# Patient Record
Sex: Male | Born: 1935 | Race: White | Hispanic: No | Marital: Married | Smoking: Never smoker
Health system: Southern US, Community
[De-identification: ages and names within clinical notes are randomized; demographics above are authoritative.]

## PROBLEM LIST (undated history)

## (undated) DIAGNOSIS — R262 Difficulty in walking, not elsewhere classified: Secondary | ICD-10-CM

## (undated) DIAGNOSIS — K5900 Constipation, unspecified: Secondary | ICD-10-CM

## (undated) DIAGNOSIS — F0280 Dementia in other diseases classified elsewhere without behavioral disturbance: Secondary | ICD-10-CM

## (undated) DIAGNOSIS — I639 Cerebral infarction, unspecified: Secondary | ICD-10-CM

## (undated) DIAGNOSIS — R1310 Dysphagia, unspecified: Secondary | ICD-10-CM

## (undated) DIAGNOSIS — I350 Nonrheumatic aortic (valve) stenosis: Secondary | ICD-10-CM

## (undated) DIAGNOSIS — G629 Polyneuropathy, unspecified: Secondary | ICD-10-CM

## (undated) DIAGNOSIS — G309 Alzheimer's disease, unspecified: Secondary | ICD-10-CM

## (undated) DIAGNOSIS — N289 Disorder of kidney and ureter, unspecified: Secondary | ICD-10-CM

## (undated) DIAGNOSIS — K222 Esophageal obstruction: Secondary | ICD-10-CM

## (undated) DIAGNOSIS — F419 Anxiety disorder, unspecified: Secondary | ICD-10-CM

## (undated) DIAGNOSIS — K219 Gastro-esophageal reflux disease without esophagitis: Secondary | ICD-10-CM

## (undated) DIAGNOSIS — K5790 Diverticulosis of intestine, part unspecified, without perforation or abscess without bleeding: Secondary | ICD-10-CM

## (undated) DIAGNOSIS — K4020 Bilateral inguinal hernia, without obstruction or gangrene, not specified as recurrent: Secondary | ICD-10-CM

## (undated) DIAGNOSIS — M109 Gout, unspecified: Secondary | ICD-10-CM

## (undated) DIAGNOSIS — I10 Essential (primary) hypertension: Secondary | ICD-10-CM

## (undated) DIAGNOSIS — I739 Peripheral vascular disease, unspecified: Secondary | ICD-10-CM

## (undated) DIAGNOSIS — E785 Hyperlipidemia, unspecified: Secondary | ICD-10-CM

## (undated) DIAGNOSIS — M1990 Unspecified osteoarthritis, unspecified site: Secondary | ICD-10-CM

## (undated) DIAGNOSIS — N400 Enlarged prostate without lower urinary tract symptoms: Secondary | ICD-10-CM

## (undated) HISTORY — DX: Diverticulosis of intestine, part unspecified, without perforation or abscess without bleeding: K57.90

## (undated) HISTORY — DX: Cerebral infarction, unspecified: I63.9

## (undated) HISTORY — PX: KIDNEY STONE SURGERY: SHX686

## (undated) HISTORY — PX: OTHER SURGICAL HISTORY: SHX169

## (undated) HISTORY — DX: Esophageal obstruction: K22.2

## (undated) HISTORY — DX: Essential (primary) hypertension: I10

## (undated) HISTORY — DX: Nonrheumatic aortic (valve) stenosis: I35.0

## (undated) HISTORY — DX: Bilateral inguinal hernia, without obstruction or gangrene, not specified as recurrent: K40.20

## (undated) HISTORY — DX: Hyperlipidemia, unspecified: E78.5

## (undated) HISTORY — DX: Polyneuropathy, unspecified: G62.9

## (undated) HISTORY — DX: Peripheral vascular disease, unspecified: I73.9

## (undated) HISTORY — PX: GROIN EXPLORATION: SHX1713

## (undated) HISTORY — DX: Gastro-esophageal reflux disease without esophagitis: K21.9

## (undated) HISTORY — DX: Alzheimer's disease, unspecified: G30.9

## (undated) HISTORY — DX: Dementia in other diseases classified elsewhere, unspecified severity, without behavioral disturbance, psychotic disturbance, mood disturbance, and anxiety: F02.80

## (undated) HISTORY — DX: Gout, unspecified: M10.9

## (undated) HISTORY — PX: INGUINAL HERNIA REPAIR: SUR1180

## (undated) HISTORY — DX: Disorder of kidney and ureter, unspecified: N28.9

## (undated) HISTORY — DX: Benign prostatic hyperplasia without lower urinary tract symptoms: N40.0

---

## 2002-05-01 ENCOUNTER — Ambulatory Visit (HOSPITAL_COMMUNITY): Admission: RE | Admit: 2002-05-01 | Discharge: 2002-05-01 | Payer: Self-pay | Admitting: Ophthalmology

## 2004-05-29 ENCOUNTER — Ambulatory Visit: Payer: Self-pay | Admitting: Internal Medicine

## 2004-11-16 ENCOUNTER — Ambulatory Visit: Payer: Self-pay | Admitting: Internal Medicine

## 2004-11-26 ENCOUNTER — Ambulatory Visit: Payer: Self-pay | Admitting: Internal Medicine

## 2005-05-27 ENCOUNTER — Ambulatory Visit: Payer: Self-pay | Admitting: Internal Medicine

## 2005-10-19 ENCOUNTER — Ambulatory Visit: Payer: Self-pay | Admitting: Internal Medicine

## 2005-11-01 ENCOUNTER — Inpatient Hospital Stay (HOSPITAL_COMMUNITY): Admission: EM | Admit: 2005-11-01 | Discharge: 2005-11-03 | Payer: Self-pay | Admitting: Emergency Medicine

## 2005-11-01 ENCOUNTER — Ambulatory Visit: Payer: Self-pay | Admitting: Internal Medicine

## 2005-11-02 ENCOUNTER — Ambulatory Visit: Payer: Self-pay | Admitting: Cardiology

## 2005-11-02 ENCOUNTER — Encounter: Payer: Self-pay | Admitting: Cardiology

## 2005-11-09 ENCOUNTER — Ambulatory Visit: Payer: Self-pay | Admitting: Internal Medicine

## 2005-11-23 ENCOUNTER — Inpatient Hospital Stay (HOSPITAL_COMMUNITY): Admission: AD | Admit: 2005-11-23 | Discharge: 2005-11-25 | Payer: Self-pay | Admitting: Interventional Radiology

## 2006-01-24 ENCOUNTER — Ambulatory Visit: Payer: Self-pay | Admitting: Internal Medicine

## 2006-02-25 ENCOUNTER — Ambulatory Visit (HOSPITAL_COMMUNITY): Admission: RE | Admit: 2006-02-25 | Discharge: 2006-02-25 | Payer: Self-pay | Admitting: Interventional Radiology

## 2006-03-21 ENCOUNTER — Ambulatory Visit: Payer: Self-pay | Admitting: Internal Medicine

## 2006-03-25 ENCOUNTER — Ambulatory Visit: Payer: Self-pay | Admitting: Gastroenterology

## 2006-04-01 ENCOUNTER — Ambulatory Visit (HOSPITAL_COMMUNITY): Admission: RE | Admit: 2006-04-01 | Discharge: 2006-04-01 | Payer: Self-pay | Admitting: Gastroenterology

## 2006-04-18 ENCOUNTER — Ambulatory Visit (HOSPITAL_COMMUNITY): Admission: RE | Admit: 2006-04-18 | Discharge: 2006-04-18 | Payer: Self-pay | Admitting: Gastroenterology

## 2006-04-26 ENCOUNTER — Ambulatory Visit: Payer: Self-pay | Admitting: Internal Medicine

## 2006-06-07 ENCOUNTER — Ambulatory Visit: Payer: Self-pay | Admitting: Gastroenterology

## 2006-06-22 ENCOUNTER — Ambulatory Visit: Payer: Self-pay | Admitting: Gastroenterology

## 2006-07-22 ENCOUNTER — Ambulatory Visit: Payer: Self-pay | Admitting: Gastroenterology

## 2006-07-29 ENCOUNTER — Encounter: Admission: RE | Admit: 2006-07-29 | Discharge: 2006-07-29 | Payer: Self-pay | Admitting: Orthopedic Surgery

## 2006-08-04 ENCOUNTER — Ambulatory Visit: Payer: Self-pay | Admitting: Internal Medicine

## 2006-08-19 ENCOUNTER — Encounter: Admission: RE | Admit: 2006-08-19 | Discharge: 2006-08-19 | Payer: Self-pay | Admitting: Orthopedic Surgery

## 2007-02-07 ENCOUNTER — Ambulatory Visit: Payer: Self-pay | Admitting: Internal Medicine

## 2007-02-07 DIAGNOSIS — I739 Peripheral vascular disease, unspecified: Secondary | ICD-10-CM

## 2007-02-07 DIAGNOSIS — I10 Essential (primary) hypertension: Secondary | ICD-10-CM | POA: Insufficient documentation

## 2007-02-07 DIAGNOSIS — N259 Disorder resulting from impaired renal tubular function, unspecified: Secondary | ICD-10-CM | POA: Insufficient documentation

## 2007-02-07 DIAGNOSIS — K5730 Diverticulosis of large intestine without perforation or abscess without bleeding: Secondary | ICD-10-CM | POA: Insufficient documentation

## 2007-02-07 DIAGNOSIS — E119 Type 2 diabetes mellitus without complications: Secondary | ICD-10-CM

## 2007-02-07 DIAGNOSIS — Z8679 Personal history of other diseases of the circulatory system: Secondary | ICD-10-CM | POA: Insufficient documentation

## 2007-02-07 LAB — CONVERTED CEMR LAB
Albumin: 3.9 g/dL (ref 3.5–5.2)
CO2: 26 meq/L (ref 19–32)
Calcium: 9.2 mg/dL (ref 8.4–10.5)
Chloride: 108 meq/L (ref 96–112)
Eosinophils Absolute: 0.2 10*3/uL (ref 0.0–0.6)
GFR calc Af Amer: 53 mL/min
Glucose, Bld: 84 mg/dL (ref 70–99)
HDL: 28.9 mg/dL — ABNORMAL LOW (ref >39.0)
Hgb A1c MFr Bld: 6.6 % — ABNORMAL HIGH (ref 4.6–6.0)
Monocytes Absolute: 0.8 10*3/uL — ABNORMAL HIGH (ref 0.2–0.7)
Neutro Abs: 6.7 10*3/uL (ref 1.4–7.7)
Neutrophils Relative %: 64.6 % (ref 43.0–77.0)
RBC: 4.44 M/uL (ref 4.22–5.81)
Total Bilirubin: 1 mg/dL (ref 0.3–1.2)
VLDL: 23 mg/dL (ref 0–40)
WBC: 10.5 10*3/uL (ref 4.5–10.5)

## 2007-02-16 ENCOUNTER — Encounter: Payer: Self-pay | Admitting: Internal Medicine

## 2007-06-09 ENCOUNTER — Ambulatory Visit: Payer: Self-pay | Admitting: Internal Medicine

## 2007-06-09 DIAGNOSIS — E785 Hyperlipidemia, unspecified: Secondary | ICD-10-CM

## 2007-06-09 LAB — CONVERTED CEMR LAB
AST: 18 units/L (ref 0–37)
Albumin: 4.3 g/dL (ref 3.5–5.2)
Bilirubin, Direct: 0.2 mg/dL (ref 0.0–0.3)
CRP, High Sensitivity: <1 — ABNORMAL LOW (ref 0.00–5.00)
Calcium: 9.8 mg/dL (ref 8.4–10.5)
Cholesterol: 129 mg/dL (ref 0–200)
Creatinine, Ser: 1.7 mg/dL — ABNORMAL HIGH (ref 0.4–1.5)
GFR calc Af Amer: 49 mL/min
Glucose, Bld: 74 mg/dL (ref 70–99)
LDL Cholesterol: 73 mg/dL (ref 0–99)
Sodium: 142 meq/L (ref 135–145)
Total Bilirubin: 1 mg/dL (ref 0.3–1.2)
VLDL: 27 mg/dL (ref 0–40)

## 2007-11-17 ENCOUNTER — Encounter: Payer: Self-pay | Admitting: Interventional Radiology

## 2007-11-20 ENCOUNTER — Ambulatory Visit (HOSPITAL_COMMUNITY): Admission: RE | Admit: 2007-11-20 | Discharge: 2007-11-20 | Payer: Self-pay | Admitting: Interventional Radiology

## 2007-11-23 ENCOUNTER — Ambulatory Visit (HOSPITAL_COMMUNITY): Admission: RE | Admit: 2007-11-23 | Discharge: 2007-11-23 | Payer: Self-pay | Admitting: Interventional Radiology

## 2007-12-01 ENCOUNTER — Ambulatory Visit (HOSPITAL_COMMUNITY): Admission: RE | Admit: 2007-12-01 | Discharge: 2007-12-01 | Payer: Self-pay | Admitting: Interventional Radiology

## 2007-12-11 ENCOUNTER — Ambulatory Visit: Payer: Self-pay | Admitting: Internal Medicine

## 2007-12-11 LAB — CONVERTED CEMR LAB
CO2: 29 meq/L (ref 19–32)
Calcium: 9.8 mg/dL (ref 8.4–10.5)
Chloride: 112 meq/L (ref 96–112)
Cholesterol: 143 mg/dL (ref 0–200)
HDL: 26.9 mg/dL — ABNORMAL LOW (ref >39.0)
Hgb A1c MFr Bld: 6.5 % — ABNORMAL HIGH (ref 4.6–6.0)
Triglycerides: 144 mg/dL (ref 0–149)
VLDL: 29 mg/dL (ref 0–40)

## 2008-02-13 ENCOUNTER — Encounter: Payer: Self-pay | Admitting: Internal Medicine

## 2008-03-18 ENCOUNTER — Telehealth: Payer: Self-pay | Admitting: *Deleted

## 2008-06-07 ENCOUNTER — Ambulatory Visit: Payer: Self-pay | Admitting: Internal Medicine

## 2008-06-07 DIAGNOSIS — H3530 Unspecified macular degeneration: Secondary | ICD-10-CM | POA: Insufficient documentation

## 2008-06-07 DIAGNOSIS — R972 Elevated prostate specific antigen [PSA]: Secondary | ICD-10-CM

## 2008-06-07 LAB — CONVERTED CEMR LAB
BUN: 21 mg/dL (ref 6–23)
CO2: 28 meq/L (ref 19–32)
Calcium: 9.4 mg/dL (ref 8.4–10.5)
Cholesterol: 149 mg/dL (ref 0–200)
HDL: 44.7 mg/dL (ref >39.0)
Hgb A1c MFr Bld: 6.7 % — ABNORMAL HIGH (ref 4.6–6.0)
LDL Cholesterol: 86 mg/dL (ref 0–99)
Triglycerides: 90 mg/dL (ref 0–149)
VLDL: 18 mg/dL (ref 0–40)

## 2008-11-20 ENCOUNTER — Telehealth: Payer: Self-pay | Admitting: Gastroenterology

## 2008-11-21 ENCOUNTER — Ambulatory Visit: Payer: Self-pay | Admitting: Internal Medicine

## 2008-11-21 ENCOUNTER — Encounter: Payer: Self-pay | Admitting: Gastroenterology

## 2008-11-21 DIAGNOSIS — K219 Gastro-esophageal reflux disease without esophagitis: Secondary | ICD-10-CM

## 2008-11-21 DIAGNOSIS — Z860100 Personal history of colon polyps, unspecified: Secondary | ICD-10-CM | POA: Insufficient documentation

## 2008-11-21 DIAGNOSIS — I6350 Cerebral infarction due to unspecified occlusion or stenosis of unspecified cerebral artery: Secondary | ICD-10-CM | POA: Insufficient documentation

## 2008-11-21 DIAGNOSIS — R1319 Other dysphagia: Secondary | ICD-10-CM | POA: Insufficient documentation

## 2008-11-22 ENCOUNTER — Encounter: Payer: Self-pay | Admitting: Gastroenterology

## 2008-11-22 ENCOUNTER — Ambulatory Visit (HOSPITAL_COMMUNITY): Admission: RE | Admit: 2008-11-22 | Discharge: 2008-11-22 | Payer: Self-pay | Admitting: Internal Medicine

## 2008-11-22 ENCOUNTER — Telehealth: Payer: Self-pay | Admitting: Physician Assistant

## 2008-12-06 ENCOUNTER — Ambulatory Visit: Payer: Self-pay | Admitting: Gastroenterology

## 2008-12-06 ENCOUNTER — Ambulatory Visit: Payer: Self-pay | Admitting: Internal Medicine

## 2008-12-06 LAB — CONVERTED CEMR LAB
AST: 16 units/L (ref 0–37)
BUN: 20 mg/dL (ref 6–23)
Basophils Absolute: 0.1 10*3/uL (ref 0.0–0.1)
Basophils Relative: 1 % (ref 0.0–3.0)
Calcium: 9.4 mg/dL (ref 8.4–10.5)
Cholesterol: 179 mg/dL (ref 0–200)
Creatinine, Ser: 1.4 mg/dL (ref 0.4–1.5)
Eosinophils Absolute: 0.2 10*3/uL (ref 0.0–0.7)
Eosinophils Relative: 1.7 % (ref 0.0–5.0)
Glucose, Bld: 123 mg/dL — ABNORMAL HIGH (ref 70–99)
Lymphocytes Relative: 23.1 % (ref 12.0–46.0)
Lymphs Abs: 2.8 10*3/uL (ref 0.7–4.0)
MCHC: 34 g/dL (ref 30.0–36.0)
Monocytes Absolute: 0.9 10*3/uL (ref 0.1–1.0)
Monocytes Relative: 7.7 % (ref 3.0–12.0)
Neutro Abs: 8 10*3/uL — ABNORMAL HIGH (ref 1.4–7.7)
Platelets: 191 10*3/uL (ref 150.0–400.0)
RDW: 12.7 % (ref 11.5–14.6)
Sodium: 143 meq/L (ref 135–145)
Total Bilirubin: 0.9 mg/dL (ref 0.3–1.2)
VLDL: 15 mg/dL (ref 0.0–40.0)
WBC: 12 10*3/uL — ABNORMAL HIGH (ref 4.5–10.5)

## 2009-02-14 ENCOUNTER — Encounter: Payer: Self-pay | Admitting: Internal Medicine

## 2009-05-30 ENCOUNTER — Encounter (INDEPENDENT_AMBULATORY_CARE_PROVIDER_SITE_OTHER): Payer: Self-pay | Admitting: *Deleted

## 2009-06-16 ENCOUNTER — Telehealth: Payer: Self-pay | Admitting: Internal Medicine

## 2009-06-16 DIAGNOSIS — M545 Low back pain: Secondary | ICD-10-CM

## 2009-06-18 ENCOUNTER — Encounter (INDEPENDENT_AMBULATORY_CARE_PROVIDER_SITE_OTHER): Payer: Self-pay | Admitting: *Deleted

## 2009-06-23 ENCOUNTER — Ambulatory Visit: Payer: Self-pay | Admitting: Internal Medicine

## 2009-07-08 ENCOUNTER — Encounter: Admission: RE | Admit: 2009-07-08 | Discharge: 2009-07-08 | Payer: Self-pay | Admitting: Orthopedic Surgery

## 2009-07-15 ENCOUNTER — Ambulatory Visit: Payer: Self-pay | Admitting: Internal Medicine

## 2009-11-03 ENCOUNTER — Telehealth: Payer: Self-pay | Admitting: Internal Medicine

## 2009-11-24 ENCOUNTER — Ambulatory Visit: Payer: Self-pay | Admitting: Internal Medicine

## 2009-11-24 LAB — CONVERTED CEMR LAB
BUN: 17 mg/dL (ref 6–23)
Basophils Absolute: 0.1 10*3/uL (ref 0.0–0.1)
Chloride: 108 meq/L (ref 96–112)
Cholesterol: 146 mg/dL (ref 0–200)
Direct LDL: 85.2 mg/dL
Eosinophils Absolute: 0.3 10*3/uL (ref 0.0–0.7)
GFR calc non Af Amer: 54.8 mL/min (ref >60)
HDL: 40 mg/dL (ref >39.00)
Hemoglobin: 14.1 g/dL (ref 13.0–17.0)
Hgb A1c MFr Bld: 6.3 % (ref 4.6–6.5)
MCV: 91.8 fL (ref 78.0–100.0)
Monocytes Relative: 7.6 % (ref 3.0–12.0)
Neutrophils Relative %: 60.5 % (ref 43.0–77.0)
RBC: 4.56 M/uL (ref 4.22–5.81)
RDW: 13.4 % (ref 11.5–14.6)
TSH: 2.08 microintl units/mL (ref 0.35–5.50)
WBC: 10.2 10*3/uL (ref 4.5–10.5)

## 2009-11-24 LAB — HM DIABETES FOOT EXAM

## 2010-03-10 ENCOUNTER — Encounter: Payer: Self-pay | Admitting: Internal Medicine

## 2010-04-10 ENCOUNTER — Encounter: Payer: Self-pay | Admitting: Internal Medicine

## 2010-04-28 ENCOUNTER — Ambulatory Visit: Payer: Self-pay | Admitting: Family Medicine

## 2010-04-28 ENCOUNTER — Ambulatory Visit: Payer: Self-pay | Admitting: Internal Medicine

## 2010-04-28 ENCOUNTER — Telehealth: Payer: Self-pay | Admitting: Internal Medicine

## 2010-04-28 DIAGNOSIS — J209 Acute bronchitis, unspecified: Secondary | ICD-10-CM | POA: Insufficient documentation

## 2010-06-08 ENCOUNTER — Ambulatory Visit: Payer: Self-pay | Admitting: Internal Medicine

## 2010-06-08 DIAGNOSIS — I6322 Cerebral infarction due to unspecified occlusion or stenosis of basilar arteries: Secondary | ICD-10-CM

## 2010-06-08 LAB — CONVERTED CEMR LAB
Calcium: 9.6 mg/dL (ref 8.4–10.5)
Chloride: 106 meq/L (ref 96–112)
Direct LDL: 118.9 mg/dL
GFR calc non Af Amer: 58.35 mL/min (ref >60)
Hgb A1c MFr Bld: 6.2 % (ref 4.6–6.5)
Potassium: 4.6 meq/L (ref 3.5–5.1)
Sodium: 141 meq/L (ref 135–145)

## 2010-06-12 ENCOUNTER — Encounter: Payer: Self-pay | Admitting: Internal Medicine

## 2010-06-12 ENCOUNTER — Encounter (INDEPENDENT_AMBULATORY_CARE_PROVIDER_SITE_OTHER): Payer: Self-pay | Admitting: *Deleted

## 2010-06-12 ENCOUNTER — Telehealth (INDEPENDENT_AMBULATORY_CARE_PROVIDER_SITE_OTHER): Payer: Self-pay | Admitting: *Deleted

## 2010-06-12 ENCOUNTER — Encounter: Payer: Self-pay | Admitting: Gastroenterology

## 2010-07-02 ENCOUNTER — Encounter: Payer: Self-pay | Admitting: Gastroenterology

## 2010-07-02 ENCOUNTER — Ambulatory Visit (HOSPITAL_COMMUNITY)
Admission: RE | Admit: 2010-07-02 | Discharge: 2010-07-02 | Payer: Self-pay | Source: Home / Self Care | Attending: Gastroenterology | Admitting: Gastroenterology

## 2010-07-28 ENCOUNTER — Telehealth: Payer: Self-pay | Admitting: Internal Medicine

## 2010-07-28 ENCOUNTER — Ambulatory Visit
Admission: RE | Admit: 2010-07-28 | Discharge: 2010-07-28 | Payer: Self-pay | Source: Home / Self Care | Attending: Internal Medicine | Admitting: Internal Medicine

## 2010-07-28 DIAGNOSIS — M109 Gout, unspecified: Secondary | ICD-10-CM | POA: Insufficient documentation

## 2010-07-28 LAB — CONVERTED CEMR LAB: Blood Glucose, Fingerstick: 99

## 2010-08-16 ENCOUNTER — Encounter: Payer: Self-pay | Admitting: Interventional Radiology

## 2010-08-27 NOTE — Medication Information (Signed)
Summary: Order for Diabetic Testing Supplies  Order for Diabetic Testing Supplies   Imported By: Maryln Gottron 04/16/2010 15:10:26  _____________________________________________________________________  External Attachment:    Type:   Image     Comment:   External Document

## 2010-08-27 NOTE — Letter (Signed)
Summary: Anticoagulation/[facility]  Anticoagulation/[facility]   Imported By: Sherian Rein 06/17/2010 12:39:45  _____________________________________________________________________  External Attachment:    Type:   Image     Comment:   External Document

## 2010-08-27 NOTE — Progress Notes (Signed)
Summary: EGD  Phone Note Outgoing Call   Call placed by: Chales Abrahams CMA Duncan Dull),  June 12, 2010 9:38 AM Summary of Call: pt sceduled for EGD [facility]  reviewed  meds and instructed  pt  instructions mailed to the pt Initial call taken by: Chales Abrahams CMA Duncan Dull),  June 12, 2010 9:39 AM

## 2010-08-27 NOTE — Letter (Signed)
Summary: Diabetic Instructions  [facility]  [address] [place], [place] [address]   Phone: [phone]  Fax: [phone]    Don Montoya [birth year] MRN: [id number]   X   ORAL DIABETIC MEDICATION INSTRUCTIONS  The day before your procedure:   Take your diabetic pill as you do normally  The day of your procedure:   Do not take your diabetic pill    We will check your blood sugar levels during the admission process and again in Recovery before discharging you home  ________________________________________________________________________

## 2010-08-27 NOTE — Assessment & Plan Note (Signed)
Summary: ankle swollen and painful- cant walk/bmw   Vital Signs:  Patient profile:   77 year old male Weight:      147 pounds BP sitting:   140 / 70  (right arm) Cuff size:   regular  Vitals Entered By: Duard Brady LPN (July 28, 2010 4:08 PM) CC: c/o (R) ankle swelling - no fall no injury , also c/o coldness Is Patient Diabetic? Yes Did you bring your meter with you today? No CBG Result 99   Primary Care Provider:  Stacie Glaze MD  CC:  c/o (R) ankle swelling - no fall no injury  and also c/o coldness.  History of Present Illness: 19 and-year-old patient, who presents with a several day history of painswelling, and involving his right ankle.  There has been no trauma.  He does have a history of diabetes as well as renal insufficiency.  He has remote history of gout, but not in a number of years.  Denies any fever or chills.  For the past several days, his wife states that he has been chronically cold  Allergies (verified): No Known Drug Allergies  Past History:  Past Medical History: Reviewed history from 11/21/2008 and no changes required. Diabetes mellitus, type II Diverticulosis, colon Hypertension Peripheral vascular disease Renal insufficiency Cerebrovascular accident, hx ofS/P VERTEBRAL ARTERY STENT 2007 Hyperlipidemia GERD Constipation  Review of Systems       The patient complains of difficulty walking.  The patient denies anorexia, fever, weight loss, weight gain, vision loss, decreased hearing, hoarseness, chest pain, syncope, dyspnea on exertion, peripheral edema, prolonged cough, headaches, hemoptysis, abdominal pain, melena, hematochezia, severe indigestion/heartburn, hematuria, incontinence, genital sores, muscle weakness, suspicious skin lesions, transient blindness, depression, unusual weight change, abnormal bleeding, enlarged lymph nodes, angioedema, breast masses, and testicular masses.         recent endoscopy with esophageal  dilatation  Physical Exam  General:  elderly alert, no distress at rest.  Afebrile.  Blood pressure 140/7.  Random blood sugar 99 Extremities:  the right ankle is slightly swollen quite warm to touch and slightly tender   Impression & Recommendations:  Problem # 1:  GOUT, UNSPECIFIED (ICD-274.9)  Problem # 2:  RENAL INSUFFICIENCY (ICD-588.9)  Complete Medication List: 1)  Diabeta 2.5 Mg Tabs (Glyburide) .... One by mouth daily 2)  Adult Aspirin Low Strength 81 Mg Tbdp (Aspirin) .... One by mouth daily 3)  Plavix 75 Mg Tabs (Clopidogrel bisulfate) .... One by mouth daily 4)  Simvastatin 80 Mg Tabs (Simvastatin) .... 1/2 once daily 5)  Icaps Caps (Multiple vitamins-minerals) .Marland Kitchen.. 1 two times a day 6)  Coreg Cr 10 Mg Xr24h-cap (Carvedilol phosphate) .... One by mouth daily 7)  Omeprazole 20 Mg Cpdr (Omeprazole) .... Qd 8)  Prednisone 10 Mg Tabs (Prednisone) .... One twice daily 9)  Hydrocodone-acetaminophen 5-500 Mg Tabs (Hydrocodone-acetaminophen) .... One every 6 hours for pain  Other Orders: Capillary Blood Glucose/CBG ([address]) Prescription Created Electronically [id number])  Patient Instructions: 1)  Please schedule a follow-up appointment as needed. 2)  Limit your Sodium (Salt) to less than 2 grams a day(slightly less than 1/2 a teaspoon) to prevent fluid retention, swelling, or worsening of symptom 3)  take medications as directed Prescriptions: PREDNISONE 10 MG TABS (PREDNISONE) one twice daily  #14 x 0   Entered and Authorized by:   Gordy Savers  MD   Signed by:   Gordy Savers  MD on 07/28/2010   Method used:   Electronically to  [facility]  [address] # [id number]* (retail)       [address]       [place], [place]  [address]       Ph: [phone] or [phone]       Fax: [phone]   RxID:   [id number] HYDROCODONE-ACETAMINOPHEN 5-500 MG TABS (HYDROCODONE-ACETAMINOPHEN) one every 6 hours for pain  #30 x 0   Entered and  Authorized by:   Gordy Savers  MD   Signed by:   Gordy Savers  MD on 07/28/2010   Method used:   Print then Give to Patient   RxID:   [id number] PREDNISONE 10 MG TABS (PREDNISONE) one twice daily  #14 x 0   Entered and Authorized by:   Gordy Savers  MD   Signed by:   Gordy Savers  MD on 07/28/2010   Method used:   Print then Give to Patient   RxID:   [id number]    Orders Added: 1)  Capillary Blood Glucose/CBG [84166] 2)  Est. Patient Level III [[address]] 3)  Prescription Created Electronically [phone]

## 2010-08-27 NOTE — Procedures (Signed)
Summary: Upper Endoscopy  Patient: [name] Note: All result statuses are Final unless otherwise noted.  Tests: (1) Upper Endoscopy (EGD)   EGD Upper Endoscopy       DONE     [facility]     [address] [place], [place]  [address]           ENDOSCOPY PROCEDURE REPORT           PATIENT:  Don Montoya, Don Montoya  MR#:  [id number]     BIRTHDATE:  28-Apr-1915, 89 yrs. 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  The  endoscope was introduced through the     mouth and advanced to the second portion of the duodenum, without     limitations.  The instrument was slowly withdrawn as the mucosa     was fully examined.     <<PROCEDUREIMAGES>>           There was a thick Schatzki's type ring at GE junction above a 2cm     hiatal hernia. The ring was dilated with a 19mm CRE Balloon with     usual minor mucosal tear and self limited ooozing (see image5,     image7, and image9).  Otherwise the examination was normal (see     image2, image3, image4, and image6).    Retroflexed views revealed     no abnormalities.    The scope was then withdrawn from the patient     and the procedure completed.           COMPLICATIONS:  None           ENDOSCOPIC IMPRESSION:     1) Thick Schatzki's ring, dilated up to 19mm with CRE Balloon     2) Small hiatal hernia     3) Otherwise normal examination           RECOMMENDATIONS:     Ok to resume plavix tomorrow.     Repeat dilation as needed.        ______________________________     Rachael Fee, MD           cc: Darryll Capers, MD           n.     Rosalie Doctor:   Rachael Fee at 07/02/2010 08:27 AM           [name], [id number]  Note: An exclamation mark (!) indicates a result that was not dispersed into the flowsheet. Document Creation Date: 07/02/2010 8:28 AM _______________________________________________________________________  (1) Order result status: Final Collection or observation date-time: 07/02/2010 08:22 Requested date-time:  Receipt date-time:  Reported date-time:  Referring Physician:   Ordering Physician: Rob Bunting [phone]) Specimen Source:  Source: Launa Grill Order Number: [id number] Lab site:

## 2010-08-27 NOTE — Assessment & Plan Note (Signed)
Summary: 4 mo rov/mm/pt rescd from bump//ccm   Vital Signs:  Patient profile:   77 year old male Height:      67 inches Weight:      156 pounds BMI:     24.52 Temp:     98.2 degrees F oral Pulse rate:   76 / minute Resp:     14 per minute BP sitting:   140 / 80  (left arm)  Vitals Entered By: Willy Eddy, LPN (Nov 25, 979 10:24 AM) CC: roa, Hypertension Management  Does patient need assistance? Functional Status Self care, Social activities Ambulation Normal, Impaired:Risk for fall Comments due to mild negligence   Primary Care Provider:  Karin Golden, MD  CC:  roa and Hypertension Management.  History of Present Illness: DM follow up with goof CBG's int the 83 range blood pressure are also better at home in the 130  /70 range GERD stable eye exam up to date for macular degeneration ( and DM)  Hypertension History:      He denies headache, chest pain, palpitations, dyspnea with exertion, orthopnea, PND, peripheral edema, visual symptoms, neurologic problems, syncope, and side effects from treatment.        Positive major cardiovascular risk factors include male age 34 years old or older, diabetes, hyperlipidemia, and hypertension.  Negative major cardiovascular risk factors include non-tobacco-user status.        Positive history for target organ damage include prior stroke (or TIA) and peripheral vascular disease.     Problems Prior to Update: 1)  Low Back Pain, Acute  (ICD-724.2) 2)  Personal Hx Colonic Polyps  (ICD-V12.72) 3)  Cva-stroke  (ICD-434.91) 4)  Gerd  (ICD-530.81) 5)  Cva-stroke  (ICD-434.91) 6)  Personal Hx Colonic Polyps  (ICD-V12.72) 7)  Diverticulosis-colon  (ICD-562.10) 8)  Gerd  (ICD-530.81) 9)  Dysphagia  (ICD-787.29) 10)  Gerd  (ICD-530.81) 11)  Dysphagia Unspecified  (ICD-787.20) 12)  Elevated Prostate Specific Antigen  (ICD-790.93) 13)  Macular Degeneration of Retina Unspecified  (ICD-362.50) 14)  Hyperlipidemia  (ICD-272.4) 15)   Family History Diabetes 1st Degree Relative  (ICD-V18.0) 16)  Family History of Cad Male 1st Degree Relative <50  (ICD-V17.3) 17)  Cerebrovascular Accident, Hx of  (ICD-V12.50) 18)  Renal Insufficiency  (ICD-588.9) 19)  Peripheral Vascular Disease  (ICD-443.9) 20)  Hypertension  (ICD-401.9) 21)  Diverticulosis, Colon  (ICD-562.10) 22)  Diabetes Mellitus, Type II  (ICD-250.00)  Medications Prior to Update: 1)  Diabeta 2.5 Mg  Tabs (Glyburide) .... One By Mouth Daily 2)  Adult Aspirin Low Strength 81 Mg  Tbdp (Aspirin) .... One By Mouth Daily 3)  Plavix 75 Mg  Tabs (Clopidogrel Bisulfate) .... One By Mouth Daily 4)  Prilosec 20 Mg  Cpdr (Omeprazole) .... One By Mouth 30 Min Prior To Food Daily 5)  Simvastatin 80 Mg  Tabs (Simvastatin) .... 1/2 Once Daily 6)  Icaps  Caps (Multiple Vitamins-Minerals) .Marland Kitchen.. 1 Two Times A Day 7)  Prednisone 10 Mg Tabs (Prednisone) .... Dose Pack- Take As Directed 8)  Tramadol Hcl 50 Mg Tabs (Tramadol Hcl) .... One Two Two By Mouth Every 6 Hours As Needed For Pain 9)  Diazepam 2 Mg Tabs (Diazepam) .... One By Mouth Three Times A Day As Need For Back Pain and Anxiety 10)  Coreg Cr 10 Mg Xr24h-Cap (Carvedilol Phosphate) .... One By Mouth Daily 11)  Zithromax Z-Pak 250 Mg Tabs (Azithromycin) .... As Directed 12)  Atuss Ds 30-4-30 Mg/15ml Susp (Pseudoephed Hcl-Cpm-Dm Hbr  Tan) .... 2 Teaspoons By Mouth Bid  Current Medications (verified): 1)  Diabeta 2.5 Mg  Tabs (Glyburide) .... One By Mouth Daily 2)  Adult Aspirin Low Strength 81 Mg  Tbdp (Aspirin) .... One By Mouth Daily 3)  Plavix 75 Mg  Tabs (Clopidogrel Bisulfate) .... One By Mouth Daily 4)  Prilosec 20 Mg  Cpdr (Omeprazole) .... One By Mouth 30 Min Prior To Food Daily 5)  Simvastatin 80 Mg  Tabs (Simvastatin) .... 1/2 Once Daily 6)  Icaps  Caps (Multiple Vitamins-Minerals) .Marland Kitchen.. 1 Two Times A Day 7)  Coreg Cr 10 Mg Xr24h-Cap (Carvedilol Phosphate) .... One By Mouth Daily  Allergies (verified): No Known Drug  Allergies  Past History:  Family History: Last updated: 02/07/2007 Family History of CAD Male 1st degree relative <50 Family History Diabetes 1st degree relative  Social History: Last updated: 11/21/2008 Retired Married Never Smoked Daily Caffeine Use-2 Illicit Drug Use - no  Risk Factors: Smoking Status: never (07/15/2009)  Past medical, surgical, family and social histories (including risk factors) reviewed, and no changes noted (except as noted below).  Past Medical History: Reviewed history from 11/21/2008 and no changes required. Diabetes mellitus, type II Diverticulosis, colon Hypertension Peripheral vascular disease Renal insufficiency Cerebrovascular accident, hx ofS/P VERTEBRAL ARTERY STENT 2007 Hyperlipidemia GERD Constipation  Past Surgical History: Reviewed history from 02/07/2007 and no changes required. GU surgery kidney stones vertibral artery stent  Family History: Reviewed history from 02/07/2007 and no changes required. Family History of CAD Male 1st degree relative <50 Family History Diabetes 1st degree relative  Social History: Reviewed history from 11/21/2008 and no changes required. Retired Married Never Smoked Daily Caffeine Use-2 Illicit Drug Use - no  Review of Systems  The patient denies anorexia, fever, weight loss, weight gain, vision loss, decreased hearing, hoarseness, chest pain, syncope, dyspnea on exertion, peripheral edema, prolonged cough, headaches, hemoptysis, abdominal pain, melena, hematochezia, severe indigestion/heartburn, hematuria, incontinence, genital sores, muscle weakness, suspicious skin lesions, transient blindness, difficulty walking, depression, unusual weight change, abnormal bleeding, enlarged lymph nodes, angioedema, and breast masses.    Physical Exam  General:  alert and well-developed.   Head:  Normocephalic and atraumatic.male-pattern balding.   Eyes:  pupils equal and pupils round.   Ears:  R ear  normal and L ear normal.   Nose:  no nasal discharge.   Neck:  No deformities, masses, or tenderness noted. Lungs:  Clear throughout to auscultation. Heart:  normal rate, regular rhythm, and Grade  1 /6 systolic ejection murmur.   Abdomen:  soft.  MODERATLY TENDER WITH GOOD BS  Diabetes Management Exam:    Foot Exam (with socks and/or shoes not present):       Sensory-Pinprick/Light touch:          Left medial foot (L-4): diminished          Left dorsal foot (L-5): diminished          Left lateral foot (S-1): diminished          Right medial foot (L-4): diminished          Right dorsal foot (L-5): diminished          Right lateral foot (S-1): diminished    Eye Exam:       Eye Exam done elsewhere          Date: 09/23/2009          Results: normal          Done by: rankin/groat  Impression & Recommendations:  Problem # 1:  CVA-STROKE (ICD-434.91) on plavie with good blood presure control His updated medication list for this problem includes:    Adult Aspirin Low Strength 81 Mg Tbdp (Aspirin) ..... One by mouth daily    Plavix 75 Mg Tabs (Clopidogrel bisulfate) ..... One by mouth daily  Problem # 2:  HYPERLIPIDEMIA (ICD-272.4)  due lipids His updated medication list for this problem includes:    Simvastatin 80 Mg Tabs (Simvastatin) .Marland Kitchen... 1/2 once daily  Labs Reviewed: SGOT: 16 (12/06/2008)   SGPT: 9 (12/06/2008)  10 Yr Risk Heart Disease: 47 % Prior 10 Yr Risk Heart Disease: 40 % (07/15/2009)   HDL:39.20 (12/06/2008), 44.7 (06/07/2008)  LDL:125 (12/06/2008), 86 (16/04/9603)  Chol:179 (12/06/2008), 149 (06/07/2008)  Trig:75.0 (12/06/2008), 90 (06/07/2008)  Orders: TLB-Cholesterol, HDL (83718-HDL) TLB-Cholesterol, Direct LDL (83721-DIRLDL) TLB-Cholesterol, Total (82465-CHO) TLB-TSH (Thyroid Stimulating Hormone) (84443-TSH)  Complete Medication List: 1)  Diabeta 2.5 Mg Tabs (Glyburide) .... One by mouth daily 2)  Adult Aspirin Low Strength 81 Mg Tbdp (Aspirin) .... One  by mouth daily 3)  Plavix 75 Mg Tabs (Clopidogrel bisulfate) .... One by mouth daily 4)  Simvastatin 80 Mg Tabs (Simvastatin) .... 1/2 once daily 5)  Icaps Caps (Multiple vitamins-minerals) .Marland Kitchen.. 1 two times a day 6)  Coreg Cr 10 Mg Xr24h-cap (Carvedilol phosphate) .... One by mouth daily  Other Orders: Venipuncture ([address]) TLB-BMP (Basic Metabolic Panel-BMET) (80048-METABOL) TLB-A1C / Hgb A1C (Glycohemoglobin) (83036-A1C) TLB-CBC Platelet - w/Differential (85025-CBCD) Prescription Created Electronically [id number])  Hypertension Assessment/Plan:      The patient's hypertensive risk group is category C: Target organ damage and/or diabetes.  His calculated 10 year risk of coronary heart disease is 47 %.  Today's blood pressure is 140/80.  His blood pressure goal is < 130/80.  Patient Instructions: 1)  Please schedule a follow-up appointment in 6 months. Prescriptions: COREG CR 10 MG XR24H-CAP (CARVEDILOL PHOSPHATE) one by mouth daily  #30 x 6   Entered and Authorized by:   Stacie Glaze MD   Signed by:   Stacie Glaze MD on 11/24/2009   Method used:   Electronically to        [facility]  [address] # 11350* (retail)       [address]       [place], [place]  [address]       Ph: [phone] or [phone]       Fax: [phone]   RxID:   [id number]

## 2010-08-27 NOTE — Progress Notes (Signed)
Summary: REQ FOR RX  Phone Note Call from Patient   Caller: Spouse Britta Mccreedy) [phone] Reason for Call: Talk to Nurse, Talk to Doctor Summary of Call: Pts wife Britta Mccreedy) called in to req that a Rx be sent in for her husband.... Pt c/o chest congestion, productive cough x 3 wks.Marland KitchenMarland KitchenMarland KitchenPt is exp fatigue due to ongoing cough.... Pt has been taking OTC meds (Robitussin) w/ little relief.... Pts wife is concerned that sxs persist then it may turn into pneumonia... Pts wife was offered OV for pt, but she said that the pt is scheduled to come in at the first of May... Pts wife adv that Rx can be sent to [facility] [address] - [place], [place].  Pts wife can be reached at [phone] with any questions or concerns.  Initial call taken by: Debbra Riding,  November 03, 2009 9:12 AM  Follow-up for Phone Call        zpack and atuss two tsp by mouth two times a day  Follow-up by: Stacie Glaze MD,  November 03, 2009 1:41 PM  Additional Follow-up for Phone Call Additional follow up Details #1::        Phone Call Completed Additional Follow-up by: Rudy Jew, RN,  November 03, 2009 1:57 PM    New/Updated Medications: ZITHROMAX Z-PAK 250 MG TABS (AZITHROMYCIN) As directed ATUSS DS 30-4-30 MG/5ML SUSP (PSEUDOEPHED HCL-CPM-DM HBR TAN) 2 teaspoons by mouth bid Prescriptions: ATUSS DS 30-4-30 MG/5ML SUSP (PSEUDOEPHED HCL-CPM-DM HBR TAN) 2 teaspoons by mouth bid  #6 ounces x 0   Entered by:   Rudy Jew, RN   Authorized by:   Stacie Glaze MD   Signed by:   Rudy Jew, RN on 11/03/2009   Method used:   Telephoned to ...       [facility]  [address] # [phone]* (retail)       [phone]       [place], [place]  [address]       Ph: [phone]       Fax: [phone]   RxID:   [id number] ZITHROMAX Z-PAK 250 MG TABS (AZITHROMYCIN) As directed  #1 x 0   Entered by:   Rudy Jew, RN   Authorized by:   Stacie Glaze MD   Signed by:   Rudy Jew, RN on 11/03/2009   Method used:   Telephoned to ...       [facility]  [address] # [phone]* (retail)       [phone] [address]       [place], [place]  [address]       Ph: [phone]       Fax: [phone]   RxID:   [id number]

## 2010-08-27 NOTE — Assessment & Plan Note (Signed)
Summary: uri/bmw   Vital Signs:  Patient profile:   77 year old male Height:      67 inches (170.18 cm) Weight:      150 pounds (68.18 kg) O2 Sat:      97 % on Room air Temp:     98.4 degrees F (36.89 degrees C) oral Pulse rate:   90 / minute BP sitting:   138 / 74  (left arm) Cuff size:   regular  Vitals Entered By: Josph Macho RMA (April 28, 2010 12:05 PM)  O2 Flow:  Room air CC: cough X5 days/ flu vaccination today/ CF Is Patient Diabetic? Yes   History of Present Illness: patient is a 77 year old Caucasian male in today with his wife for evaluation of the report he is generally healthy and in about 5 days ago began to have a sore throat. Over the next couple of days he developed worsening dry cough, malaise, anorexia, nasal congestion productive of clear sputum. He also notes some postnasal drip and some worsening chest tightness. I more trouble sleeping and his breathing is shallow more at night. During the day no wheezing but some shortness of breath with exertion is noted. No fevers, chills, headache, ear pain, palpitations, GI or GU complaints except for some mild anorexia  Current Medications (verified): 1)  Diabeta 2.5 Mg  Tabs (Glyburide) .... One By Mouth Daily 2)  Adult Aspirin Low Strength 81 Mg  Tbdp (Aspirin) .... One By Mouth Daily 3)  Plavix 75 Mg  Tabs (Clopidogrel Bisulfate) .... One By Mouth Daily 4)  Simvastatin 80 Mg  Tabs (Simvastatin) .... 1/2 Once Daily 5)  Icaps  Caps (Multiple Vitamins-Minerals) .Marland Kitchen.. 1 Two Times A Day 6)  Coreg Cr 10 Mg Xr24h-Cap (Carvedilol Phosphate) .... One By Mouth Daily  Allergies (verified): No Known Drug Allergies  Past History:  Past medical history reviewed for relevance to current acute and chronic problems. Social history (including risk factors) reviewed for relevance to current acute and chronic problems.  Past Medical History: Reviewed history from 11/21/2008 and no changes required. Diabetes mellitus, type  II Diverticulosis, colon Hypertension Peripheral vascular disease Renal insufficiency Cerebrovascular accident, hx ofS/P VERTEBRAL ARTERY STENT 2007 Hyperlipidemia GERD Constipation  Social History: Reviewed history from 11/21/2008 and no changes required. Retired Married Never Smoked Daily Caffeine Use-2 Illicit Drug Use - no  Review of Systems      See HPI       Flu Vaccine Consent Questions     Do you have a history of severe allergic reactions to this vaccine? no    Any prior history of allergic reactions to egg and/or gelatin? no    Do you have a sensitivity to the preservative Thimersol? no    Do you have a past history of Guillan-Barre Syndrome? no    Do you currently have an acute febrile illness? no    Have you ever had a severe reaction to latex? no    Vaccine information given and explained to patient? yes    Are you currently pregnant? no    Lot Number:[id number]   Exp Date:01/23/2011   Site Given  Left Deltoid IM Josph Macho RMA  April 28, 2010 12:21 PM    Physical Exam  General:  Well-developed,well-nourished,in no acute distress; alert,appropriate and cooperative throughout examination Head:  Normocephalic and atraumatic without obvious abnormalities. No apparent alopecia or balding. Ears:  External ear exam shows no significant lesions or deformities.  Otoscopic examination reveals clear canals,  tympanic membranes are intact bilaterally without bulging, retraction, inflammation or discharge. Hearing is grossly normal bilaterally. Nose:  External nasal examination shows no deformity or inflammation. Nasal mucosa are pink and moist without lesions or exudates. Mouth:  Oral mucosa and oropharynx without lesions or exudates.  Teeth in good repair. Mild erythema in posterior oropharynx Neck:  No deformities, masses, or tenderness noted. Lungs:  b/l rhonchi, decreased BS at right base Heart:  Normal rate and regular rhythm. S1 and S2 normal without gallop,  murmur, click, rub or other extra sounds. Abdomen:  Bowel sounds positive,abdomen soft and non-tender without masses, organomegaly or hernias noted. Extremities:  No clubbing, cyanosis, edema, or deformity noted  Cervical Nodes:  No lymphadenopathy noted Psych:  Cognition and judgment appear intact. Alert and cooperative with normal attention span and concentration. No apparent delusions, illusions, hallucinations   Impression & Recommendations:  Problem # 1:  ACUTE BRONCHITIS (ICD-466.0)  His updated medication list for this problem includes:    Cefdinir 300 Mg Caps (Cefdinir) .Marland Kitchen... 1 cap by mouth two times a day x 10 days    Mucinex 600 Mg Xr12h-tab (Guaifenesin) .Marland Kitchen... 1 tab by mouth two times a day x 10 days    Tessalon Perles 100 Mg Caps (Benzonatate) .Marland Kitchen... 1 cap by mouth three times a day as needed cough    Proair Hfa 108 (90 Base) Mcg/act Aers (Albuterol sulfate) .Marland Kitchen... 2 puffs by mouth q 4 hours as needed cough/wheeze/sob  Orders: T-2 View CXR (71020TC)  Problem # 2:  GERD (ICD-530.81) No c/o at today's no changes  Complete Medication List: 1)  Diabeta 2.5 Mg Tabs (Glyburide) .... One by mouth daily 2)  Adult Aspirin Low Strength 81 Mg Tbdp (Aspirin) .... One by mouth daily 3)  Plavix 75 Mg Tabs (Clopidogrel bisulfate) .... One by mouth daily 4)  Simvastatin 80 Mg Tabs (Simvastatin) .... 1/2 once daily 5)  Icaps Caps (Multiple vitamins-minerals) .Marland Kitchen.. 1 two times a day 6)  Coreg Cr 10 Mg Xr24h-cap (Carvedilol phosphate) .... One by mouth daily 7)  Cefdinir 300 Mg Caps (Cefdinir) .Marland Kitchen.. 1 cap by mouth two times a day x 10 days 8)  Cefdinir 300 Mg Caps (Cefdinir) .Marland Kitchen.. 1 cap by mouth two times a day x 10 days 9)  Mucinex 600 Mg Xr12h-tab (Guaifenesin) .Marland Kitchen.. 1 tab by mouth two times a day x 10 days 10)  Tessalon Perles 100 Mg Caps (Benzonatate) .Marland Kitchen.. 1 cap by mouth three times a day as needed cough 11)  Proair Hfa 108 (90 Base) Mcg/act Aers (Albuterol sulfate) .... 2 puffs by mouth q 4  hours as needed cough/wheeze/sob  Other Orders: Flu Vaccine 57yrs + MEDICARE PATIENTS (Z6109) Administration Flu vaccine - MCR (U0454)  Patient Instructions: 1)  Take your antibiotic as prescribed until ALL of it is gone, but stop if you develop a rash or swelling and contact our office as soon as possible.  2)  Acute Bronchitis symptoms for less then 10 days are not  helped by antibiotics. Take over the counter cough medications. Call if no improvement in 5-7 days, sooner if increasing cough, fever, or new symptoms ( shortness of breath, chest pain) .  3)  Please schedule a follow-up appointment as needed if symptoms worsen or do not resolve. 4)  Take a yogurt such as Activia or a probiotic such as Align caps daily while on antibiotics Prescriptions: PROAIR HFA 108 (90 BASE) MCG/ACT AERS (ALBUTEROL SULFATE) 2 puffs by mouth q 4 hours as needed  cough/wheeze/SOB  #1 hfa x 1   Entered and Authorized by:   Danise Edge MD   Signed by:   Danise Edge MD on 04/28/2010   Method used:   Electronically to        [facility] # [phone]* (retail)       [address]       [place], [place]  [address]       Ph: [phone]       Fax: [phone]   RxID:   [id number] TESSALON PERLES 100 MG CAPS (BENZONATATE) 1 cap by mouth three times a day as needed cough  #30 x 0   Entered and Authorized by:   Danise Edge MD   Signed by:   Danise Edge MD on 04/28/2010   Method used:   Electronically to        [facility] # [phone]* (retail)       [address]       [place], [place]  [address]       Ph: [phone]       Fax: [phone]   RxID:   [id number] CEFDINIR 300 MG CAPS (CEFDINIR) 1 cap by mouth two times a day x 10 days  #20 x 0   Entered and Authorized by:   Danise Edge MD   Signed by:   Danise Edge MD on 04/28/2010   Method used:   Electronically to        [facility] # [id number]* (retail)       [address]        [place], [place]  [address]       Ph: [phone]       Fax: [phone]   RxID:   [id number]

## 2010-08-27 NOTE — Letter (Signed)
Summary: Anticoagulation Modification Letter  [facility]  [address] [place], [place] [address]   Phone: [phone]  Fax: [phone]    June 12, 2010  Re:    Don Montoya DOB:    1914/12/13 MRN:    [id number]    Dear Dr Lovell Sheehan,  We have scheduled the above patient for an endoscopic procedure. Our records show that  he/she is on anticoagulation therapy. Please advise as to how long the patient may come off their therapy of coumadin prior to the scheduled procedure.   Please fax back/or route the completed form to Patty at [phone].  Thank you for your help with this matter.  Sincerely,  Chales Abrahams CMA Duncan Dull)   Physician Recommendation:  Hold Plavix 7 days prior ________________  Hold Coumadin 5 days prior ____________  Other ______________________________     Appended Document: Anticoagulation Modification Letter Pt letter should read Plavix not Coumadin  Appended Document: Anticoagulation Modification Letter pt aware letter scanned to EMR  Appended Document: Anticoagulation Modification Letter may hold plavix for 7 days prior

## 2010-08-27 NOTE — Assessment & Plan Note (Signed)
Summary: fup/njr   Vital Signs:  Patient profile:   77 year old male Weight:      149 pounds Temp:     97.3 degrees F oral Pulse rate:   100 / minute Pulse rhythm:   regular Resp:     16 per minute BP sitting:   140 / 88  Vitals Entered By: Lynann Beaver CMA AAMA (June 08, 2010 10:48 AM) CC: rov Is Patient Diabetic? Yes Did you bring your meter with you today? No Pain Assessment Patient in pain? no        Primary Care Lai Hendriks:  Stacie Glaze MD  CC:  rov.  History of Present Illness: mild asbestosis seen on the xray will need PFTS next OV has noted some mild dysphagia with solids Note the consult in april for consideration for dilation The process was delayed by an [place]  for AAA that showed mild dlation (2.5cm) and he never did go back for the salivary dilation He is on plavix for basilar artery stenosis/ blockage BP stable DM, not checking due to meter    Problems Prior to Update: 1)  Occlusion&stenosis Basilar Artery W/infarct  (ICD-433.01) 2)  Acute Bronchitis  (ICD-466.0) 3)  Low Back Pain, Acute  (ICD-724.2) 4)  Personal Hx Colonic Polyps  (ICD-V12.72) 5)  Cva-stroke  (ICD-434.91) 6)  Gerd  (ICD-530.81) 7)  Cva-stroke  (ICD-434.91) 8)  Personal Hx Colonic Polyps  (ICD-V12.72) 9)  Diverticulosis-colon  (ICD-562.10) 10)  Gerd  (ICD-530.81) 11)  Dysphagia  (ICD-787.29) 12)  Gerd  (ICD-530.81) 13)  Elevated Prostate Specific Antigen  (ICD-790.93) 14)  Macular Degeneration of Retina Unspecified  (ICD-362.50) 15)  Hyperlipidemia  (ICD-272.4) 16)  Family History Diabetes 1st Degree Relative  (ICD-V18.0) 17)  Family History of Cad Male 1st Degree Relative <50  (ICD-V17.3) 18)  Cerebrovascular Accident, Hx of  (ICD-V12.50) 19)  Renal Insufficiency  (ICD-588.9) 20)  Peripheral Vascular Disease  (ICD-443.9) 21)  Hypertension  (ICD-401.9) 22)  Diverticulosis, Colon  (ICD-562.10) 23)  Diabetes Mellitus, Type II  (ICD-250.00)  Medications Prior to  Update: 1)  Diabeta 2.5 Mg  Tabs (Glyburide) .... One By Mouth Daily 2)  Adult Aspirin Low Strength 81 Mg  Tbdp (Aspirin) .... One By Mouth Daily 3)  Plavix 75 Mg  Tabs (Clopidogrel Bisulfate) .... One By Mouth Daily 4)  Simvastatin 80 Mg  Tabs (Simvastatin) .... 1/2 Once Daily 5)  Icaps  Caps (Multiple Vitamins-Minerals) .Marland Kitchen.. 1 Two Times A Day 6)  Coreg Cr 10 Mg Xr24h-Cap (Carvedilol Phosphate) .... One By Mouth Daily 7)  Cefdinir 300 Mg Caps (Cefdinir) .Marland Kitchen.. 1 Cap By Mouth Two Times A Day X 10 Days 8)  Cefdinir 300 Mg Caps (Cefdinir) .Marland Kitchen.. 1 Cap By Mouth Two Times A Day X 10 Days 9)  Mucinex 600 Mg Xr12h-Tab (Guaifenesin) .Marland Kitchen.. 1 Tab By Mouth Two Times A Day X 10 Days 10)  Tessalon Perles 100 Mg Caps (Benzonatate) .Marland Kitchen.. 1 Cap By Mouth Three Times A Day As Needed Cough 11)  Proair Hfa 108 (90 Base) Mcg/act Aers (Albuterol Sulfate) .... 2 Puffs By Mouth Q 4 Hours As Needed Cough/wheeze/sob  Current Medications (verified): 1)  Diabeta 2.5 Mg  Tabs (Glyburide) .... One By Mouth Daily 2)  Adult Aspirin Low Strength 81 Mg  Tbdp (Aspirin) .... One By Mouth Daily 3)  Plavix 75 Mg  Tabs (Clopidogrel Bisulfate) .... One By Mouth Daily 4)  Simvastatin 80 Mg  Tabs (Simvastatin) .... 1/2 Once Daily 5)  Icaps  Caps (Multiple Vitamins-Minerals) .Marland Kitchen.. 1 Two Times A Day 6)  Coreg Cr 10 Mg Xr24h-Cap (Carvedilol Phosphate) .... One By Mouth Daily  Allergies (verified): No Known Drug Allergies  Past History:  Family History: Last updated: 02/07/2007 Family History of CAD Male 1st degree relative <50 Family History Diabetes 1st degree relative  Social History: Last updated: 11/21/2008 Retired Married Never Smoked Daily Caffeine Use-2 Illicit Drug Use - no  Risk Factors: Smoking Status: never (07/15/2009)  Past medical, surgical, family and social histories (including risk factors) reviewed, and no changes noted (except as noted below).  Past Medical History: Reviewed history from 11/21/2008 and  no changes required. Diabetes mellitus, type II Diverticulosis, colon Hypertension Peripheral vascular disease Renal insufficiency Cerebrovascular accident, hx ofS/P VERTEBRAL ARTERY STENT 2007 Hyperlipidemia GERD Constipation  Past Surgical History: Reviewed history from 02/07/2007 and no changes required. GU surgery kidney stones vertibral artery stent  Family History: Reviewed history from 02/07/2007 and no changes required. Family History of CAD Male 1st degree relative <50 Family History Diabetes 1st degree relative  Social History: Reviewed history from 11/21/2008 and no changes required. Retired Married Never Smoked Daily Caffeine Use-2 Illicit Drug Use - no  Review of Systems       The patient complains of weight loss, decreased hearing, and hoarseness.  The patient denies anorexia, fever, weight gain, vision loss, chest pain, syncope, dyspnea on exertion, peripheral edema, prolonged cough, headaches, hemoptysis, abdominal pain, melena, hematochezia, severe indigestion/heartburn, hematuria, incontinence, genital sores, muscle weakness, suspicious skin lesions, transient blindness, difficulty walking, depression, unusual weight change, abnormal bleeding, enlarged lymph nodes, angioedema, breast masses, and testicular masses.    Physical Exam  General:  alert and underweight appearing.   Head:  normocephalic and male-pattern balding.   Eyes:  pupils equal and pupils round.   Ears:  R ear normal and L ear normal.  marked hearing loss Nose:  no external deformity and no nasal discharge.   Neck:  No deformities, masses, or tenderness noted. Lungs:  normal respiratory effort and no wheezes.   Heart:  Normal rate and regular rhythm. S1 and S2 normal without gallop, murmur, click, rub or other extra sounds. Abdomen:  soft, non-tender, normal bowel sounds, and no masses.   Extremities:  No clubbing, cyanosis, edema, or deformity noted  Neurologic:  alert & oriented X3 and  finger-to-nose normal.     Impression & Recommendations:  Problem # 1:  CVA-STROKE (ICD-434.91)  on plavix  due to baslilar artery stenosis His updated medication list for this problem includes:    Adult Aspirin Low Strength 81 Mg Tbdp (Aspirin) ..... One by mouth daily    Plavix 75 Mg Tabs (Clopidogrel bisulfate) ..... One by mouth daily  Orders: TLB-Cholesterol, HDL (83718-HDL) TLB-Cholesterol, Direct LDL (83721-DIRLDL) TLB-Cholesterol, Total (82465-CHO) Specimen Handling (62952)  Problem # 2:  OCCLUSION&STENOSIS BASILAR ARTERY W/INFARCT (ICD-433.01) on plavix... but can hold for procedure His updated medication list for this problem includes:    Adult Aspirin Low Strength 81 Mg Tbdp (Aspirin) ..... One by mouth daily    Plavix 75 Mg Tabs (Clopidogrel bisulfate) ..... One by mouth daily  Problem # 3:  DYSPHAGIA (ICD-787.29) needs to cosider the dilaton  will refer back to GI if a second dilation does  not work would order swallowing study to r/o dysfunction  Problem # 4:  PERIPHERAL VASCULAR DISEASE (ICD-443.9) has 2.5 cm AAA stable repeart needed in May 2012  Problem # 5:  HYPERTENSION (ICD-401.9)  His updated medication list for  this problem includes:    Coreg Cr 10 Mg Xr24h-cap (Carvedilol phosphate) ..... One by mouth daily repeat 140/88 BP today: 172/100 Prior BP: 138/74 (04/28/2010)  Prior 10 Yr Risk Heart Disease: 47 % (11/24/2009)  Labs Reviewed: K+: 4.2 (11/24/2009) Creat: : 1.3 (11/24/2009)   Chol: 146 (11/24/2009)   HDL: 40.00 (11/24/2009)   LDL: 125 (12/06/2008)   TG: 75.0 (12/06/2008)  Problem # 6:  DIABETES MELLITUS, TYPE II (ICD-250.00)  new meter and check A1c His updated medication list for this problem includes:    Diabeta 2.5 Mg Tabs (Glyburide) ..... One by mouth daily    Adult Aspirin Low Strength 81 Mg Tbdp (Aspirin) ..... One by mouth daily  Labs Reviewed: Creat: 1.3 (11/24/2009)     Last Eye Exam: normal (09/23/2009) Reviewed HgBA1c  results: 6.3 (11/24/2009)  6.7 (06/07/2008)  Orders: Venipuncture (40981) TLB-BMP (Basic Metabolic Panel-BMET) (80048-METABOL) TLB-A1C / Hgb A1C (Glycohemoglobin) (83036-A1C) Specimen Handling (19147)  Complete Medication List: 1)  Diabeta 2.5 Mg Tabs (Glyburide) .... One by mouth daily 2)  Adult Aspirin Low Strength 81 Mg Tbdp (Aspirin) .... One by mouth daily 3)  Plavix 75 Mg Tabs (Clopidogrel bisulfate) .... One by mouth daily 4)  Simvastatin 80 Mg Tabs (Simvastatin) .... 1/2 once daily 5)  Icaps Caps (Multiple vitamins-minerals) .Marland Kitchen.. 1 two times a day 6)  Coreg Cr 10 Mg Xr24h-cap (Carvedilol phosphate) .... One by mouth daily  Patient Instructions: 1)  Please schedule a follow-up appointment in 3 months.   Orders Added: 1)  Venipuncture [36415] 2)  TLB-BMP (Basic Metabolic Panel-BMET) [80048-METABOL] 3)  TLB-A1C / Hgb A1C (Glycohemoglobin) [83036-A1C] 4)  TLB-Cholesterol, HDL [83718-HDL] 5)  TLB-Cholesterol, Direct LDL [83721-DIRLDL] 6)  TLB-Cholesterol, Total [82465-CHO] 7)  Specimen Handling [99000] 8)  Est. Patient Level IV [82956]     ANTICOAGULATION RECORD  NEW REGIMEN & LAB RESULTS Regimen:   (no change)

## 2010-08-27 NOTE — Progress Notes (Signed)
Summary: ankle swelling, weakness, chills  Phone Note Call from Patient Call back at Home Phone [phone]   Caller: Spouse Reason for Call: Acute Illness, Talk to Nurse Summary of Call: Wife is calling because right ankle swelling with pain.  He is also having abd discomfort, with nausea, weakness, and chills.  Onset 07/16/10.  The abdominal symptoms have improved but still has chills and ankle swelling, weakness.   Initial call taken by: Kern Reap CMA Duncan Dull),  July 28, 2010 10:25 AM  Follow-up for Phone Call        offered ov with dr j for thursday but insistshe be seen today Follow-up by: Willy Eddy, LPN,  July 28, 2010 11:46 AM

## 2010-08-27 NOTE — Progress Notes (Signed)
Summary: EGD [facility]   ---- Converted from flag ---- ---- 06/09/2010 1:42 PM, Chales Abrahams CMA (AAMA) wrote:   ---- 06/08/2010 6:49 PM, Rachael Fee MD wrote: he told his pcp he was having persistent mild, dysphagia.   Dr. Lovell Sheehan said it was OK to hold his plavix 5 days, can we set him up for EGD, balloon dilation at [facility] during my next EUS day (not this week).  Still have anticoag letter sent. thanks ------------------------------

## 2010-08-27 NOTE — Letter (Signed)
Summary: EGD Instructions  [facility]  [address] [place], [place] [address]   Phone: [phone]  Fax: [phone]       Don Montoya    30-Aug-1914    MRN: [id number]       Procedure Day /Date: 07/02/10 THURS     Arrival Time: 7 am     Procedure Time:8 am     Location of Procedure:                     [name] ( Out[facility])    PREPARATION FOR ENDOSCOPY   On 07/02/10  THE DAY OF THE PROCEDURE:  1.   No solid foods, milk or milk products are allowed after midnight the night before your procedure.  2.   Do not drink anything colored red or purple.  Avoid juices with pulp.  No orange juice.  3.  You may drink clear liquids until 4 am , which is 4 hours before your procedure.                                                                                                CLEAR LIQUIDS INCLUDE: Water Jello Ice Popsicles Tea (sugar ok, no milk/cream) Powdered fruit flavored drinks Coffee (sugar ok, no milk/cream) Gatorade Juice: apple, white grape, white cranberry  Lemonade Clear bullion, consomm, broth Carbonated beverages (any kind) Strained chicken noodle soup Hard Candy   MEDICATION INSTRUCTIONS  Unless otherwise instructed, you should take regular prescription medications with a small sip of water as early as possible the morning of your procedure.  Diabetic patients - see separate instructions.  Stop taking Plavix 06/27/10               OTHER INSTRUCTIONS  You will need a responsible adult at least 75 years of age to accompany you and drive you home.   This person must remain in the waiting room during your procedure.  Wear loose fitting clothing that is easily removed.  Leave jewelry and other valuables at home.  However, you may wish to bring a book to read or an iPod/MP3 player to listen to music as you wait for your procedure to start.  Remove all body piercing jewelry and leave at home.  Total time  from sign-in until discharge is approximately 2-3 hours.  You should go home directly after your procedure and rest.  You can resume normal activities the day after your procedure.  The day of your procedure you should not:   Drive   Make legal decisions   Operate machinery   Drink alcohol   Return to work  You will receive specific instructions about eating, activities and medications before you leave.    The above instructions have been reviewed and explained to me by   Chales Abrahams CMA Duncan Dull)  June 12, 2010 9:35 AM     I fully understand and can verbalize these instructions over the phone mailed to home Date  06/12/10

## 2010-08-27 NOTE — Miscellaneous (Signed)
Summary: Orders Update  Clinical Lists Changes       Diabetes Management Exam:    Eye Exam:       Eye Exam done elsewhere          Date: 03/06/2010          Results: normal          Done by: dr Dione Booze  Diabetes Management Assessment/Plan:      His blood pressure goal is < 130/80.

## 2010-08-27 NOTE — Progress Notes (Signed)
Summary: sore throat/cough  Phone Note Call from Patient Call back at Home Phone [phone] Call back at Work Phone    Caller: Spouse Call For: Stacie Glaze MD Reason for Call: Acute Illness, Refill Medication Complaint: Cough/Sore throat Action Taken: Provider Notified Summary of Call: Wife is calling to let Dr. Lovell Sheehan know that pt is having sore throat, URI, and constant cough since Saturday.  [facility] ([address], [place]).  Initial call taken by: Lynann Beaver CMA,  April 28, 2010 10:05 AM  Follow-up for Phone Call        per dr Florene Glen see dr blythe Follow-up by: Willy Eddy, LPN,  April 28, 2010 10:38 AM

## 2010-08-27 NOTE — Letter (Signed)
Summary: Hold Plavix for GI Procedure  Hold Plavix for GI Procedure   Imported By: Maryln Gottron 06/16/2010 12:16:12  _____________________________________________________________________  External Attachment:    Type:   Image     Comment:   External Document

## 2010-09-25 ENCOUNTER — Encounter: Payer: Self-pay | Admitting: Internal Medicine

## 2010-09-28 ENCOUNTER — Encounter: Payer: Self-pay | Admitting: Internal Medicine

## 2010-09-28 ENCOUNTER — Ambulatory Visit: Payer: Medicare Other | Admitting: Internal Medicine

## 2010-09-28 DIAGNOSIS — E785 Hyperlipidemia, unspecified: Secondary | ICD-10-CM

## 2010-09-28 DIAGNOSIS — I10 Essential (primary) hypertension: Secondary | ICD-10-CM

## 2010-09-28 DIAGNOSIS — Z8679 Personal history of other diseases of the circulatory system: Secondary | ICD-10-CM

## 2010-09-28 DIAGNOSIS — E119 Type 2 diabetes mellitus without complications: Secondary | ICD-10-CM

## 2010-09-28 LAB — LIPID PANEL
Cholesterol: 158 mg/dL (ref 0–200)
LDL Cholesterol: 86 mg/dL (ref 0–99)
Total CHOL/HDL Ratio: 4
VLDL: 35 mg/dL (ref 0.0–40.0)

## 2010-09-28 LAB — BASIC METABOLIC PANEL
BUN: 15 mg/dL (ref 6–23)
CO2: 28 mEq/L (ref 19–32)
Chloride: 105 mEq/L (ref 96–112)
Creatinine, Ser: 1.1 mg/dL (ref 0.4–1.5)
Glucose, Bld: 103 mg/dL — ABNORMAL HIGH (ref 70–99)

## 2010-09-28 MED ORDER — CLOPIDOGREL BISULFATE 75 MG PO TABS: 75.0000 mg | ORAL_TABLET | Freq: Every day | ORAL | Status: AC

## 2010-11-03 LAB — GLUCOSE, CAPILLARY: Glucose-Capillary: 103 mg/dL — ABNORMAL HIGH (ref 70–99)

## 2010-12-08 NOTE — Consult Note (Signed)
Don Montoya, Don Montoya                ACCOUNT NO.:  [id number]   MEDICAL RECORD NO.:  [id number]          PATIENT TYPE:  OUT   LOCATION:  XRAY                         FACILITY:  [facility]   PHYSICIAN:  Delton See, P.A.   DATE OF BIRTH:  28-Dec-1914   DATE OF CONSULTATION:  11/17/2007  DATE OF DISCHARGE:                                 CONSULTATION   CHIEF COMPLAINT:  Cerebrovascular disease.   HISTORY OF PRESENT ILLNESS:  This is a very pleasant, active 77 year old  male with a history of cerebrovascular disease.  He had a left vertebral  artery PTA stent performed by Dr. Corliss Skains on Nov 23, 2005.  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  He had a followup  angiogram performed on February 25, 2006, that showed a 50% left middle  cerebral artery stenosis as well as a residual 50% stenosis just  proximal to the origin of the left PICA.  As noted, the right vertebral  artery is occluded.  The patient was apparently lost to followup until  just recently when the patient's wife called requesting further  evaluation.   PAST MEDICAL HISTORY:  As noted, the patient had a left vertebral artery  PTA stent performed on Nov 23, 2005.  Dr. Corliss Skains has recommended that  the patient stay on aspirin and Plavix indefinitely due to his  cerebrovascular disease.  He had a stroke in 2007.  He has history of  hypertension, diabetes mellitus, previous TIAs, and history of renal  calculi.   PAST SURGICAL HISTORY:  Significant for bilateral hernia repairs, left  arthroscopic knee surgery in 1990, and bilateral cataract surgery.   ALLERGIES:  No known drug allergies.   CURRENT MEDICATIONS:  Include:  1. Omeprazole 20 mg daily.  2. Glyburide 2.5 mg daily.  3. Lisinopril 10 mg one-half tablet daily.  4. Plavix 75 mg daily.  5. Aspirin 81 mg daily.  6. Simvastatin 80 mg one-half tablet at bedtime.   SOCIAL HISTORY:  The patient is married.  He is accompanied by his wife  today.  He  does not use alcohol or tobacco.  They have no children.  This is his second marriage.  The patient lives in [place].  He is  retired.   FAMILY HISTORY:  The patient's mother died at age 68 from natural  causes.  His father died at age 74 from a possible CVA.   IMPRESSION AND PLAN:  As noted, the patient returns today to be seen in  followup.  He and his wife gives the history together.  The patient  reports that he had headaches over the past 2 weeks.  These are mainly  in the back of his head, but also spread to the sides in front of his  head as well.  He also reports staggering around.  He is unable to say  how long he has been staggering, but he also feels that, he has a bad  knee, which may be contributing to the problem.  He reports that  whenever he tilts his head to look up, he becomes very  dizzy.  Overall,  he states that he just does not feel right.  He is concerned that the  previously stented vertebral artery may be closing up.   Dr. Corliss Skains reviewed the results of the previous angiogram with the  patient and his wife.  He showed them the images on the computer.  Dr.  Corliss Skains feels that the patient should have a CT angiogram of the head  to further evaluate for cerebrovascular disease based on the patient's  current symptoms.  The patient and his wife are concerned about the cost  of the Plavix.  Apparently, his insurance is not paying for it.  He is  having to pay out-of-pocket.  They tell us that the [facility] was  recommended he stop the Plavix and continue on aspirin.  Dr. Corliss Skains  feels that this would place the patient at high risk for a further  stroke.  He recommends that they continue aspirin and Plavix therapy at  this time at least until the cerebral angiogram can be completed to  evaluate the patient's status.  We have tentatively scheduled the CT  angiogram for Monday, November 20, 2007.  Greater than 30 minutes was spent  on this consult.       Delton See, P.A.     DR/MEDQ  D:  11/17/2007  T:  11/18/2007  Job:  [id number]   cc:   Stacie Glaze, MD  Pramod P. Pearlean Brownie, MD  Normajean Glasgow Piva, D.O.

## 2010-12-11 NOTE — Consult Note (Signed)
Don Montoya, Don Montoya                ACCOUNT NO.:  [id number]   MEDICAL RECORD NO.:  [id number]          PATIENT TYPE:  OUT   LOCATION:  XRAY                         FACILITY:  [facility]   PHYSICIAN:  Sanjeev K. Deveshwar, M.D.DATE OF BIRTH:  Jul 09, 1915   DATE OF CONSULTATION:  12/09/2005  DATE OF DISCHARGE:                                   CONSULTATION   BRIEF HISTORY:  This is a very pleasant 77 year old male with a history of  TIAs and bilateral cerebellar CVAs on November 01, 2005.  The patient was  admitted to [facility] in April. 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  On Nov 23, 2005, the patient underwent PTA stenting of the  left vertebral junction performed by Dr. Corliss Skains.  The patient tolerated  this well.  He returns today accompanied by his wife approximately 2 weeks  later to be seen in followup.   PAST MEDICAL HISTORY:  Past medical history significant for diabetes  mellitus, hypertension, renal calculi and the above-noted bicerebellar CVA  on November 01, 2005.   PAST SURGICAL HISTORY:  Surgical history significant for bilateral hernia  repairs, left arthroscopic knee surgery in 1990 and bilateral cataract  surgery.   ALLERGIES:  NO KNOWN DRUG ALLERGIES.   CURRENT MEDICATIONS:  Aspirin daily, Plavix 75 mg daily, lisinopril, and  glyburide.   SOCIAL HISTORY:  The patient is married.  He does not use alcohol or  tobacco.  He has no children.  This is his second marriage.  The patient  lives in [place].  He is retired.   FAMILY HISTORY:  His mother died at age 23 from natural causes.  His father  died at age 103 from possible CVA.   IMPRESSION AND PLAN:  The patient appears be doing well at this time.  He  does report that he has had a small knot in his groin area following the  intervention; otherwise, he has had no problems with his wound site.  He is  ambulating with a walker  but occasionally he does have problems with his  balance although his wife feels that he is much better than prior to the  procedure.   Dr. Corliss Skains recommended that he stay on aspirin and Plavix indefinitely.  He also recommended close monitoring of his blood pressure.  The patient  stated that his blood pressure sometimes runs around 100 systolic.  Dr.  Corliss Skains felt that he probably needed at least 120 mmHg blood pressure to  maintain perfusion.  Range of 120-150 systolic would be ideal.   The patient recently saw Dr. Vonita Moss as he had experienced some hematuria  while in the hospital; however, it was felt that this was possibly due to IV  heparin therapy and trauma during the Foley catheter placement.  Apparently  the hematuria is clearing; he will continue to follow with Dr. Vonita Moss.   The patient was given permission to drive short distances, although it was  recommended that he have someone with him in the  car while driving.   As noted, it has been recommended that this patient stay on Plavix  indefinitely.  He gets his medications through the [facility].  Dr.  Corliss Skains has recommended trade name Plavix rather than the generic.  We  will be happy to do whatever we can to help see that the patient is able to  get his medication from the [place].   We will plan on a followup angiogram in approximately 3 months; otherwise,  the patient has been told to call us in the meantime with any problems.   Greater than 30 minutes was spent on this consult.      Delton See, P.A.    ______________________________  Grandville Silos. Corliss Skains, M.D.    DR/MEDQ  D:  12/10/2005  T:  12/10/2005  Job:  [id number]   cc:   Stacie Glaze, M.D. [facility]  [address] [place]  [place] [address]   Pramod P. Pearlean Brownie, MD  Fax: [phone]

## 2010-12-11 NOTE — Consult Note (Signed)
Don Montoya                ACCOUNT NO.:  [id number]   MEDICAL RECORD NO.:  [id number]          PATIENT TYPE:  INP   LOCATION:  1828                         FACILITY:  [facility]   PHYSICIAN:  Deanna Artis. Hickling, M.D.DATE OF BIRTH:  11-May-1915   DATE OF CONSULTATION:  11/01/2005  DATE OF DISCHARGE:                                   CONSULTATION   CHIEF COMPLAINT:  Transient language disorder and vision problem.   HISTORY OF PRESENT ILLNESS:  Don Montoya is an 77 year old married right-  handed gentleman who was seen at urgent care after a transient episode x 2  of inability to get the words out.  The patient was frustrated and aware  that he was not able to communicate.  This began around 1:00 on Sunday,  October 31, 2005.  The patient also had a transient episode of being unable to  see, although we do not know if this was right visual field, left, or both.   The patient did not lose consciousness.  He had a strange look on his face  that he was staring into space.  She called it crazy eyes for 1-2 minutes.  He says that he was kidding, but it is clear that he does not remember some  of the events of yesterday.   The patient did not experience weakness in his arm or legs.  He did not  fall, although he has had problems with unsteady gait.  He did not have  worsening of his speech and language.   PAST MEDICAL HISTORY:  1.  Type 2 non-insulin dependent diabetes mellitus.  2.  Hypertension.   PAST SURGICAL HISTORY:  The patient had three kidney stones in the 1970s  with open procedures for at least one of them.  The patient had bilateral  herniorrhaphies in the 1980s, left arthroscopic surgery of the knee in 1990,  and bilateral iridectomies.   SOCIAL HISTORY:  The patient denies the use of alcohol or tobacco.  He is a  retired Personnel officer.  He worked for a [employer].  He lives with his  wife of 35 years.  It is their second marriage.  He has no children   FAMILY  HISTORY:  His mother died at age 79 of old age.  His father died at  age 71 which may have been related to a stroke.  The patient was estranged  from his father and knows very little bit about him.   MEDICATIONS:  1.  Avalox 400 mg daily.  2.  Glyburide 2.5 mg once daily.  3.  Lisinopril 10 mg, 1/2 tablet daily.  4.  Enteric-coated aspirin has been started, 325 mg daily.  5.  Sliding scale insulin protocol also started.  6.  Lovenox 40 mg subcutaneously every 24 hours for DVT prophylaxis given      his lack of mobility.   DRUG ALLERGIES:  NONE.   REVIEW OF SYSTEMS:  The patient has had unsteadiness on his feet for several  months.  He does not have significant vertigo.  The patient has complained  of recent  chest tightness associated with cough which is somewhat improved.  The patient has had frontal headaches for 2-3 days, left-sided headaches  today.  He has not had a previous history of headaches.  His 12-system  review is otherwise negative.   PHYSICAL EXAMINATION:  VITAL SIGNS:  On examination today, temperature is  97.1, blood pressure 136/90, resting pulse 123, respirations 20, oxygen  saturation 99%.  HEENT:  No infections, no bruits, bilateral hearing aids.  LUNGS:  Clear.  HEART:  No murmurs, pulses normal.  ABDOMEN:  Soft, nontender, bowel sounds normal, no hepatosplenomegaly.  EXTREMITIES:  Normal.  NEUROLOGIC:  Awake, alert, without dysphagia or dysarthria.  Cranial nerves  are round and reactive pupils, status post iridectomies.  Fundi show slight  disk pallor but sharp disk margins.  Symmetrical facial strength, midline  tongue and uvula.  Air conduction is greater than bone conduction.  He has a  definite decrease in auditory acuity.  Motor examination:  Normal strength,  tone, and mass.  Good fine motor movements.  No pronator drift.  Sensation  intact to cold, vibration, pinprick, proprioception, stereognosis.  Cerebellar examination:  Good finger-to-nose, rapid  alternating movements.  Gait was broad-based, he cannot tandem.  His reflexes are diminished.  The  patient had bilateral flexor plantar responses.   IMPRESSION:  1.  Transient ischemic attack, left brain, 435.8.  2.  Risk factors for stroke include age, non-insulin dependent diabetes      mellitus, and hypertension.  3.  Organic gait disorder, 781.2.  4.  CT scan of the brain shows diffuse atrophy, subcortical white matter      disease, calcifications of the basal ganglia.  The cerebellum and brain      stem are not particular affected.   PLAN:  1.  MRI of the brain, MRA intracranial.  2.  2-D echocardiogram and carotid Doppler.  3.  Hemoglobin A1C, serum lipid panel, serum homocystine.  4.  Enteric-coated aspirin 325 mg daily.  5.  PT consult for gait training.   I appreciate the opportunity to participate in his care.  He will be seen by  the stroke service from [facility].      Deanna Artis. Sharene Skeans, M.D.  Electronically Signed     WHH/MEDQ  D:  11/01/2005  T:  11/02/2005  Job:  [id number]   cc:   Rene Paci, M.D. [facility]  [address] [place], [place] [address]

## 2010-12-11 NOTE — Assessment & Plan Note (Signed)
[facility]                         GASTROENTEROLOGY OFFICE NOTE   Don Montoya, Don Montoya                       MRN:          [id number]  DATE:07/22/2006                            DOB:          [birth year]    PRIMARY CARE PHYSICIAN:  Valetta Mole. Swords, M.D.   GI PROBLEM LIST:  1. Dysphagia.  Solid food seems to get stuck in mid chest.  Had barium      swallow September 2007 which showed no obstruction but did suggest      esophageal dysmotility.  This was followed up with a modified      barium swallow study and this showed no obvious oropharyngeal      dysfunction.  EGD, 05/2006, showed mild reflux esophagitis and      edema, causing mild narrowing at the GE junction.  No dilation      done, but patient begun on b.i.d. PPI.  2. Constipation.  This is recent change for him.  He is going once      every 2-3 days and only with the help of Milk of Magnesia.      Colonoscopy, 05/2006, showed two diminutive colon polyps, not      removed due to age and comorbidities and current use of Plavix,      unlikely to be of any clinical significance.  No cancers or      strictures.   CURRENT MEDICATIONS:  Glyburide, lisinopril, Plavix, baby aspirin,  omeprazole twice daily.   PHYSICAL EXAMINATION:  Weight 160 pounds.  Blood pressure 102/64, pulse  80.  CONSTITUTIONAL:  Generally well-appearing.  NEUROLOGIC:  Alert and oriented times three.  ABDOMEN:  Soft, nontender, nondistended.  Normal bowel sounds.   INTERVAL HISTORY:  I last saw Don Montoya at the time of his EGD and  colonoscopy.  Since then, he has been taking omeprazole twice daily and  he has noticed a great improvement in his nearly daily dysphagia.  I  suspect that his dysphagia was related to the GE junction narrowing  caused by some reflux-related edema and inflammation.  He has been on  twice daily PPI since then.  I have instructed him to decrease to once a  day and see how he does.  If dysphagia  returns at all, to go back up to  twice daily.  He may just need that to control his acid best.  I see no  reason for any further procedures for him at this point.  He knows to  get in touch if he has any troubles.     Rachael Fee, MD  Electronically Signed    DPJ/MedQ  DD: 07/22/2006  DT: 07/22/2006  Job #: [id number]   cc:   Valetta Mole. Swords, MD

## 2010-12-11 NOTE — Discharge Summary (Signed)
NAMEKARTHIKEYA, FUNKE                ACCOUNT NO.:  [id number]   MEDICAL RECORD NO.:  [id number]          PATIENT TYPE:  INP   LOCATION:  4728                         FACILITY:  [facility]   PHYSICIAN:  Rene Paci, M.D. LHCDATE OF BIRTH:  09/18/1914   DATE OF ADMISSION:  11/01/2005  DATE OF DISCHARGE:  11/03/2005                                 DISCHARGE SUMMARY   DISCHARGE DIAGNOSES:  1.  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  2.  Recent upper respiratory infection.   HISTORY OF PRESENT ILLNESS:  The patient is an 77 year old white male who  presented to the emergency department on November 01, 2005 with his wife.  The  patient was sent over from Urgent Care.  The wife reported an episode of  transient vision loss x2 and this is also accompanied by transient  expressive aphasia.  The patient and wife deny any loss of consciousness,  and the patient stated that the patient had crazy eyes for 1-2 minutes.  They have noted weakness for the last 2 months accompanied by dizziness in  the past 2 weeks.  Also of note, the patient had recently been started on  Avalox for bronchitis on October 29, 2005.   PAST MEDICAL HISTORY:  1.  Diabetes type 2.  2.  Hypertension.  3.  Removal of kidney stones in 1970s with a total of 3 procedures.  4.  Bilateral hernia repairs, inguinal in the 1980s.  5.  Knee arthroscopy in 1990.   COURSE OF HOSPITALIZATION:  Problem #1.  Acute cerebellar CVA in the setting  of tight high-grade stenosis of left vertebral basilar junction.  The  patient was admitted and an MRI/MRA of the brain was performed, which showed  bilateral cerebellar and left occipital punctate infarcts.  MRA noted fetal-  type right posterior cerebral artery and noted that the infarcts were all in  the same vascular distribution involving the cerebellum in the left PCA  territory suggestive of a left  vertebral source.  An MRA was performed as  well, which noted high-grade tight focal stenosis of the left vertebral  basilar junction just distal to the left PICA.  The patient was evaluated by  Dr. Pearlean Brownie of Neurology and it was recommended that the patient be started on  full dose aspirin and Plavix.  He was noted to have a mildly elevated A1c  with a value of 6.8 and a normal homocysteine with a value of 14.9.  It was  also discussed with the patient by Neurology, the possibility of stenting  the left vertebral stenosis.  The patient and wife wished to proceed.  We  have called Dr. Corliss Skains and this will be arranged for approximately 2  weeks out.  The patient is to be evaluated by Dr. Corliss Skains prior to DC  today.   Problem #2.  Also of note, the patient was evaluated by PT during this hosp  and it was recommended that the patient is safe to be discharged home with  no acute PT needs.   MEDICATIONS AT DISCHARGE:  1.  Plavix 75 mg p.o. daily.  2.  Aspirin 325 mg p.o. daily.  3.  Lisinopril 10 mg tablets, 1/2 tab p.o. daily.  4.  Glyburide 2.5 mg p.o. daily.  5.  Avelox 400 mg p.o. daily through November 08, 2005 and then stop.   PERTINENT LABORATORY AT DISCHARGE:  Hemoglobin 12.9, hematocrit 38.7, A1c  6.8, BUN 18 and creatinine 1.3.   FOLLOW UP:  The patient is scheduled to follow up with Dr. Lovell Sheehan on April  17 at 4:00 p.m.  He is also instructed to follow up with Dr. Pearlean Brownie of  Neurology in approximately 2 months and call for an appointment.  Follow up  will be arranged by Dr. Corliss Skains for placement of stent.      Melissa S. Peggyann Juba, NP      Rene Paci, M.D. [facility]  Electronically Signed    MSO/MEDQ  D:  11/03/2005  T:  11/03/2005  Job:  [id number]   cc:   Stacie Glaze, M.D. [facility]  [address] [place]  [place] [address]   Pramod P. Pearlean Brownie, MD  Fax: [phone]

## 2010-12-11 NOTE — Assessment & Plan Note (Signed)
[facility]                           GASTROENTEROLOGY OFFICE NOTE   CLARKSON, ROSSELLI                       MRN:          [id number]  DATE:06/07/2006                            DOB:          12/18/1914    PRIMARY CARE PHYSICIAN:  Dr. Birdie Sons   GI PROBLEM LIST:  1. Dysphagia.  Solid food seems to get stuck in mid chest.  Had barium      swallow September 2007 which showed no obstruction but did suggest      esophageal dysmotility.  This was followed up with a modified barium      swallow study and this showed no obvious oropharyngeal dysfunction.  2. Constipation.  This is recent change for him.  He is going once every 2-      3 days and only with the help of Milk of Magnesia.   CURRENT MEDICINES:  Glyburide, lisinopril, Plavix, aspirin.   PHYSICAL EXAMINATION:  VITAL SIGNS:  Weight 160 pounds, blood pressure  120/68, pulse 80.  CONSTITUTIONAL:  Well-appearing.  NEUROLOGIC:  Alert and oriented x3.  LUNGS:  Clear to auscultation bilaterally.  HEART:  Regular rate and rhythm.  ABDOMEN:  Soft, nontender, nondistended, normal bowel sounds.  EXTREMITIES:  No lower extremity edema.   ASSESSMENT AND PLAN:  An 77 year old man with both upper and lower  gastrointestinal symptoms.  The dysphagia may represent underlying  esophageal inflammation.  I am going to put him on a proton pump inhibitor  once daily for now and plan to do an EGD as his soonest convenience.  At the  same time, I will proceed with full colonoscopy because he has been  complaining of a lot of constipation over the past several months.  He is on  Plavix and aspirin and this is for a recent carotid stent that was placed.  I do not think that should be held for now, so given his age I will probably  ignore small or perhaps even medium-size polyps.  If he has anything large  or certainly any cancers, those can be biopsied.  I think it is unlikely  that he will need stretching of  his esophagus, given his normal esophagram  recently, so I do not think that his being on Plavix or aspirin will cause  problems with that test.     Rachael Fee, MD  Electronically Signed    DPJ/MedQ  DD: 06/07/2006  DT: 06/07/2006  Job #: [id number]   cc:   Valetta Mole. Swords, MD

## 2010-12-11 NOTE — Assessment & Plan Note (Signed)
[facility]                           GASTROENTEROLOGY OFFICE NOTE   MOATAZ, TAVIS                       MRN:          [id number]  DATE:03/25/2006                            DOB:          01/24/1915    GASTROENTEROLOGY CONSULTATION   REFERRING PHYSICIAN:  Valetta Mole. Swords, MD.   REASON FOR REFERRAL:  Dr. Cato Mulligan asked me to evaluate Mr. Pafford in  consultation regarding recent dysphagia.   HISTORY OF PRESENT ILLNESS:  Mr. Gascoigne is a very pleasant, 77 year old  man, who in May of 2007 had a mini stroke.  His wife tells me that his only  visible deficit has been some balance difficulties.  He did undergo a  carotid stenting of his left carotid and tells me that his dysphagia began  after that procedure.  He describes this as food having trouble going down,  liquids seem to pass fine, but solids do seem to cause choking or get  caught.  From his description, it is not clear whether this has been  oropharyngeal dysphagia or true esophageal dysphagia.  He has had no overt  food impactions though.   REVIEW OF SYSTEMS:  Essentially negative and is available on his nursing  intake sheet.   PAST MEDICAL HISTORY:  1. Recent stroke.  2. Diabetes.  3. Kidney stones.  4. Angioplasty and stent in May of 2007.   CURRENT MEDICATIONS:  Aspirin, Plavix, Lisinopril, and Glyburide.   ALLERGIES:  No known drug allergies.   SOCIAL HISTORY:  Married, retired, nonsmoker, nondrinker.   FAMILY HISTORY:  Diabetes and heart disease run in his family.   PHYSICAL EXAMINATION:  VITAL SIGNS:  5 foot 7 inches, 157 pounds, blood  pressure 130/72, pulse 88.  CONSTITUTIONAL:  Generally well appearing.  NEUROLOGIC:  Alert and oriented x3.  EYES:  Extraocular movements intact.  MOUTH:  Oropharynx moist, no lesions.  NECK:  Supple, no lymphadenopathy.  CARDIOVASCULAR:  Heart regular rate and rhythm.  LUNGS:  Clear to auscultation bilaterally.  ABDOMEN:  Soft,  nontender, nondistended, normoactive bowel sounds.  EXTREMITIES:  No lower extremity edema.  SKIN:  No rashes or lesions of the visible extremities.   ASSESSMENT AND PLAN:  An 77 year old man with recent stroke, new dysphagia.   His description of the dysphagia is somewhat confusing.  I cannot tell  whether he has been having oropharyngeal dysphagia or more obstructive  esophageal dysphagia.  He is also on Plavix and aspirin and is quite  advanced in age, and since he has had a recent stroke I think it is very  important to rule out oropharyngeal dysphagia.  I will arrange for him to  have an upper GI test as well as a modified barium swallow at the same time.  If no oropharyngeal dysphagia is described and he has narrowing in his  esophagus, we will proceed then at that point to EGD.  We will have to  consider how to hold his anticoagulants, he has a new stent in place in his  heart and his neck and is on Plavix and aspirin.  Rachael Fee, MD   DPJ/MedQ  DD:  03/25/2006  DT:  03/26/2006  Job #:  [id number]   cc:   Valetta Mole. Swords, MD

## 2011-01-28 ENCOUNTER — Ambulatory Visit: Payer: PRIVATE HEALTH INSURANCE | Admitting: Internal Medicine

## 2011-01-29 ENCOUNTER — Ambulatory Visit (INDEPENDENT_AMBULATORY_CARE_PROVIDER_SITE_OTHER): Payer: Medicare Other | Admitting: Internal Medicine

## 2011-01-29 ENCOUNTER — Encounter: Payer: Self-pay | Admitting: Internal Medicine

## 2011-01-29 DIAGNOSIS — I10 Essential (primary) hypertension: Secondary | ICD-10-CM

## 2011-01-29 DIAGNOSIS — N401 Enlarged prostate with lower urinary tract symptoms: Secondary | ICD-10-CM

## 2011-01-29 DIAGNOSIS — R339 Retention of urine, unspecified: Secondary | ICD-10-CM

## 2011-01-29 DIAGNOSIS — E119 Type 2 diabetes mellitus without complications: Secondary | ICD-10-CM

## 2011-01-29 DIAGNOSIS — R972 Elevated prostate specific antigen [PSA]: Secondary | ICD-10-CM

## 2011-01-29 DIAGNOSIS — H3530 Unspecified macular degeneration: Secondary | ICD-10-CM

## 2011-01-29 MED ORDER — SILODOSIN 8 MG PO CAPS: 8.0000 mg | ORAL_CAPSULE | Freq: Every day | ORAL | Status: DC

## 2011-01-29 NOTE — Progress Notes (Signed)
  Subjective:    Patient ID: Don Montoya, male    DOB: [birth year], 77 y.o.   MRN: [id number]  HPI Patient visits for followup of diabetes and hypertension.  He had blood work drawn prior to his office visit showing an A1c stable in the 6 range and his blood pressure was 140/86 today.  He has macular degeneration and has been seen by his ophthalmologist as recently given him treatments which she states has stabilized his vision loss.  He has severe hearing loss from [employer] experience.  He presents with a new complaint today of urinary frequency he was seen by urologist treated for a prostate infection with doxycycline and then given an unknown agent for bladder frequency.  He states that neither of these interventions worked for him.   Review of Systems  Constitutional: Negative for fever and fatigue.  HENT: Positive for hearing loss. Negative for congestion, neck pain and postnasal drip.   Eyes: Negative for discharge, redness and visual disturbance.  Respiratory: Negative for cough, shortness of breath and wheezing.   Cardiovascular: Negative for leg swelling.  Gastrointestinal: Negative for abdominal pain, constipation and abdominal distention.  Genitourinary: Positive for urgency, frequency and decreased urine volume.  Musculoskeletal: Negative for joint swelling and arthralgias.  Skin: Negative for color change and rash.  Neurological: Negative for weakness and light-headedness.  Hematological: Negative for adenopathy.  Psychiatric/Behavioral: Negative for behavioral problems.   Past Medical History  Diagnosis Date  . Diabetes mellitus   . Diverticulosis   . Hypertension   . PVD (peripheral vascular disease)   . Renal insufficiency   . Stroke   . Hyperlipidemia   . GERD (gastroesophageal reflux disease)   . Constipation    Past Surgical History  Procedure Date  . Kidney stone surgery   . Vertebral artery stent     reports that he has never smoked. He does not  have any smokeless tobacco history on file. He reports that he does not drink alcohol or use illicit drugs. family history includes Diabetes in his mother and Heart disease in his mother. No Known Allergies     Objective:   Physical Exam  Nursing note and vitals reviewed. Constitutional: He appears well-developed and well-nourished.  HENT:  Head: Normocephalic and atraumatic.  Eyes: Conjunctivae are normal. Pupils are equal, round, and reactive to light.  Neck: Normal range of motion. Neck supple.  Cardiovascular: Normal rate and regular rhythm.   Pulmonary/Chest: Effort normal and breath sounds normal.  Abdominal: Soft. Bowel sounds are normal.          Assessment & Plan:  Will try rapaflow 8 mg by mouth with breakfast for his urinary frequency to see if this will alleviate some of his problems with frequency and control of end of stream (dribbling).  His blood sugar is stable his blood pressure stable he states his macular degeneration his improved

## 2011-01-29 NOTE — Assessment & Plan Note (Signed)
Seeing Dr Vonita Moss Recently treated with doxycycline

## 2011-03-27 ENCOUNTER — Ambulatory Visit (INDEPENDENT_AMBULATORY_CARE_PROVIDER_SITE_OTHER): Payer: Medicare Other | Admitting: Family Medicine

## 2011-03-27 ENCOUNTER — Encounter: Payer: Self-pay | Admitting: Family Medicine

## 2011-03-27 DIAGNOSIS — M25559 Pain in unspecified hip: Secondary | ICD-10-CM

## 2011-03-27 DIAGNOSIS — M545 Low back pain, unspecified: Secondary | ICD-10-CM

## 2011-03-27 MED ORDER — PREDNISONE 20 MG PO TABS: ORAL_TABLET | ORAL | Status: DC

## 2011-03-27 MED ORDER — HYDROCODONE-ACETAMINOPHEN 5-500 MG PO TABS: ORAL_TABLET | ORAL | Status: DC

## 2011-03-27 NOTE — Progress Notes (Signed)
OFFICE VISIT  03/30/2011   CC:  Chief Complaint  Patient presents with  . Sciatica    pt states he went to urgent care a few weeks ago and was given a shot but has had no relief;      HPI:    Patient is a 77 y.o. Caucasian male who presents for left buttock pain. Onset 7d/a, left glut area.  No preceding trauma or injury, no low back pain, question of extension of the pain into thigh down to knee level but this is not consistent.  No weakness, no tingling or numbness.  Walking and weight bearing make it worse, sitting relieves it some.  Went to [place] urgent care on day after onset and was given injection in the area of pain per his report but this brought no relief.  Was rx'd pain med: ?tramadol--no signif help.    Past Medical History  Diagnosis Date  . Diabetes mellitus   . Diverticulosis   . Hypertension   . PVD (peripheral vascular disease)   . Renal insufficiency   . Stroke   . Hyperlipidemia   . GERD (gastroesophageal reflux disease)   . Constipation     Past Surgical History  Procedure Date  . Kidney stone surgery   . Vertebral artery stent     Outpatient Prescriptions Prior to Visit  Medication Sig Dispense Refill  . aspirin 81 MG tablet Take 81 mg by mouth daily.        . carvedilol (COREG CR) 10 MG 24 hr capsule Take 10 mg by mouth daily.        . clopidogrel (PLAVIX) 75 MG tablet Take 75 mg by mouth daily.        Marland Kitchen glyBURIDE (DIABETA) 2.5 MG tablet Take 2.5 mg by mouth daily with breakfast.        . Multiple Vitamins-Minerals (ICAPS MV PO) Take by mouth 2 (two) times daily.        Marland Kitchen omeprazole (PRILOSEC) 20 MG capsule Take 20 mg by mouth daily.        . silodosin (RAPAFLO) 8 MG CAPS capsule Take 1 capsule (8 mg total) by mouth daily with breakfast.  30 capsule  2  . simvastatin (ZOCOR) 80 MG tablet Take 80 mg by mouth at bedtime.          No Known Allergies  ROS As per HPI  PE: Blood pressure 136/80, temperature 97.3 F (36.3 C), temperature source  Oral, weight 146 lb (66.225 kg). Gen: Alert, well appearing.  Patient is oriented to person, place, time, and situation. Gait: no limp. L spine is nontender.  Low back ROM intact w/out pain or stiffness.  Left sciatic notch TTP, right side w/out tenderness. Mild pain with ER/IR of left hip, as well as flexion and extension of left hip.  ?straight leg raise + on left. LE strength intact/symmetric prox and dist.  Patellar and achilles DTRs 1+, symmetric.  LABS:  none  IMPRESSION AND PLAN:  Hip pain Sciatica likely, although would like to make sure his hip and low back look ok radiographically. Radiographs ordered. Prednisone 40mg  qd x 5d, then 20mg  qd x 5d.  Avoid NSAIDs. Vicodin 5/500 q6h prn pain.  Therapeutic expectations and side effect profile of medication discussed today.  Patient's questions answered. ?Physical therapy.     FOLLOW UP: Return for has appt set w/Dr. Lovell Sheehan on 04/07/11.

## 2011-03-30 DIAGNOSIS — M25559 Pain in unspecified hip: Secondary | ICD-10-CM | POA: Insufficient documentation

## 2011-03-30 NOTE — Assessment & Plan Note (Signed)
Sciatica likely, although would like to make sure his hip and low back look ok radiographically. Radiographs ordered. Prednisone 40mg  qd x 5d, then 20mg  qd x 5d.  Avoid NSAIDs. Vicodin 5/500 q6h prn pain.  Therapeutic expectations and side effect profile of medication discussed today.  Patient's questions answered. ?Physical therapy.

## 2011-04-02 ENCOUNTER — Telehealth: Payer: Self-pay | Admitting: Family Medicine

## 2011-04-02 ENCOUNTER — Other Ambulatory Visit: Payer: Self-pay | Admitting: Family Medicine

## 2011-04-02 ENCOUNTER — Ambulatory Visit (HOSPITAL_BASED_OUTPATIENT_CLINIC_OR_DEPARTMENT_OTHER)
Admission: RE | Admit: 2011-04-02 | Discharge: 2011-04-02 | Disposition: A | Discharge status: Home / Self Care | Payer: Medicare Other | Source: Ambulatory Visit | Attending: Family Medicine | Admitting: Family Medicine

## 2011-04-02 DIAGNOSIS — M545 Low back pain: Secondary | ICD-10-CM

## 2011-04-02 DIAGNOSIS — M1610 Unilateral primary osteoarthritis, unspecified hip: Secondary | ICD-10-CM | POA: Insufficient documentation

## 2011-04-02 DIAGNOSIS — M169 Osteoarthritis of hip, unspecified: Secondary | ICD-10-CM | POA: Insufficient documentation

## 2011-04-02 DIAGNOSIS — M25559 Pain in unspecified hip: Secondary | ICD-10-CM | POA: Insufficient documentation

## 2011-04-02 NOTE — Telephone Encounter (Signed)
This is a patient of Dr. Lovell Sheehan' that I saw in Saturday clinic 03/27/11. I left a message on the patient's voice mail about the plan below: Please contact Dr. Jeannetta Ellis office [facility] [phone]) and arrange appt ASAP with Dr. Darrelyn Hillock.  Mr. Shein has seen him before, back in 2010, so I don't know if a new referral is needed.  If so, let me know and I'll enter order. Reason for appointment is left hip/gluteal pain, suspect L2 spinal nerve impingement.  Please send copies of x-ray reports (lumbar spine and left hip, dated 04/02/11) and my recent office note.  Thx--PM

## 2011-04-03 ENCOUNTER — Encounter (HOSPITAL_BASED_OUTPATIENT_CLINIC_OR_DEPARTMENT_OTHER): Payer: Self-pay | Admitting: Emergency Medicine

## 2011-04-03 ENCOUNTER — Emergency Department (HOSPITAL_BASED_OUTPATIENT_CLINIC_OR_DEPARTMENT_OTHER)
Admission: EM | Admit: 2011-04-03 | Discharge: 2011-04-03 | Disposition: A | Discharge status: Home / Self Care | Payer: Medicare Other | Attending: Emergency Medicine | Admitting: Emergency Medicine

## 2011-04-03 DIAGNOSIS — E119 Type 2 diabetes mellitus without complications: Secondary | ICD-10-CM | POA: Insufficient documentation

## 2011-04-03 DIAGNOSIS — Z8679 Personal history of other diseases of the circulatory system: Secondary | ICD-10-CM | POA: Insufficient documentation

## 2011-04-03 DIAGNOSIS — K219 Gastro-esophageal reflux disease without esophagitis: Secondary | ICD-10-CM | POA: Insufficient documentation

## 2011-04-03 DIAGNOSIS — E785 Hyperlipidemia, unspecified: Secondary | ICD-10-CM | POA: Insufficient documentation

## 2011-04-03 DIAGNOSIS — M79609 Pain in unspecified limb: Secondary | ICD-10-CM | POA: Insufficient documentation

## 2011-04-03 DIAGNOSIS — M79606 Pain in leg, unspecified: Secondary | ICD-10-CM

## 2011-04-03 DIAGNOSIS — M5430 Sciatica, unspecified side: Secondary | ICD-10-CM

## 2011-04-03 DIAGNOSIS — I10 Essential (primary) hypertension: Secondary | ICD-10-CM | POA: Insufficient documentation

## 2011-04-03 MED ORDER — MORPHINE SULFATE 4 MG/ML IJ SOLN
4.0000 mg | Freq: Once | INTRAMUSCULAR | Status: AC
  Administered 2011-04-03: 4 mg via INTRAVENOUS
  Filled 2011-04-03: qty 1

## 2011-04-03 MED ORDER — DIAZEPAM 5 MG/ML IJ SOLN
2.5000 mg | Freq: Once | INTRAMUSCULAR | Status: AC
  Administered 2011-04-03: 2.5 mg via INTRAMUSCULAR
  Filled 2011-04-03: qty 2

## 2011-04-03 MED ORDER — MORPHINE SULFATE 4 MG/ML IJ SOLN
4.0000 mg | Freq: Once | INTRAMUSCULAR | Status: AC
  Administered 2011-04-03: 4 mg via INTRAMUSCULAR
  Filled 2011-04-03: qty 1

## 2011-04-03 MED ORDER — HYDROCODONE-ACETAMINOPHEN 5-500 MG PO TABS: 1.0000 | ORAL_TABLET | Freq: Four times a day (QID) | ORAL | Status: AC | PRN

## 2011-04-03 NOTE — ED Notes (Signed)
Left buttock to leg pain for years, worse over last two days.

## 2011-04-03 NOTE — ED Provider Notes (Signed)
History     89yM with L buttock/poterior L thigh pain. Chronic with waxing/waning course but worse in last several days. Pt attributes to sciatica. Denies trauma. NO fever or chills. NO numbness, weakness or loss of strength. No bladder/bowel incontinence or retention. NO rash. Evealuted as outpt about a week ago and given scripts for prednisone and vicodin. Pt says took steroids but pharmacy apparently did receive script for hydrocodone.   CSN: [id number] Arrival date & time: 04/03/2011  9:50 AM  Chief Complaint  Patient presents with  . Leg Pain   Patient is a 77 y.o. male presenting with leg pain. The history is provided by the patient.  Leg Pain  The incident occurred more than 1 week ago. There was no injury mechanism. The pain is present in the left thigh. Pertinent negatives include no numbness, no loss of motion, no muscle weakness, no loss of sensation and no tingling.    Past Medical History  Diagnosis Date  . Diabetes mellitus   . Diverticulosis   . Hypertension   . PVD (peripheral vascular disease)   . Renal insufficiency   . Stroke   . Hyperlipidemia   . GERD (gastroesophageal reflux disease)   . Constipation     Past Surgical History  Procedure Date  . Kidney stone surgery   . Vertebral artery stent     Family History  Problem Relation Age of Onset  . Diabetes Mother   . Heart disease Mother     History  Substance Use Topics  . Smoking status: Never Smoker   . Smokeless tobacco: Not on file  . Alcohol Use: No      Review of Systems  Constitutional: Negative for fever and chills.  HENT: Negative for neck pain and neck stiffness.   Respiratory: Negative for shortness of breath.   Cardiovascular: Negative for chest pain.  Gastrointestinal: Negative for nausea and abdominal pain.  Genitourinary: Negative for difficulty urinating.  Musculoskeletal: Negative for joint swelling.  Skin: Negative for color change and rash.  Neurological: Negative for  dizziness, tingling, syncope, weakness, light-headedness, numbness and headaches.  Psychiatric/Behavioral: Negative.   All other systems reviewed and are negative.    Physical Exam  BP 163/87  Pulse 104  Temp(Src) 97.5 F (36.4 C) (Oral)  Resp 20  Ht 5\' 6"  (1.676 m)  Wt 146 lb (66.225 kg)  BMI 23.56 kg/m2  SpO2 100%  Physical Exam  Constitutional: He appears well-developed.  HENT:  Head: Atraumatic.  Eyes: Conjunctivae are normal. Pupils are equal, round, and reactive to light.  Neck: Neck supple.  Cardiovascular: Normal rate and regular rhythm.   Pulmonary/Chest: Effort normal and breath sounds normal.  Abdominal: Soft. He exhibits no distension. There is no tenderness.  Musculoskeletal:       Mild tenderness with palpation L buttock. NO significant midline spinal tenderness or over L SI joint. +straight leg test. Neurovascularly intact distally. Strength 5/5 b/l hi, pedal & knee flexion/extension.  Neurological:       Gait slighly wide based but normal appearing per wife. No truncal ataxia noted.  Skin: Skin is warm and dry.  Psychiatric: He has a normal mood and affect.    ED Course  Procedures  MDM 89yM with L buttock/L leg pain. Suspect sciatica. Also consider cauda equina, SEA/SEH, and fx but clinically less likely. XR results from 9/1 reviewed and no acute abnormality. Pt states same pain since then and denies trauma in interim.Nonfocal neuro exam. Will tx symptomatically and ambulate.  Likely outpt f/u.  Raeford Razor, MD 04/05/11 [phone]

## 2011-04-03 NOTE — ED Notes (Signed)
Pt states "doing OK Now" Family at side follow up care reviewed

## 2011-04-03 NOTE — ED Notes (Signed)
Family at bedside.Pt ambulatory in hall without difficulty

## 2011-04-05 NOTE — Telephone Encounter (Signed)
Ok.  Thanks.   I think my message to you was a little confusing.  I meant it to say for you to make the appt for him, not for him to call [facility] himself.  Either way is fine, though, as long as it gets set up!  --PM

## 2011-04-05 NOTE — Telephone Encounter (Signed)
I contacted [facility], patient has not made an appt, I called the patient's home # -the line was busy, will try again later....Just for my own Don Montoya fax is [phone]

## 2011-04-06 NOTE — Telephone Encounter (Signed)
The patient has an appt 04/08/11 1:15PM with Dr Darrelyn Hillock at [facility], I faxed OV & xray reports, SW patient's wife Britta Mccreedy, she stated they could make it to the appt

## 2011-04-06 NOTE — Telephone Encounter (Signed)
Noted-PM 

## 2011-04-07 ENCOUNTER — Ambulatory Visit: Payer: PRIVATE HEALTH INSURANCE | Admitting: Internal Medicine

## 2011-04-09 ENCOUNTER — Other Ambulatory Visit: Payer: Self-pay | Admitting: Orthopedic Surgery

## 2011-04-09 DIAGNOSIS — M549 Dorsalgia, unspecified: Secondary | ICD-10-CM

## 2011-04-14 ENCOUNTER — Ambulatory Visit
Admission: RE | Admit: 2011-04-14 | Discharge: 2011-04-14 | Disposition: A | Discharge status: Home / Self Care | Payer: PRIVATE HEALTH INSURANCE | Source: Ambulatory Visit | Attending: Orthopedic Surgery | Admitting: Orthopedic Surgery

## 2011-04-14 ENCOUNTER — Ambulatory Visit
Admission: RE | Admit: 2011-04-14 | Discharge: 2011-04-14 | Disposition: A | Discharge status: Home / Self Care | Payer: Medicare Other | Source: Ambulatory Visit | Attending: Orthopedic Surgery | Admitting: Orthopedic Surgery

## 2011-04-14 DIAGNOSIS — M549 Dorsalgia, unspecified: Secondary | ICD-10-CM

## 2011-04-14 DIAGNOSIS — M545 Low back pain: Secondary | ICD-10-CM

## 2011-04-14 MED ORDER — DIAZEPAM 2 MG PO TABS
5.0000 mg | ORAL_TABLET | Freq: Once | ORAL | Status: AC
  Administered 2011-04-14: 2.5 mg via ORAL

## 2011-04-14 MED ORDER — IOHEXOL 180 MG/ML  SOLN
20.0000 mL | Freq: Once | INTRAMUSCULAR | Status: AC | PRN
  Administered 2011-04-14: 20 mL via INTRATHECAL

## 2011-04-14 MED ORDER — SODIUM CHLORIDE 0.9 % IV SOLN: 4.0000 mg | Freq: Four times a day (QID) | INTRAVENOUS | Status: DC | PRN

## 2011-04-19 ENCOUNTER — Telehealth: Payer: Self-pay | Admitting: *Deleted

## 2011-04-19 NOTE — Telephone Encounter (Signed)
Dr. Juliene Pina ordered a mylegram  last week, and cannot get results or see Dr. Juliene Pina until October 1.  Pt is in terrible pain, and wife is asking Dr. Lovell Sheehan what they can do???

## 2011-04-19 NOTE — Telephone Encounter (Signed)
See if someone in his office can see him for this since the myelogram was ordered by his orthopedist If that office cannot handle it please to send him to Dr. Noel Gerold and obtain the results of the myelogram

## 2011-04-19 NOTE — Telephone Encounter (Signed)
Wife notified.

## 2011-04-19 NOTE — Telephone Encounter (Signed)
Spoke to [facility], and they assured me that they would have Dr. Juliene Pina call pt today with results.

## 2011-04-20 LAB — CREATININE, SERUM
Creatinine, Ser: 1.61 — ABNORMAL HIGH
GFR calc non Af Amer: 41 — ABNORMAL LOW

## 2011-04-20 LAB — BUN
BUN: 27 — ABNORMAL HIGH
BUN: 28 — ABNORMAL HIGH

## 2011-05-04 ENCOUNTER — Telehealth: Payer: Self-pay | Admitting: *Deleted

## 2011-05-04 NOTE — Telephone Encounter (Addendum)
Wife states she needs to speak to Dr. Lovell Sheehan about an office visit the pt had with another MD today.  Called but the line was busy x 2.  Will try later. Line busy x 2 again at 3:20pm.

## 2011-05-04 NOTE — Telephone Encounter (Signed)
Pt was at an Urgent Care with [facility], and told him to see his Primary Care MD ASAP.  Was given some medication (Miralax) to take tonight.  Wants to ask Dr. Lovell Sheehan what to do next.??

## 2011-05-04 NOTE — Telephone Encounter (Signed)
Please advise on previous phone notes.

## 2011-05-05 ENCOUNTER — Other Ambulatory Visit: Payer: Self-pay | Admitting: *Deleted

## 2011-05-05 ENCOUNTER — Ambulatory Visit (INDEPENDENT_AMBULATORY_CARE_PROVIDER_SITE_OTHER)
Admission: RE | Admit: 2011-05-05 | Discharge: 2011-05-05 | Disposition: A | Discharge status: Home / Self Care | Payer: Medicare Other | Source: Ambulatory Visit | Attending: Internal Medicine | Admitting: Internal Medicine

## 2011-05-05 DIAGNOSIS — K5900 Constipation, unspecified: Secondary | ICD-10-CM

## 2011-05-05 NOTE — Telephone Encounter (Signed)
Was this pt sent to xray, Kendal Hymen?

## 2011-05-05 NOTE — Telephone Encounter (Signed)
Called pt;s wife and informed of his xray- per dr Lovell Sheehan- give 1/2 mag citrate and call us in am if not bm

## 2011-05-06 ENCOUNTER — Emergency Department (HOSPITAL_COMMUNITY): Payer: Medicare Other

## 2011-05-06 ENCOUNTER — Emergency Department (HOSPITAL_COMMUNITY)
Admission: EM | Admit: 2011-05-06 | Discharge: 2011-05-06 | Disposition: A | Discharge status: Home / Self Care | Payer: Medicare Other | Attending: Emergency Medicine | Admitting: Emergency Medicine

## 2011-05-06 ENCOUNTER — Telehealth: Payer: Self-pay | Admitting: *Deleted

## 2011-05-06 DIAGNOSIS — K5900 Constipation, unspecified: Secondary | ICD-10-CM | POA: Insufficient documentation

## 2011-05-06 DIAGNOSIS — Z79899 Other long term (current) drug therapy: Secondary | ICD-10-CM | POA: Insufficient documentation

## 2011-05-06 DIAGNOSIS — R634 Abnormal weight loss: Secondary | ICD-10-CM | POA: Insufficient documentation

## 2011-05-06 DIAGNOSIS — E119 Type 2 diabetes mellitus without complications: Secondary | ICD-10-CM | POA: Insufficient documentation

## 2011-05-06 DIAGNOSIS — I10 Essential (primary) hypertension: Secondary | ICD-10-CM | POA: Insufficient documentation

## 2011-05-06 DIAGNOSIS — Z9889 Other specified postprocedural states: Secondary | ICD-10-CM | POA: Insufficient documentation

## 2011-05-06 DIAGNOSIS — R630 Anorexia: Secondary | ICD-10-CM | POA: Insufficient documentation

## 2011-05-06 NOTE — Telephone Encounter (Signed)
Wife called stating pt still has only had a small amount of liquid after taking his Magnesium Citrate.  Per Dr. Lovell Sheehan, have pt go to the ER to be checked for possible impaction.

## 2011-05-06 NOTE — Telephone Encounter (Signed)
Pt went to the ER and there is no impaction and no abnormal findings.

## 2011-05-07 NOTE — Telephone Encounter (Signed)
Dr Lovell Sheehan

## 2011-05-08 ENCOUNTER — Encounter: Payer: Self-pay | Admitting: Internal Medicine

## 2011-05-08 ENCOUNTER — Ambulatory Visit (INDEPENDENT_AMBULATORY_CARE_PROVIDER_SITE_OTHER): Payer: Medicare Other | Admitting: Internal Medicine

## 2011-05-08 VITALS — BP 142/60 | Temp 97.8°F | Wt 144.0 lb

## 2011-05-08 DIAGNOSIS — F411 Generalized anxiety disorder: Secondary | ICD-10-CM

## 2011-05-08 DIAGNOSIS — K5900 Constipation, unspecified: Secondary | ICD-10-CM

## 2011-05-08 DIAGNOSIS — F419 Anxiety disorder, unspecified: Secondary | ICD-10-CM

## 2011-05-08 NOTE — Patient Instructions (Addendum)
This  Weekend .  Increase the miralax to 2 capful each day . The pain medication could have given you severe constipation in the meantime even though you are no longer taking this. However We need  Dr Lovell Sheehan to see you this week .  Will try to arrange this  But call on mOnday I do not think you are in any danger at this time. Ok to take the stool softener .

## 2011-05-08 NOTE — Progress Notes (Signed)
Subjective:    Patient ID: Don Montoya, male    DOB: 1915/03/03, 77 y.o.   MRN: [id number]  HPI  Comes in to Saturday clinic walk in  After ed visit yesterday  Because of no stool for about 10 days"" constipation .  Normal bowel pattern has been daily  Without problem up until recently.  Has been to?  Multiple places for this problem  Has had 2  X ray s of abdomen . Plain films showing no obstruction.   Dr Netta Corrigan  Has recently been caring for a problem with leg pain  .  Had been takig a pain pill but none for a week or so. Also had had som aleve  NOted uncomfortable and bloating? Abdomen without NVD and went to urgent care once and then ed yesterday where rectal showed no stool in vault and no impaction. X ray non obst pattern.  Has been taking miralax 1 cap per day for about a week or so and gets small amounts of watery stool and no blood.  Also had small bottle of mag citrate at some point.   He is very concerned and feels uncomfortable but no abd pain per se and no vomiting fever dysuria. Review of Systems ? If weight loss: he says yes and wife says no.  No numbness falling memory issues but wife concerned as he is so anxious about this problem.  NO uti sx  Past history family history social history reviewed in the electronic medical record.     Objective:   Physical Exam Physical Exam: Vital signs reviewed GHW:EXHB is a well-developed well-nourished alert cooperative  White male  who appears younger than  stated age in no acute distress. But very anxious about the situation HEENT: normocephalic  atraumatic , Eyes: PERRL, conjunctiva clear, Nares: patent no deformity discharge or tenderness., Ears: no deformity EAC's  NECK: supple without masses, thyromegaly or bruits. CHEST/PULM:  Clear to auscultation and percussion breath sounds equal no wheeze , rales or rhonchi.  CV: PMI is nondisplaced, S1 S2 no gallops, murmurs, rubs. Peripheral pulses are full without delay.No JVD .    ABDOMEN: Bowel sounds normal nontender  No guard or rebound, no hepato splenomegal no CVA tenderness.  No hernia. Extremtities:  No clubbing cyanosis or edema, no acute joint swelling or redness no focal atrophy NEURO:  Oriented x3, cranial nerves 3-12 appear to be intact,  Except for hearing no obvious focal weakness,gait within normal limits   Favors left hip back a bit at times  But balance is very good . SKIN: No acute rashes normal turgor, color, no bruising or petechiae. PSYCH: Oriented, good eye contact very anxious  And worried about the situation  Very talkative , no obvious depression , cognition and judgment appear normal. LN:  No cervical  or inguinal adenopathy    see ed note     Assessment & Plan:  Constipation new onset of great concern and discomfort to patient  But exam in not impressive .    he denies pain and  vomiting   Or other alarm features . No evidence of ischemic bowel or diverticulitis  Has been to 2 other clinicians about this but not yet his pcp ;this is a change in bowel habits for his age but could have been triggered by the narcotic pain meds for his hip back although now off of this .  Ok to continue the stool softener  Enema would not be helpful as there was  not a lot of stool in vault and these procedures have risk risk at this time. Disc at length with both  Wife and pt.  Increase the miralax for the weekend and arrange to get Dr Shela Commons to see him next week for plan.  And evaluation.  Reviewed alarm features again. He has  [place] Anxiety about the situation .   Total visit 40 mins > 50% spent counseling and coordinating care

## 2011-05-10 ENCOUNTER — Telehealth: Payer: Self-pay | Admitting: *Deleted

## 2011-05-10 NOTE — Telephone Encounter (Signed)
Ov on wed. Wife informed

## 2011-05-10 NOTE — Telephone Encounter (Signed)
Wife is calling to ask Dr. Lovell Sheehan to see pt ASAP.  He is getting irritable about his constipation??

## 2011-05-12 ENCOUNTER — Ambulatory Visit (INDEPENDENT_AMBULATORY_CARE_PROVIDER_SITE_OTHER): Payer: Medicare Other | Admitting: Internal Medicine

## 2011-05-12 ENCOUNTER — Encounter: Payer: Self-pay | Admitting: Internal Medicine

## 2011-05-12 VITALS — BP 144/82 | HR 96 | Temp 98.2°F | Resp 16 | Ht 67.0 in | Wt 142.0 lb

## 2011-05-12 DIAGNOSIS — IMO0002 Reserved for concepts with insufficient information to code with codable children: Secondary | ICD-10-CM

## 2011-05-12 DIAGNOSIS — F19988 Other psychoactive substance use, unspecified with other psychoactive substance-induced disorder: Secondary | ICD-10-CM

## 2011-05-12 DIAGNOSIS — K219 Gastro-esophageal reflux disease without esophagitis: Secondary | ICD-10-CM

## 2011-05-12 DIAGNOSIS — F411 Generalized anxiety disorder: Secondary | ICD-10-CM

## 2011-05-12 DIAGNOSIS — K5900 Constipation, unspecified: Secondary | ICD-10-CM

## 2011-05-12 DIAGNOSIS — F419 Anxiety disorder, unspecified: Secondary | ICD-10-CM

## 2011-05-12 LAB — CBC WITH DIFFERENTIAL/PLATELET
Basophils Absolute: 0 10*3/uL (ref 0.0–0.1)
Eosinophils Absolute: 0.2 10*3/uL (ref 0.0–0.7)
HCT: 41.1 % (ref 39.0–52.0)
Hemoglobin: 13.6 g/dL (ref 13.0–17.0)
Lymphs Abs: 2.2 10*3/uL (ref 0.7–4.0)
MCHC: 33.2 g/dL (ref 30.0–36.0)
MCV: 91.4 fl (ref 78.0–100.0)
Monocytes Absolute: 1 10*3/uL (ref 0.1–1.0)
Neutro Abs: 5.5 10*3/uL (ref 1.4–7.7)
Platelets: 178 10*3/uL (ref 150.0–400.0)
RDW: 13.9 % (ref 11.5–14.6)

## 2011-05-12 MED ORDER — METOCLOPRAMIDE HCL 5 MG PO TABS: 2.5000 mg | ORAL_TABLET | Freq: Three times a day (TID) | ORAL | Status: DC

## 2011-05-12 MED ORDER — ALPRAZOLAM 0.5 MG PO TABS: 0.5000 mg | ORAL_TABLET | Freq: Three times a day (TID) | ORAL | Status: AC | PRN

## 2011-05-12 MED ORDER — ALPRAZOLAM 0.5 MG PO TABS: 0.5000 mg | ORAL_TABLET | Freq: Every evening | ORAL | Status: DC | PRN

## 2011-05-12 NOTE — Progress Notes (Signed)
  Subjective:    Patient ID: Don Montoya, male    DOB: 1915/02/05, 77 y.o.   MRN: [id number]  HPI patient presented for constipation   Review of Systems     Objective:   Physical Exam        Assessment & Plan:  due to the patient's dementia he was unable to recognize constipation as etiology for his abdominal pain his wife was instructed in what to give him to help as a laxative

## 2011-05-12 NOTE — Patient Instructions (Addendum)
As the prednisone has caused her bowel to lock up the back it also caused you do have a great deal of fear and anxiety it made you scared inside. The 2 medicines I am giving you      One  is to take care of the anxiety... you take it 3 times a day  Call ativan The second is to help with that gas with your stomach   C;lled reglan  1/2 tablet   Never take prednisone again

## 2011-05-14 ENCOUNTER — Telehealth: Payer: Self-pay | Admitting: *Deleted

## 2011-05-14 NOTE — Telephone Encounter (Signed)
Wife calls stating Pt is groggy and a little confused.  Suggested they can cut back on the Xanax to see if this is doing it.

## 2011-05-14 NOTE — Telephone Encounter (Signed)
Pt is not taking Vicodin.

## 2011-05-14 NOTE — Telephone Encounter (Signed)
Agree, also stop hypdrocodone

## 2011-05-17 ENCOUNTER — Emergency Department (HOSPITAL_COMMUNITY): Payer: Medicare Other

## 2011-05-17 ENCOUNTER — Emergency Department (HOSPITAL_COMMUNITY)
Admission: EM | Admit: 2011-05-17 | Discharge: 2011-05-17 | Disposition: A | Discharge status: Home / Self Care | Payer: Medicare Other | Attending: Emergency Medicine | Admitting: Emergency Medicine

## 2011-05-17 ENCOUNTER — Telehealth: Payer: Self-pay | Admitting: Internal Medicine

## 2011-05-17 DIAGNOSIS — R630 Anorexia: Secondary | ICD-10-CM | POA: Insufficient documentation

## 2011-05-17 DIAGNOSIS — R110 Nausea: Secondary | ICD-10-CM | POA: Insufficient documentation

## 2011-05-17 DIAGNOSIS — R109 Unspecified abdominal pain: Secondary | ICD-10-CM | POA: Insufficient documentation

## 2011-05-17 DIAGNOSIS — Z79899 Other long term (current) drug therapy: Secondary | ICD-10-CM | POA: Insufficient documentation

## 2011-05-17 DIAGNOSIS — N390 Urinary tract infection, site not specified: Secondary | ICD-10-CM | POA: Insufficient documentation

## 2011-05-17 DIAGNOSIS — R10819 Abdominal tenderness, unspecified site: Secondary | ICD-10-CM | POA: Insufficient documentation

## 2011-05-17 DIAGNOSIS — K5900 Constipation, unspecified: Secondary | ICD-10-CM | POA: Insufficient documentation

## 2011-05-17 DIAGNOSIS — I10 Essential (primary) hypertension: Secondary | ICD-10-CM | POA: Insufficient documentation

## 2011-05-17 DIAGNOSIS — E119 Type 2 diabetes mellitus without complications: Secondary | ICD-10-CM | POA: Insufficient documentation

## 2011-05-17 LAB — CBC
HCT: 37.5 % — ABNORMAL LOW (ref 39.0–52.0)
Hemoglobin: 12.8 g/dL — ABNORMAL LOW (ref 13.0–17.0)
MCH: 29.9 pg (ref 26.0–34.0)
MCHC: 34.1 g/dL (ref 30.0–36.0)
MCV: 87.6 fL (ref 78.0–100.0)
Platelets: 222 10*3/uL (ref 150–400)
RBC: 4.28 MIL/uL (ref 4.22–5.81)
RDW: 13 % (ref 11.5–15.5)
WBC: 8.3 10*3/uL (ref 4.0–10.5)

## 2011-05-17 LAB — URINALYSIS, ROUTINE W REFLEX MICROSCOPIC
Bilirubin Urine: NEGATIVE
Glucose, UA: NEGATIVE mg/dL
Ketones, ur: NEGATIVE mg/dL
Nitrite: NEGATIVE
Protein, ur: NEGATIVE mg/dL
Specific Gravity, Urine: 1.011 (ref 1.005–1.030)
Urobilinogen, UA: 0.2 mg/dL (ref 0.0–1.0)
pH: 7.5 (ref 5.0–8.0)

## 2011-05-17 LAB — DIFFERENTIAL
Basophils Absolute: 0.1 10*3/uL (ref 0.0–0.1)
Basophils Relative: 1 % (ref 0–1)
Eosinophils Absolute: 0.4 10*3/uL (ref 0.0–0.7)
Eosinophils Relative: 5 % (ref 0–5)
Lymphocytes Relative: 30 % (ref 12–46)
Lymphs Abs: 2.5 10*3/uL (ref 0.7–4.0)
Monocytes Absolute: 1.1 10*3/uL — ABNORMAL HIGH (ref 0.1–1.0)
Monocytes Relative: 14 % — ABNORMAL HIGH (ref 3–12)
Neutro Abs: 4.2 10*3/uL (ref 1.7–7.7)
Neutrophils Relative %: 51 % (ref 43–77)

## 2011-05-17 LAB — BASIC METABOLIC PANEL
BUN: 13 mg/dL (ref 6–23)
CO2: 28 mEq/L (ref 19–32)
Calcium: 9.5 mg/dL (ref 8.4–10.5)
Chloride: 101 mEq/L (ref 96–112)
Creatinine, Ser: 1.19 mg/dL (ref 0.50–1.35)
GFR calc Af Amer: 58 mL/min — ABNORMAL LOW (ref >90)
GFR calc non Af Amer: 50 mL/min — ABNORMAL LOW (ref >90)
Glucose, Bld: 157 mg/dL — ABNORMAL HIGH (ref 70–99)
Potassium: 4.4 mEq/L (ref 3.5–5.1)
Sodium: 138 mEq/L (ref 135–145)

## 2011-05-17 LAB — URINE MICROSCOPIC-ADD ON

## 2011-05-17 LAB — OCCULT BLOOD, POC DEVICE: Fecal Occult Bld: NEGATIVE

## 2011-05-17 MED ORDER — IOHEXOL 300 MG/ML  SOLN
80.0000 mL | Freq: Once | INTRAMUSCULAR | Status: AC | PRN
  Administered 2011-05-17: 80 mL via INTRAVENOUS

## 2011-05-17 NOTE — Telephone Encounter (Signed)
Pt's wife was wanting to bring pt here for Dr Cato Mulligan to a direct admit.  Pt is having severe abd pain and has been constipated.  Told pts wife that she would need to take him to the ER for evaluation.  Pt's wife is taking him now

## 2011-05-17 NOTE — Telephone Encounter (Signed)
Wife called again and would like for a nurse to return call, asap. Thanks.

## 2011-05-17 NOTE — Telephone Encounter (Signed)
Pts wife called and said that pt is having severe stomach pain and is req to go to [facility]. Pts wife said that she is taking pt to [facility] ER and is req call back from doctor or nurse asap. Pt is aware that Dr Lovell Sheehan is out of the office.

## 2011-05-20 LAB — URINE CULTURE
Colony Count: 60000
Culture  Setup Time: 201210221750

## 2011-06-01 ENCOUNTER — Ambulatory Visit (INDEPENDENT_AMBULATORY_CARE_PROVIDER_SITE_OTHER): Payer: Medicare Other | Admitting: Gastroenterology

## 2011-06-01 ENCOUNTER — Encounter: Payer: Self-pay | Admitting: Gastroenterology

## 2011-06-01 DIAGNOSIS — K5900 Constipation, unspecified: Secondary | ICD-10-CM

## 2011-06-01 NOTE — Progress Notes (Signed)
Review of pertinent gastrointestinal problems: 1. known GE junction stricture (ring vs peptic, dilated  5/10 up to 18mm), recurrent dysphagia; dilated 12/11 up to 19mm with CRE balloon 2. colonoscopy 2007 found diverticulosis, 2 diminutive polyps that were not removed due to age, comorbidities  HPI: This is a 77 year old man whom I last saw about 2 years ago  Had left sided sciatica, was put on narcotic  pain meds.  No BMs for three weeks.  THis has improved, moving his bowels better now.  At one point he went to the hospital for significant constipation, abdominal pains. Lab tests were all normal. He had a CT scan which did not show dramatic stool burden.  He is very hard of hearing. His wife is with him today. I'm not sure exactly how much the 2 of them understand.    Past Medical History  Diagnosis Date  . Diabetes mellitus   . Diverticulosis   . Hypertension   . PVD (peripheral vascular disease)   . Renal insufficiency   . Stroke   . Hyperlipidemia   . GERD (gastroesophageal reflux disease)   . Constipation     Past Surgical History  Procedure Date  . Kidney stone surgery   . Vertebral artery stent     Current Outpatient Prescriptions  Medication Sig Dispense Refill  . ALPRAZolam (XANAX) 0.5 MG tablet Take 1 tablet (0.5 mg total) by mouth 3 (three) times daily as needed for sleep.  90 tablet  1  . aspirin 81 MG tablet Take 81 mg by mouth daily.        . carvedilol (COREG CR) 10 MG 24 hr capsule Take 10 mg by mouth daily.        . clopidogrel (PLAVIX) 75 MG tablet Take 75 mg by mouth daily.        Marland Kitchen glyBURIDE (DIABETA) 2.5 MG tablet Take 2.5 mg by mouth daily with breakfast.        . HYDROcodone-acetaminophen (VICODIN) 5-500 MG per tablet 1-2 tabs po q6h prn pain  40 tablet  0  . metoCLOPramide (REGLAN) 5 MG tablet Take 0.5 tablets (2.5 mg total) by mouth 3 (three) times daily. before meals  90 tablet  1  . Multiple Vitamins-Minerals (ICAPS MV PO) Take by mouth 2 (two)  times daily.        Marland Kitchen omeprazole (PRILOSEC) 20 MG capsule Take 20 mg by mouth daily.        . silodosin (RAPAFLO) 8 MG CAPS capsule Take 1 capsule (8 mg total) by mouth daily with breakfast.  30 capsule  2  . simvastatin (ZOCOR) 80 MG tablet Take 80 mg by mouth at bedtime.          Allergies as of 06/01/2011 - Review Complete 06/01/2011  Allergen Reaction Noted  . Prednisone  05/12/2011    Family History  Problem Relation Age of Onset  . Diabetes Mother   . Heart disease Mother   . Colon cancer Neg Hx     History   Social History  . Marital Status: Married    Spouse Name: N/A    Number of Children: N/A  . Years of Education: N/A   Occupational History  . Retired    Social History Main Topics  . Smoking status: Never Smoker   . Smokeless tobacco: Never Used  . Alcohol Use: No  . Drug Use: No  . Sexually Active: Not Currently   Other Topics Concern  . Not on file  Social History Narrative   Married Independent      Physical Exam: BP 132/76  Pulse 88  Ht 5\' 7"  (1.702 m)  Wt 141 lb (63.957 kg)  BMI 22.08 kg/m2 Constitutional: generally well-appearing Psychiatric: alert and oriented x3 Abdomen: soft, nontender, nondistended, no obvious ascites, no peritoneal signs, normal bowel sounds     Assessment and plan: 77 y.o. male with recent constipation related to narcotic pain medicines  I recommended he try to avoid narcotic pain medicines as best as possible and he should be taking MiraLax on a daily basis rather than just once in a while as he has been doing recently. He knows to call he has a return of his troubles.

## 2011-06-01 NOTE — Patient Instructions (Signed)
You should take miralax one to two scoops every single day.  Only stop if you have diarrhea. Avoid narcotic pain medicines as best that you can. Call if symptoms worsen.

## 2011-07-30 ENCOUNTER — Telehealth: Payer: Self-pay | Admitting: *Deleted

## 2011-07-30 MED ORDER — ALPRAZOLAM 0.5 MG PO TBDP: 0.5000 mg | ORAL_TABLET | Freq: Two times a day (BID) | ORAL | Status: AC | PRN

## 2011-07-30 NOTE — Telephone Encounter (Signed)
Pt wife requesting prescription to be called in to [facility].

## 2011-07-30 NOTE — Telephone Encounter (Signed)
Talked with wife- requesting refill on xanax--called in

## 2011-08-02 DIAGNOSIS — R1084 Generalized abdominal pain: Secondary | ICD-10-CM | POA: Diagnosis not present

## 2011-08-02 DIAGNOSIS — K5900 Constipation, unspecified: Secondary | ICD-10-CM | POA: Diagnosis not present

## 2011-08-06 DIAGNOSIS — K5730 Diverticulosis of large intestine without perforation or abscess without bleeding: Secondary | ICD-10-CM | POA: Diagnosis not present

## 2011-08-06 DIAGNOSIS — R1084 Generalized abdominal pain: Secondary | ICD-10-CM | POA: Diagnosis not present

## 2011-08-23 DIAGNOSIS — K5900 Constipation, unspecified: Secondary | ICD-10-CM | POA: Diagnosis not present

## 2011-08-23 DIAGNOSIS — R1084 Generalized abdominal pain: Secondary | ICD-10-CM | POA: Diagnosis not present

## 2011-09-15 DIAGNOSIS — L570 Actinic keratosis: Secondary | ICD-10-CM | POA: Diagnosis not present

## 2011-09-15 DIAGNOSIS — L259 Unspecified contact dermatitis, unspecified cause: Secondary | ICD-10-CM | POA: Diagnosis not present

## 2011-09-15 DIAGNOSIS — L219 Seborrheic dermatitis, unspecified: Secondary | ICD-10-CM | POA: Diagnosis not present

## 2011-09-27 DIAGNOSIS — K5900 Constipation, unspecified: Secondary | ICD-10-CM | POA: Diagnosis not present

## 2011-09-27 DIAGNOSIS — R1084 Generalized abdominal pain: Secondary | ICD-10-CM | POA: Diagnosis not present

## 2011-10-13 DIAGNOSIS — L259 Unspecified contact dermatitis, unspecified cause: Secondary | ICD-10-CM | POA: Diagnosis not present

## 2011-10-26 ENCOUNTER — Telehealth: Payer: Self-pay | Admitting: *Deleted

## 2011-10-26 MED ORDER — CIPROFLOXACIN HCL 250 MG PO TABS: 250.0000 mg | ORAL_TABLET | Freq: Two times a day (BID) | ORAL | Status: DC

## 2011-10-26 NOTE — Telephone Encounter (Signed)
Pt's wife states that Don Montoya is having severe dysuria today, and feels he needs to be seen today. He is not having any fever, chills or back pain.  Symptoms started yesterday as far as she can tell.

## 2011-10-26 NOTE — Telephone Encounter (Signed)
cipro 250 bid for 10 days per dr Lovell Sheehan

## 2011-10-26 NOTE — Telephone Encounter (Signed)
Pt aware to pick up meds at he pharmacy.

## 2011-10-28 ENCOUNTER — Encounter: Payer: Self-pay | Admitting: Internal Medicine

## 2011-10-28 ENCOUNTER — Ambulatory Visit (INDEPENDENT_AMBULATORY_CARE_PROVIDER_SITE_OTHER): Payer: Medicare Other | Admitting: Internal Medicine

## 2011-10-28 VITALS — BP 160/90 | HR 76 | Temp 98.6°F | Resp 16 | Ht 67.0 in | Wt 146.0 lb

## 2011-10-28 DIAGNOSIS — R634 Abnormal weight loss: Secondary | ICD-10-CM

## 2011-10-28 DIAGNOSIS — I10 Essential (primary) hypertension: Secondary | ICD-10-CM | POA: Diagnosis not present

## 2011-10-28 DIAGNOSIS — IMO0001 Reserved for inherently not codable concepts without codable children: Secondary | ICD-10-CM

## 2011-10-28 DIAGNOSIS — J449 Chronic obstructive pulmonary disease, unspecified: Secondary | ICD-10-CM

## 2011-10-28 MED ORDER — AZITHROMYCIN 250 MG PO TABS: ORAL_TABLET | ORAL | Status: AC

## 2011-10-28 NOTE — Patient Instructions (Signed)
Take delsym liquid for the cough 1 capful twice a day

## 2011-10-28 NOTE — Progress Notes (Signed)
Subjective:    Patient ID: Don Montoya, male    DOB: 10/06/1914, 78 y.o.   MRN: [id number]  HPI Patient is a 78 year old elderly male with mild to moderate dementia significant hearing loss this fall for hypertension gastroesophageal reflux and most recently he was treated for acute prostatitis.  His acute complaint today of a cough and congestion that has been going on for 2-3 days.  He also has macular degeneration which increases his risk for falls   Review of Systems  Constitutional: Positive for unexpected weight change. Negative for fever and fatigue.  HENT: Positive for hearing loss, rhinorrhea and postnasal drip. Negative for congestion and neck pain.   Eyes: Positive for visual disturbance. Negative for discharge and redness.  Respiratory: Positive for cough. Negative for shortness of breath and wheezing.   Cardiovascular: Negative for leg swelling.  Gastrointestinal: Negative for abdominal pain, constipation and abdominal distention.  Genitourinary: Negative for urgency and frequency.  Musculoskeletal: Positive for gait problem. Negative for joint swelling and arthralgias.  Skin: Negative for color change and rash.  Neurological: Positive for weakness. Negative for light-headedness.  Hematological: Negative for adenopathy.  Psychiatric/Behavioral: Negative for behavioral problems.   Past Medical History  Diagnosis Date  . Diabetes mellitus   . Diverticulosis   . Hypertension   . PVD (peripheral vascular disease)   . Renal insufficiency   . Stroke   . Hyperlipidemia   . GERD (gastroesophageal reflux disease)   . Constipation     History   Social History  . Marital Status: Married    Spouse Name: N/A    Number of Children: N/A  . Years of Education: N/A   Occupational History  . Retired    Social History Main Topics  . Smoking status: Never Smoker   . Smokeless tobacco: Never Used  . Alcohol Use: No  . Drug Use: No  . Sexually Active: Not Currently    Other Topics Concern  . Not on file   Social History Narrative   Married Independent    Past Surgical History  Procedure Date  . Kidney stone surgery   . Vertebral artery stent     Family History  Problem Relation Age of Onset  . Diabetes Mother   . Heart disease Mother   . Colon cancer Neg Hx     Allergies  Allergen Reactions  . Prednisone     psycosis    Current Outpatient Prescriptions on File Prior to Visit  Medication Sig Dispense Refill  . aspirin 81 MG tablet Take 81 mg by mouth daily.        . carvedilol (COREG CR) 10 MG 24 hr capsule Take 10 mg by mouth daily.        . clopidogrel (PLAVIX) 75 MG tablet Take 75 mg by mouth daily.        Marland Kitchen glyBURIDE (DIABETA) 2.5 MG tablet Take 2.5 mg by mouth daily with breakfast.        . HYDROcodone-acetaminophen (VICODIN) 5-500 MG per tablet 1-2 tabs po q6h prn pain  40 tablet  0  . metoCLOPramide (REGLAN) 5 MG tablet Take 0.5 tablets (2.5 mg total) by mouth 3 (three) times daily. before meals  90 tablet  1  . Multiple Vitamins-Minerals (ICAPS MV PO) Take by mouth 2 (two) times daily.        Marland Kitchen omeprazole (PRILOSEC) 20 MG capsule Take 20 mg by mouth daily.        . silodosin (RAPAFLO) 8 MG  CAPS capsule Take 1 capsule (8 mg total) by mouth daily with breakfast.  30 capsule  2  . simvastatin (ZOCOR) 80 MG tablet Take 80 mg by mouth at bedtime.          BP 160/90  Pulse 76  Temp 98.6 F (37 C)  Resp 16  Ht 5\' 7"  (1.702 m)  Wt 146 lb (66.225 kg)  BMI 22.87 kg/m2       Objective:   Physical Exam  Constitutional: He appears well-developed and well-nourished.  HENT:  Head: Normocephalic and atraumatic.       Moderate erythema with posterior cobblestoning lung fields were clear  Eyes: Conjunctivae are normal. Pupils are equal, round, and reactive to light.  Neck: Normal range of motion. Neck supple.  Cardiovascular: Normal rate and regular rhythm.   Pulmonary/Chest: Effort normal and breath sounds normal.   Abdominal: Soft. Bowel sounds are normal.          Assessment & Plan:  Acute bronchitis will be treated with a Z-Pak.  Blood pressure is stable on current medications recheck is blood pressure was 140/88.  Diabetes appears to be stable on dietary control weight has now stabilized.  Macular degeneration and hearing loss increased fall risks Discussed this with his wife

## 2011-11-04 DIAGNOSIS — H3530 Unspecified macular degeneration: Secondary | ICD-10-CM | POA: Diagnosis not present

## 2011-11-04 DIAGNOSIS — H40059 Ocular hypertension, unspecified eye: Secondary | ICD-10-CM | POA: Diagnosis not present

## 2011-11-04 DIAGNOSIS — E119 Type 2 diabetes mellitus without complications: Secondary | ICD-10-CM | POA: Diagnosis not present

## 2011-11-04 DIAGNOSIS — Z961 Presence of intraocular lens: Secondary | ICD-10-CM | POA: Diagnosis not present

## 2011-11-05 ENCOUNTER — Encounter (HOSPITAL_BASED_OUTPATIENT_CLINIC_OR_DEPARTMENT_OTHER): Payer: Self-pay | Admitting: *Deleted

## 2011-11-05 ENCOUNTER — Emergency Department (INDEPENDENT_AMBULATORY_CARE_PROVIDER_SITE_OTHER): Payer: Medicare Other

## 2011-11-05 ENCOUNTER — Emergency Department (HOSPITAL_BASED_OUTPATIENT_CLINIC_OR_DEPARTMENT_OTHER)
Admission: EM | Admit: 2011-11-05 | Discharge: 2011-11-05 | Disposition: A | Discharge status: Home / Self Care | Payer: Medicare Other | Attending: Emergency Medicine | Admitting: Emergency Medicine

## 2011-11-05 DIAGNOSIS — K439 Ventral hernia without obstruction or gangrene: Secondary | ICD-10-CM | POA: Diagnosis not present

## 2011-11-05 DIAGNOSIS — I10 Essential (primary) hypertension: Secondary | ICD-10-CM | POA: Diagnosis not present

## 2011-11-05 DIAGNOSIS — E785 Hyperlipidemia, unspecified: Secondary | ICD-10-CM | POA: Diagnosis not present

## 2011-11-05 DIAGNOSIS — Z8673 Personal history of transient ischemic attack (TIA), and cerebral infarction without residual deficits: Secondary | ICD-10-CM | POA: Diagnosis not present

## 2011-11-05 DIAGNOSIS — E119 Type 2 diabetes mellitus without complications: Secondary | ICD-10-CM | POA: Insufficient documentation

## 2011-11-05 DIAGNOSIS — K4020 Bilateral inguinal hernia, without obstruction or gangrene, not specified as recurrent: Secondary | ICD-10-CM | POA: Diagnosis not present

## 2011-11-05 DIAGNOSIS — K4090 Unilateral inguinal hernia, without obstruction or gangrene, not specified as recurrent: Secondary | ICD-10-CM | POA: Diagnosis not present

## 2011-11-05 DIAGNOSIS — R109 Unspecified abdominal pain: Secondary | ICD-10-CM | POA: Insufficient documentation

## 2011-11-05 DIAGNOSIS — K469 Unspecified abdominal hernia without obstruction or gangrene: Secondary | ICD-10-CM | POA: Diagnosis not present

## 2011-11-05 DIAGNOSIS — R319 Hematuria, unspecified: Secondary | ICD-10-CM | POA: Insufficient documentation

## 2011-11-05 LAB — DIFFERENTIAL
Basophils Absolute: 0.1 10*3/uL (ref 0.0–0.1)
Basophils Relative: 1 % (ref 0–1)
Eosinophils Absolute: 0.2 10*3/uL (ref 0.0–0.7)
Eosinophils Relative: 2 % (ref 0–5)
Lymphocytes Relative: 32 % (ref 12–46)
Monocytes Absolute: 1 10*3/uL (ref 0.1–1.0)

## 2011-11-05 LAB — URINALYSIS, ROUTINE W REFLEX MICROSCOPIC
Bilirubin Urine: NEGATIVE
Ketones, ur: NEGATIVE mg/dL
Nitrite: NEGATIVE
pH: 6 (ref 5.0–8.0)

## 2011-11-05 LAB — CBC
HCT: 41.1 % (ref 39.0–52.0)
MCH: 29.9 pg (ref 26.0–34.0)
MCHC: 34.5 g/dL (ref 30.0–36.0)
MCV: 86.5 fL (ref 78.0–100.0)
RDW: 13.8 % (ref 11.5–15.5)

## 2011-11-05 LAB — LACTIC ACID, PLASMA: Lactic Acid, Venous: 1.2 mmol/L (ref 0.5–2.2)

## 2011-11-05 LAB — BASIC METABOLIC PANEL
CO2: 25 mEq/L (ref 19–32)
Calcium: 9.6 mg/dL (ref 8.4–10.5)
Creatinine, Ser: 1.3 mg/dL (ref 0.50–1.35)

## 2011-11-05 LAB — URINE MICROSCOPIC-ADD ON

## 2011-11-05 MED ORDER — IOHEXOL 300 MG/ML  SOLN
100.0000 mL | Freq: Once | INTRAMUSCULAR | Status: AC | PRN
  Administered 2011-11-05: 100 mL via INTRAVENOUS

## 2011-11-05 MED ORDER — MORPHINE SULFATE 2 MG/ML IJ SOLN
2.0000 mg | Freq: Once | INTRAMUSCULAR | Status: DC
  Filled 2011-11-05: qty 1

## 2011-11-05 MED ORDER — IOHEXOL 300 MG/ML  SOLN
20.0000 mL | INTRAMUSCULAR | Status: AC
  Administered 2011-11-05 (×2): 20 mL via ORAL

## 2011-11-05 NOTE — ED Notes (Signed)
Pain in his right lower quadrant. States he thinks his hernia repair has "busted loose". Pt is hard of hearing and it is hard to communicate with him.

## 2011-11-05 NOTE — Discharge Instructions (Signed)
Call on Monday for appointment with general surgeon. Return immediately to ER for worsening pain, vomiting, constipation/diarrhea or if you are concerned.  Hernia A hernia happens when an organ inside your body pushes out through a weak spot in your belly (abdominal) wall. Most hernias get worse over time. They can often be pushed back into place (reduced). Surgery may be needed to repair hernias that cannot be pushed into place. HOME CARE  Keep doing normal activities.   Avoid lifting more than 10 pounds (4.5 kilograms).   Cough gently and avoid straining. Over time, these things will:   Increase your hernia size.   Irritate your hernia.   Break down hernia repairs.   Stop smoking.   Do not wear anything tight over your hernia. Do not keep the hernia in with an outside bandage.   Eat food that is high in fiber (fruit, vegetables, whole grains).   Drink enough fluids to keep your pee (urine) clear or pale yellow.   Take medicines to make your poop soft (stool softeners) if you cannot poop (constipated).  GET HELP RIGHT AWAY IF:   You have a fever.   You have belly pain that gets worse.   You feel sick to your stomach (nauseous) and throw up (vomit).   Your skin starts to bulge out.   Your hernia turns a different color, feels hard, or is tender.   You have increased pain or puffiness (swelling) around the hernia.   You poop more or less often.   Your poop does not look the way normally does.   You have watery poop (diarrhea).   You cannot push the hernia back in place by applying gentle pressure while lying down.  MAKE SURE YOU:   Understand these instructions.   Will watch your condition.   Will get help right away if you are not doing well or get worse.  Document Released: 12/30/2009 Document Revised: 07/01/2011 Document Reviewed: 12/30/2009 [facility] Patient Information 2012 [place], [place].  RESOURCE GUIDE  Dental Problems  Patients with  Medicaid: [facility] [phone] [address]                                           [phone] [address] Phone:  [phone]                                                   Phone:  [phone]  If unable to pay or uninsured, contact:  [facility] or [facility]. to become qualified for the adult dental clinic.  Chronic Pain Problems Contact [facility]  [phone] Patients need to be referred by their primary care doctor.  Insufficient Money for Medicine Contact [employer] Way:  call "211" or [facility] [phone].  No Primary Care Doctor Call [facility]  [phone] Other agencies that provide inexpensive medical care    [facility]  [phone]    [facility]  [phone]    [facility]  [phone]    [facility]  [phone]    Planned  Parenthood  [phone]    [facility]  [phone]  Psychological Services [facility]  [phone] [facility]  [phone] [facility]   [phone] (emergency services [phone])  Abuse/Neglect [facility] Child Abuse Hotline (219)826-2368 [facility] Child Abuse Hotline (628)457-5987 (After Hours)  Emergency Shelter [facility] [phone]  Maternity Homes [facility] [place] [facility] [phone] [facility] [phone]  MRSA Hotline #:   781-039-0031    [facility] Resources  [facility] [place] [facility]  [employer]                           [facility] Dept. [address] [place]                     [address]         [address]  Blondell Reveal Phone:  [phone]                                  Phone:  [phone]                   Phone:  [phone]  [facility] Phone:   [phone]  [facility] Child Abuse Hotline 365-708-9024 [phone] (After Hours)

## 2011-11-05 NOTE — ED Provider Notes (Signed)
History     CSN: [id number]  Arrival date & time 11/05/11  1431   First MD Initiated Contact with Patient 11/05/11 1503      Chief Complaint  Patient presents with  . Abdominal Pain    (Consider location/radiation/quality/duration/timing/severity/associated sxs/prior treatment) HPI  89yoM pw abdominal pain. States that he has experienced RLQ pain x "a couple days", worse today. C/O bulge RLQ as well. States that he has h/o hernia repair and "now it's busted loose". Worse with standing/walking. Reduces when lying down. C/O 7/10 pain at this time (supine position), has not taken anything for pain. Denies fever/chills/n/v/constipation/diarrhea. Denies hematuria/dysuria/freq/urgency. Denies back pain.     ED Notes, ED Provider Notes from 11/05/11 0000 to 11/05/11 14:45:20       Julieanne Manson, RN 11/05/2011 14:43      Pain in his right lower quadrant. States he thinks his hernia repair has "busted loose". Pt is hard of hearing and it is hard to communicate with him.     Past Medical History  Diagnosis Date  . Diabetes mellitus   . Diverticulosis   . Hypertension   . PVD (peripheral vascular disease)   . Renal insufficiency   . Stroke   . Hyperlipidemia   . GERD (gastroesophageal reflux disease)   . Constipation     Past Surgical History  Procedure Date  . Kidney stone surgery   . Vertebral artery stent   . Hernia repair     Family History  Problem Relation Age of Onset  . Diabetes Mother   . Heart disease Mother   . Colon cancer Neg Hx     History  Substance Use Topics  . Smoking status: Never Smoker   . Smokeless tobacco: Never Used  . Alcohol Use: No    Review of Systems  All other systems reviewed and are negative.  except as noted HPI   Allergies  Prednisone  Home Medications   Current Outpatient Rx  Name Route Sig Dispense Refill  . ASPIRIN 81 MG PO TABS Oral Take 81 mg by mouth daily.      Marland Kitchen CARVEDILOL PHOSPHATE ER 10 MG PO CP24 Oral Take  10 mg by mouth daily.      Marland Kitchen CIPROFLOXACIN HCL 250 MG PO TABS Oral Take 1 tablet by mouth Daily.    Marland Kitchen CLOPIDOGREL BISULFATE 75 MG PO TABS Oral Take 75 mg by mouth daily.      . GLYBURIDE 2.5 MG PO TABS Oral Take 2.5 mg by mouth daily with breakfast.      . HYDROCODONE-ACETAMINOPHEN 5-500 MG PO TABS  1-2 tabs po q6h prn pain 40 tablet 0  . METOCLOPRAMIDE HCL 5 MG PO TABS Oral Take 0.5 tablets (2.5 mg total) by mouth 3 (three) times daily. before meals 90 tablet 1  . ICAPS MV PO Oral Take by mouth 2 (two) times daily.      Marland Kitchen SILODOSIN 8 MG PO CAPS Oral Take 1 capsule (8 mg total) by mouth daily with breakfast. 30 capsule 2  . SIMVASTATIN 80 MG PO TABS Oral Take 80 mg by mouth at bedtime.        BP 144/74  Pulse 98  Temp(Src) 97.9 F (36.6 C) (Oral)  Resp 20  SpO2 100%  Physical Exam  Nursing note and vitals reviewed. Constitutional: He is oriented to person, place, and time. He appears well-developed and well-nourished. No distress.  HENT:  Head: Atraumatic.  Mouth/Throat: Oropharynx is clear and moist.  Eyes:  Conjunctivae are normal. Pupils are equal, round, and reactive to light.  Neck: Neck supple.  Cardiovascular: Normal rate, regular rhythm, normal heart sounds and intact distal pulses.  Exam reveals no gallop and no friction rub.   No murmur heard. Pulmonary/Chest: Effort normal. No respiratory distress. He has no wheezes. He has no rales.  Abdominal: Soft. Bowel sounds are normal. There is no tenderness. There is no rebound and no guarding.       RLQ/just above inguinal ligament with small palpable hernia, easily reducible. Increases in size with standing/coughing. No overlying skin changes  Genitourinary:       External genitalia unremarkable No scrotal swelling/ttp  Musculoskeletal: Normal range of motion. He exhibits no edema and no tenderness.  Neurological: He is alert and oriented to person, place, and time.  Skin: Skin is warm and dry.  Psychiatric: He has a normal  mood and affect.    ED Course  Procedures (including critical care time)  Labs Reviewed  CBC - Abnormal; Notable for the following:    WBC 11.0 (*)    All other components within normal limits  BASIC METABOLIC PANEL - Abnormal; Notable for the following:    Glucose, Bld 116 (*)    GFR calc non Af Amer 45 (*)    GFR calc Af Amer 52 (*)    All other components within normal limits  URINALYSIS, ROUTINE W REFLEX MICROSCOPIC - Abnormal; Notable for the following:    Hgb urine dipstick TRACE (*)    All other components within normal limits  DIFFERENTIAL  LACTIC ACID, PLASMA  URINE MICROSCOPIC-ADD ON   Ct Abdomen Pelvis W Contrast  11/05/2011  *RADIOLOGY REPORT*  Clinical Data: Lower abdominal pain, remote history of hernia repair  CT ABDOMEN AND PELVIS WITH CONTRAST  Technique:  Multidetector CT imaging of the abdomen and pelvis was performed following the standard protocol during bolus administration of intravenous contrast.  Contrast: OMNIPAQUE IOHEXOL 300 MG/ML  SOLN  Comparison: 05/17/2011  Findings: Lung bases are notable for calcified pleural plaques. Stable 7 mm right middle lobe nodule (series 4/image 7).  Liver, spleen, pancreas, and adrenal glands are within normal limits.  Gallbladder is underdistended.  No intrahepatic or extrahepatic ductal dilatation.  Bilateral renal cysts.  Left extrarenal pelvis.  No hydronephrosis.  No evidence of bowel obstruction.  Colonic diverticulosis, without associated inflammatory changes.  Atherosclerotic calcifications of the abdominal aorta and branch vessels.  No abdominopelvic ascites.  No suspicious abdominopelvic lymphadenopathy.  Prostatomegaly, measuring 4.9 cm in transverse dimension.  Bladder is mildly thick-walled but underdistended.  Small left lateral abdominal wall hernia (series 2/image 61), new/increased, containing a loop of nondilated small bowel.  Tiny left and small right fat containing inguinal hernias, unchanged.  Degenerative  changes of the visualized thoracolumbar spine, most prominent at L2-3.  IMPRESSION: No evidence of bowel obstruction.  Small left lateral abdominal wall hernia, new/increased.  Tiny left and small right fat containing inguinal hernias, unchanged.  Additional stable ancillary findings as above.  Original Report Authenticated By: Charline Bills, M.D.    1. Hematuria - cause not known   2. Hernia     MDM  Reducible hernia. Although easily reducible, patient does continue to complain of discomfort. Will check labs including lactic acid. CT A/P. If no complications, plan to discharge home with surgery referral for elective surgery. Patient declining pain medication in ED.  Hematuria unk cause without ARI. CT AP as above. R hernia reduced. Small Left lateral abdominal wall  hernia per CT. Requestioned and examined patient, he does not have Lt abdominal pain. On re-examination he does have a small hernia without overlying skin changes, no ttp, and easily reducible. Patient to f/u with general surgery as outpatient for elective repair. Given strict precautions for return.        Forbes Cellar, MD 11/05/11 [phone]

## 2011-11-05 NOTE — ED Notes (Signed)
Pt. Refused the morphine.  Returned the morphine to the [facility]

## 2011-11-08 ENCOUNTER — Telehealth: Payer: Self-pay | Admitting: *Deleted

## 2011-11-08 NOTE — Telephone Encounter (Signed)
Per dr Lovell Sheehan- that is fine

## 2011-11-08 NOTE — Telephone Encounter (Signed)
Wife called to let us know that pt had to go to Urgent Care this weekend, and has been referred to Dr. Jamey Ripa for repair of ing. Hernia.  Is asking if that will be ok with Dr. Lovell Sheehan?

## 2011-11-08 NOTE — Telephone Encounter (Signed)
Notified pt. 

## 2011-11-12 ENCOUNTER — Telehealth: Payer: Self-pay | Admitting: Internal Medicine

## 2011-11-12 MED ORDER — CLOPIDOGREL BISULFATE 75 MG PO TABS: 75.0000 mg | ORAL_TABLET | Freq: Every day | ORAL | Status: DC

## 2011-11-12 NOTE — Telephone Encounter (Signed)
rx sent in electronically 

## 2011-11-12 NOTE — Telephone Encounter (Signed)
Pts called and and is needing to get refill of 90 day supply clopidogrel (PLAVIX) 75 MG tablet to [facility] Fax # [phone].  Pt is completely out of med.

## 2011-11-30 ENCOUNTER — Encounter (INDEPENDENT_AMBULATORY_CARE_PROVIDER_SITE_OTHER): Payer: Self-pay | Admitting: General Surgery

## 2011-11-30 ENCOUNTER — Ambulatory Visit (INDEPENDENT_AMBULATORY_CARE_PROVIDER_SITE_OTHER): Payer: Medicare Other | Admitting: General Surgery

## 2011-11-30 VITALS — BP 162/80 | HR 89 | Temp 98.0°F | Ht 67.0 in | Wt 150.2 lb

## 2011-11-30 DIAGNOSIS — K4020 Bilateral inguinal hernia, without obstruction or gangrene, not specified as recurrent: Secondary | ICD-10-CM | POA: Diagnosis not present

## 2011-11-30 NOTE — Patient Instructions (Signed)
Call us if the hernias interfere with your normal activities.

## 2011-11-30 NOTE — Progress Notes (Signed)
Patient ID: Don Montoya, male   DOB: 1914/12/19, 78 y.o.   MRN: [id number]  Chief Complaint  Patient presents with  . Pre-op Exam    eval [facility]    HPI Don Montoya is a 78 y.o. male.   HPI  He was referred by Dr. Lovell Montoya for an inguinal hernia.  Many years ago, he had a right inguinal hernia repair and a left groin exploration. He's been having a funny sensation down in the right groin area. He underwent a CT scan which demonstrated bilateral inguinal hernias containing small amounts of fat no intestine. He is here for evaluation of his inguinal hernias. He does have problems with straining to urinate and constipation at times. He states he is able to do what he wants to do without any discomfort at this time.  Past Medical History  Diagnosis Date  . Diabetes mellitus   . Diverticulosis   . Hypertension   . PVD (peripheral vascular disease)   . Renal insufficiency   . Stroke   . Hyperlipidemia   . GERD (gastroesophageal reflux disease)   . Constipation     Past Surgical History  Procedure Date  . Kidney stone surgery   . Vertebral artery stent   . Hernia repair     right inguinal  . Left groin exploration     Family History  Problem Relation Age of Onset  . Diabetes Mother   . Heart disease Mother   . Colon cancer Neg Hx     Social History History  Substance Use Topics  . Smoking status: Never Smoker   . Smokeless tobacco: Never Used  . Alcohol Use: No    Allergies  Allergen Reactions  . Prednisone     psycosis    Current Outpatient Prescriptions  Medication Sig Dispense Refill  . aspirin 81 MG tablet Take 81 mg by mouth daily.        . carvedilol (COREG CR) 10 MG 24 hr capsule Take 10 mg by mouth daily.        . ciprofloxacin (CIPRO) 250 MG tablet Take 1 tablet by mouth Daily.      . clopidogrel (PLAVIX) 75 MG tablet Take 1 tablet (75 mg total) by mouth daily.  90 tablet  0  . glyBURIDE (DIABETA) 2.5 MG tablet Take 2.5 mg by mouth daily with breakfast.         . HYDROcodone-acetaminophen (VICODIN) 5-500 MG per tablet 1-2 tabs po q6h prn pain  40 tablet  0  . metoCLOPramide (REGLAN) 5 MG tablet Take 0.5 tablets (2.5 mg total) by mouth 3 (three) times daily. before meals  90 tablet  1  . Multiple Vitamins-Minerals (ICAPS MV PO) Take by mouth 2 (two) times daily.        . silodosin (RAPAFLO) 8 MG CAPS capsule Take 1 capsule (8 mg total) by mouth daily with breakfast.  30 capsule  2  . simvastatin (ZOCOR) 80 MG tablet Take 80 mg by mouth at bedtime.          Review of Systems Review of Systems  Constitutional: Negative.   HENT: Positive for hearing loss.   Respiratory: Positive for cough.   Gastrointestinal: Positive for constipation.  Genitourinary: Positive for difficulty urinating.  Musculoskeletal: Positive for arthralgias (knee pain).  Neurological:       Sciatic nerve pain on left    Blood pressure 162/80, pulse 89, temperature 98 F (36.7 C), temperature source Temporal, height 5\' 7"  (1.702 m),  weight 150 lb 3.2 oz (68.13 kg), SpO2 98.00%.  Physical Exam Physical Exam  Constitutional:       A thin elderly male in no acute distress.  Cardiovascular: Normal rate and regular rhythm.   Pulmonary/Chest: Effort normal and breath sounds normal.  Abdominal: He exhibits no distension and no mass. There is no tenderness.       Left lower quadrant scar  Genitourinary:       Bilateral groin scars. Bilateral groin bulges that are easily reducible.    Data Reviewed CT scan.  Dr. Lovell Montoya note.  Assessment    Bilateral inguinal hernias containing fat with the right side being recurrent.  Minimally symptomatic at this time.    Plan    I feel Don Montoya would be at higher risk to have a complication post surgery. These are fat containing hernias so there be no risk of intestinal incarceration and strangulate him. I told him I did not recommend surgery unless the hernias severely restricted his lifestyle.       Don Montoya  J 11/30/2011, 10:09 AM

## 2011-12-07 ENCOUNTER — Ambulatory Visit (INDEPENDENT_AMBULATORY_CARE_PROVIDER_SITE_OTHER): Payer: Medicare Other | Admitting: Family Medicine

## 2011-12-07 VITALS — BP 184/104 | HR 101 | Temp 97.6°F | Resp 18 | Ht 66.0 in | Wt 149.0 lb

## 2011-12-07 DIAGNOSIS — E119 Type 2 diabetes mellitus without complications: Secondary | ICD-10-CM

## 2011-12-07 DIAGNOSIS — N400 Enlarged prostate without lower urinary tract symptoms: Secondary | ICD-10-CM

## 2011-12-07 DIAGNOSIS — R300 Dysuria: Secondary | ICD-10-CM | POA: Diagnosis not present

## 2011-12-07 LAB — POCT UA - MICROSCOPIC ONLY: Casts, Ur, LPF, POC: NEGATIVE

## 2011-12-07 LAB — POCT URINALYSIS DIPSTICK
Bilirubin, UA: NEGATIVE
Blood, UA: NEGATIVE
Glucose, UA: NEGATIVE
Spec Grav, UA: 1.015

## 2011-12-07 MED ORDER — SULFAMETHOXAZOLE-TRIMETHOPRIM 800-160 MG PO TABS: 1.0000 | ORAL_TABLET | Freq: Two times a day (BID) | ORAL | Status: AC

## 2011-12-07 MED ORDER — TAMSULOSIN HCL 0.4 MG PO CAPS: 0.4000 mg | ORAL_CAPSULE | Freq: Every day | ORAL | Status: DC
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
## 2011-12-07 NOTE — Patient Instructions (Signed)
Benign Prostatic Hyperplasia You have an enlarged prostate. This is common in elderly males. It is called BPH. This stands for benign prostate hyperplasia. The prostate gland is located in base of the bladder. When it grows, the prostate blocks the urethra. This is the tube which drains urine from the bladder.  SYMPTOMS  Weak urine stream.   Dribbling.   Feeling like the bladder has not emptied completely.   Difficulty starting urination.   Getting up frequently at night to urinate.   Urinating more frequently during the day.  Complete urinary blockage or severe pain with urination requires immediate attention. DIAGNOSIS   Your caregiver often has a good idea what is wrong by taking a history and doing a physical exam.   Special x-rays may be done.  TREATMENT   For mild problems, no treatment may be necessary.   If the problems are moderate, medications may provide relief. Some of these work by making the prostate gland smaller. The herb saw palmetto is commonly used.   If complete blockage occurs, a Foley catheter is usually left in place for a few days.   Surgery is often needed for more severe problems. TURP is the prostate surgery for BPH which is done through the urethra. TURP stands for transurethral resection of the prostate. It involves cutting away chips from the prostate. It is done by removing chips so that they can come out through the penis.   Techniques using heat, microwave and laser to remove the prostate blockage are also being used.  HOME CARE INSTRUCTIONS   Give yourself time when you urinate.   Stay away from alcohol.   Beverages containing caffeine such as coffee, tea and colas can make the problems worse.   Decongestants, antihistamines, and some prescription medicines can also make the problem worse.   Follow up with your caregiver for further treatment as recommended.  SEEK IMMEDIATE MEDICAL CARE IF:   You develop increased pain with urination or  are unable to pass your water.   You develop severe abdominal pain, vomiting, a high fever, or fainting.   You develop back pain or blood in your urine.  MAKE SURE YOU:   Understand these instructions.   Will watch your condition.   Will get help right away if you are not doing well or get worse.  Document Released: 07/12/2005 Document Revised: 07/01/2011 Document Reviewed: 03/17/2007 [employer] Patient Information 2012 [employer]. 

## 2011-12-09 LAB — URINE CULTURE: Colony Count: 2000

## 2012-01-15 ENCOUNTER — Encounter: Payer: Self-pay | Admitting: Family Medicine

## 2012-01-15 ENCOUNTER — Ambulatory Visit (INDEPENDENT_AMBULATORY_CARE_PROVIDER_SITE_OTHER): Payer: Medicare Other | Admitting: Family Medicine

## 2012-01-15 VITALS — BP 160/90 | Temp 98.0°F | Wt 146.0 lb

## 2012-01-15 DIAGNOSIS — N401 Enlarged prostate with lower urinary tract symptoms: Secondary | ICD-10-CM | POA: Diagnosis not present

## 2012-01-15 DIAGNOSIS — R300 Dysuria: Secondary | ICD-10-CM | POA: Diagnosis not present

## 2012-01-15 DIAGNOSIS — R339 Retention of urine, unspecified: Secondary | ICD-10-CM | POA: Diagnosis not present

## 2012-01-15 DIAGNOSIS — N400 Enlarged prostate without lower urinary tract symptoms: Secondary | ICD-10-CM

## 2012-01-15 DIAGNOSIS — I10 Essential (primary) hypertension: Secondary | ICD-10-CM

## 2012-01-15 LAB — POCT URINALYSIS DIPSTICK
Glucose, UA: NEGATIVE
Nitrite, UA: NEGATIVE
Urobilinogen, UA: 0.2

## 2012-01-15 MED ORDER — TAMSULOSIN HCL 0.4 MG PO CAPS: 0.4000 mg | ORAL_CAPSULE | Freq: Every day | ORAL | Status: DC

## 2012-01-15 MED ORDER — SIMVASTATIN 80 MG PO TABS: 80.0000 mg | ORAL_TABLET | Freq: Every day | ORAL | Status: DC

## 2012-01-15 MED ORDER — CARVEDILOL PHOSPHATE ER 10 MG PO CP24: 10.0000 mg | ORAL_CAPSULE | Freq: Every day | ORAL | Status: DC

## 2012-01-15 NOTE — Progress Notes (Signed)
  Subjective:    Patient ID: Don Montoya, male    DOB: 02/10/1915, 78 y.o.   MRN: [id number]  HPI CC: trouble voiding for weeks  Pleasant 78yo with h/o DM, diverticulosis, HTN, CRI, CVA presents with trouble voiding for several weeks.  Worse at night.  Some obstruction sxs.  Endorses dysuria with voiding.    Denies h/o prostate problems.  Denies fevers/chills, nausea/vomiting, flank pain, abd pain.  Has upcoming appt with Dr. Lovell Sheehan in 2 wks.  Pending knee surgery.  Has had issues with constipation over last few months.  Reviewing meds - nonadherent to med - pretty much only meds he's taking are aspirin, clopidogrel, and glyburide.  States "ran out" of coreg, cipro, reglan, rapaflo, zocor, and flomax and did not refill.  Has vicodin at home for knee, but has not been taking because caused significant constipation.  Lab Results  Component Value Date   CREATININE 1.30 11/05/2011   BP Readings from Last 3 Encounters:  01/15/12 160/90  12/07/11 184/104  11/30/11 162/80  states tends to run high at doctor's office, but normal at home. Broke cuff at home, needs new one.  Past Medical History  Diagnosis Date  . Diabetes mellitus   . Diverticulosis   . Hypertension   . PVD (peripheral vascular disease)   . Renal insufficiency   . Stroke   . Hyperlipidemia   . GERD (gastroesophageal reflux disease)   . Constipation     Review of Systems Per HPI    Objective:   Physical Exam  Nursing note and vitals reviewed. Constitutional: He appears well-developed and well-nourished. No distress.       Hard of hearing  HENT:  Head: Normocephalic and atraumatic.  Mouth/Throat: Oropharynx is clear and moist. No oropharyngeal exudate.  Cardiovascular: Normal rate, regular rhythm, normal heart sounds and intact distal pulses.   No murmur heard. Pulmonary/Chest: Effort normal and breath sounds normal. No respiratory distress. He has no wheezes. He has no rales.  Abdominal: Soft. Bowel  sounds are normal. He exhibits no distension. There is no hepatosplenomegaly. There is no tenderness. There is no rebound, no guarding and no CVA tenderness.  Musculoskeletal: He exhibits no edema.  Skin: Skin is warm and dry. No rash noted.       Assessment & Plan:

## 2012-01-15 NOTE — Patient Instructions (Addendum)
Keep an eye on blood pressure at home - if consistently >140/90, please return to see [place].  Buy new blood pressure cuff. I don't think there's infection today. Start FLOMAX - this will help with urinary retention - help you urinate. Take simvastatin (zocor) and carvedilol (coreg) DAILY - these are daily medicines for blood pressure and cholesterol. Try to avoid vicodin as this can stop you up more (constipation and urinating). Good to meet you today, call us with questions Keep appointment with Dr. Lovell Sheehan

## 2012-01-16 NOTE — Assessment & Plan Note (Signed)
HTNsive today but anticipate due to nonadherence with coreg - rec restart. Has f/u with PCP 01/26/2012.

## 2012-01-16 NOTE — Addendum Note (Signed)
Addended by: Eustaquio Boyden on: 01/16/2012 08:55 AM   Modules accepted: Level of Service

## 2012-01-16 NOTE — Assessment & Plan Note (Signed)
UA overall normal today (except for trace protein). Doubt infection, anticipate BPH contributing to urinary obstruction. Has been off flomax and rapaflo. Will restart flomax today, discussed importance of this med for sxs relief. F/u with PCP, update [place] if med not helping.

## 2012-01-26 ENCOUNTER — Ambulatory Visit (INDEPENDENT_AMBULATORY_CARE_PROVIDER_SITE_OTHER): Payer: Medicare Other | Admitting: Internal Medicine

## 2012-01-26 ENCOUNTER — Encounter: Payer: Self-pay | Admitting: Internal Medicine

## 2012-01-26 VITALS — BP 110/70 | HR 72 | Temp 98.2°F | Resp 16 | Ht 66.0 in | Wt 150.0 lb

## 2012-01-26 DIAGNOSIS — I10 Essential (primary) hypertension: Secondary | ICD-10-CM

## 2012-01-26 DIAGNOSIS — N401 Enlarged prostate with lower urinary tract symptoms: Secondary | ICD-10-CM | POA: Diagnosis not present

## 2012-01-26 DIAGNOSIS — L299 Pruritus, unspecified: Secondary | ICD-10-CM

## 2012-01-26 MED ORDER — LEVOCETIRIZINE DIHYDROCHLORIDE 5 MG PO TABS: 5.0000 mg | ORAL_TABLET | Freq: Two times a day (BID) | ORAL | Status: DC

## 2012-01-26 NOTE — Progress Notes (Signed)
  Subjective:    Patient ID: Don Montoya, male    DOB: [birth year], 78 y.o.   MRN: [id number]  HPI  Don Montoya is a 78 year old male who presents for followup of his blood pressure 4 benign prostatic hypertrophy with nocturia urinary. He states that he was having trouble urinating at night that he would awaken attempt to urinate it would take him several hours to be able to completely void.  He was seen on a Saturday clinic and started on Flomax which has significantly alleviated the initiation symptoms but he still has to her 3 times in which she wakes up to urinate.  He should also has extremely itchy skin he has been given steroid creams by the dermatologist this has not helped or at least only helped temporarily he reports symptoms of the rash that appears after he scratches which is consistent with dermatographia some  Review of Systems     Objective:   Physical Exam        Assessment & Plan:

## 2012-01-26 NOTE — Patient Instructions (Addendum)
Take the xyzal 5 mg  twice daily for the itching of the skin and continue on the Flomax And stay on the cholesterol medication  Do not drink liquids after 6 PM

## 2012-01-28 ENCOUNTER — Ambulatory Visit: Payer: Medicare Other | Admitting: Internal Medicine

## 2012-02-14 ENCOUNTER — Ambulatory Visit (INDEPENDENT_AMBULATORY_CARE_PROVIDER_SITE_OTHER): Payer: Medicare Other | Admitting: Internal Medicine

## 2012-02-14 ENCOUNTER — Encounter: Payer: Self-pay | Admitting: Internal Medicine

## 2012-02-14 VITALS — BP 140/80 | Temp 97.8°F | Wt 153.0 lb

## 2012-02-14 DIAGNOSIS — I10 Essential (primary) hypertension: Secondary | ICD-10-CM

## 2012-02-14 DIAGNOSIS — E538 Deficiency of other specified B group vitamins: Secondary | ICD-10-CM

## 2012-02-14 DIAGNOSIS — E1149 Type 2 diabetes mellitus with other diabetic neurological complication: Secondary | ICD-10-CM | POA: Diagnosis not present

## 2012-02-14 DIAGNOSIS — A5211 Tabes dorsalis: Secondary | ICD-10-CM

## 2012-02-14 DIAGNOSIS — E1142 Type 2 diabetes mellitus with diabetic polyneuropathy: Secondary | ICD-10-CM

## 2012-02-14 DIAGNOSIS — M1460 Charcot's joint, unspecified site: Secondary | ICD-10-CM

## 2012-02-14 LAB — BASIC METABOLIC PANEL
GFR: 54.06 mL/min — ABNORMAL LOW (ref >60.00)
Potassium: 4.3 mEq/L (ref 3.5–5.1)
Sodium: 140 mEq/L (ref 135–145)

## 2012-02-14 LAB — VITAMIN B12: Vitamin B-12: 316 pg/mL (ref 211–911)

## 2012-02-14 MED ORDER — BISOPROLOL-HYDROCHLOROTHIAZIDE 2.5-6.25 MG PO TABS: 1.0000 | ORAL_TABLET | Freq: Every day | ORAL | Status: DC

## 2012-02-14 NOTE — Progress Notes (Signed)
  Subjective:    Patient ID: Don Montoya, male    DOB: 08-Mar-1915, 78 y.o.   MRN: [id number]  HPI Patient is a 78 year old male who presents with new onset swelling in his feet and pain in the bottom of his feet.  His blood pressure has been stable at home.  Has a history of mild diabetes on DiaBeta Last fasting blood glucose was 125 he requires an A1c today.  Has a history of gout The edema in his legs appears to be new and may be related to an elevated heart rate   Review of Systems  Constitutional: Positive for fatigue. Negative for fever.  HENT: Negative for hearing loss, congestion, neck pain and postnasal drip.   Eyes: Negative for discharge, redness and visual disturbance.  Respiratory: Negative for cough, shortness of breath and wheezing.   Cardiovascular: Positive for palpitations and leg swelling.  Gastrointestinal: Negative for abdominal pain, constipation and abdominal distention.  Genitourinary: Negative for urgency and frequency.  Musculoskeletal: Negative for joint swelling and arthralgias.  Skin: Negative for color change and rash.  Neurological: Negative for weakness and light-headedness.  Hematological: Negative for adenopathy.  Psychiatric/Behavioral: Positive for decreased concentration. Negative for behavioral problems. The patient is nervous/anxious.        Objective:   Physical Exam  Nursing note and vitals reviewed. Constitutional: He appears well-developed and well-nourished.  HENT:  Head: Normocephalic and atraumatic.  Eyes: Conjunctivae are normal. Pupils are equal, round, and reactive to light.  Neck: Normal range of motion. Neck supple.  Cardiovascular: Normal rate.   Murmur heard.      Tachycardic  Pulmonary/Chest: Effort normal and breath sounds normal.  Abdominal: Soft. Bowel sounds are normal.  Musculoskeletal: He exhibits edema.  Skin: Skin is warm.       1+ lower extremity edema    He has decreased light touch and pinprick toes  lower extremity consistent with a neuropathy      Assessment & Plan:  Patient has a history of diabetes and hypertension blood pressure medicine was stopped a while back due to hypotension. His wife states his blood sugars have been reasonably well-controlled but an A1c will be drawn today he is on minimal DiaBeta he may be developing a diabetic peripheral neuropathy but the edema and the tachycardia suggests that he might have mild to moderate rate-related heart failure.  We will start him on bisoprolol 2.5 with a minimal amount of a diuretic and monitor his edema and blood pressure.  He continues on Plavix for a history of cerebral vascular disease And we'll measure a B12 level to see if he needs chronic B12 supplementation for propyl from peripheral neuropathy of other etiology

## 2012-03-16 ENCOUNTER — Telehealth: Payer: Self-pay | Admitting: Internal Medicine

## 2012-03-16 MED ORDER — CLOPIDOGREL BISULFATE 75 MG PO TABS: 75.0000 mg | ORAL_TABLET | Freq: Every day | ORAL | Status: DC

## 2012-03-16 NOTE — Telephone Encounter (Signed)
Pt wife called and said that pt needs refill of clopidogrel (PLAVIX) 75 MG tablet [facility] [address] [place], [place]. Pt has 5 pills left.

## 2012-03-16 NOTE — Telephone Encounter (Signed)
rx sent in electronically 

## 2012-03-29 ENCOUNTER — Other Ambulatory Visit: Payer: Self-pay | Admitting: Internal Medicine

## 2012-03-29 ENCOUNTER — Telehealth: Payer: Self-pay | Admitting: Internal Medicine

## 2012-03-29 DIAGNOSIS — R600 Localized edema: Secondary | ICD-10-CM

## 2012-03-29 DIAGNOSIS — R609 Edema, unspecified: Secondary | ICD-10-CM

## 2012-03-29 DIAGNOSIS — G629 Polyneuropathy, unspecified: Secondary | ICD-10-CM

## 2012-03-29 NOTE — Telephone Encounter (Signed)
Order EMG and NCV of LE

## 2012-03-29 NOTE — Telephone Encounter (Signed)
EMG AND NCV of LE ordered.

## 2012-03-29 NOTE — Telephone Encounter (Signed)
Wife, Britta Mccreedy calling.  Wants a referral to see someone for the neuropathy in his feet.  Having increased pedal edema.  His blood sugar was checked a week ago and cannot give the reading.   Asking for a referral only.

## 2012-03-29 NOTE — Telephone Encounter (Signed)
Called pt's wife to make aware that Dr. Lovell Sheehan is out of the office until Monday.  Pt's wife states pt would like a referral asap.

## 2012-04-06 DIAGNOSIS — E1142 Type 2 diabetes mellitus with diabetic polyneuropathy: Secondary | ICD-10-CM | POA: Diagnosis not present

## 2012-04-17 ENCOUNTER — Telehealth: Payer: Self-pay | Admitting: Internal Medicine

## 2012-04-17 NOTE — Telephone Encounter (Signed)
Have yo seen these results?

## 2012-04-17 NOTE — Telephone Encounter (Signed)
Pts spouse called and wanted to know if Dr Lovell Sheehan a copy of some test re: pts legs from [facility]? Pls call with results and to let pt know that test info was rcvd.

## 2012-04-17 NOTE — Telephone Encounter (Signed)
There is nothing in the medical record showing any correspondence from [facility] and less Kendal Hymen has on her desk. Please review to see if there are office has received any correspondence if we have not notify [facility] the patient is calling for [place] to see the results

## 2012-04-18 NOTE — Telephone Encounter (Signed)
It has been scanned in under procedures-I will let wife know you will advise him on it when you return thursday

## 2012-04-19 NOTE — Telephone Encounter (Signed)
Pts wife informed.

## 2012-04-19 NOTE — Telephone Encounter (Signed)
Tell patient's wife that I reviewed the EMG and nerve conduction velocity and there is no conclusion for me to make from it she would have to call Dr. Anne Hahn his office to ask them what the conclusion is the EMG does not conclude whether or not he has a peripheral neuropathy or this represents a radiculopathy it simply lists the results.

## 2012-04-28 ENCOUNTER — Ambulatory Visit (INDEPENDENT_AMBULATORY_CARE_PROVIDER_SITE_OTHER): Payer: Medicare Other | Admitting: Internal Medicine

## 2012-04-28 ENCOUNTER — Encounter: Payer: Self-pay | Admitting: Internal Medicine

## 2012-04-28 VITALS — BP 130/80 | HR 72 | Temp 98.2°F | Resp 16 | Ht 66.0 in | Wt 158.0 lb

## 2012-04-28 DIAGNOSIS — G609 Hereditary and idiopathic neuropathy, unspecified: Secondary | ICD-10-CM

## 2012-04-28 DIAGNOSIS — G629 Polyneuropathy, unspecified: Secondary | ICD-10-CM

## 2012-04-28 DIAGNOSIS — Z23 Encounter for immunization: Secondary | ICD-10-CM | POA: Diagnosis not present

## 2012-04-28 MED ORDER — GABAPENTIN ENACARBIL ER 600 MG PO TBCR: 1.0000 | EXTENDED_RELEASE_TABLET | Freq: Every evening | ORAL | Status: DC | PRN

## 2012-04-28 NOTE — Patient Instructions (Signed)
.  avs  The patient is instructed to continue all medications as prescribed. Schedule followup with check out clerk upon leaving the clinic  

## 2012-04-28 NOTE — Progress Notes (Signed)
  Subjective:    Patient ID: Don Montoya, male    DOB: 1915-01-14, 78 y.o.   MRN: [id number]  HPI DM with severe neuropathy EMG proven Pt has ankle swelling and worsening "pain"     Review of Systems  Constitutional: Negative for fever and fatigue.  HENT: Negative for hearing loss, congestion, neck pain and postnasal drip.   Eyes: Negative for discharge, redness and visual disturbance.  Respiratory: Negative for cough, shortness of breath and wheezing.   Cardiovascular: Negative for leg swelling.  Gastrointestinal: Negative for abdominal pain, constipation and abdominal distention.  Genitourinary: Negative for urgency and frequency.  Musculoskeletal: Negative for joint swelling and arthralgias.  Skin: Negative for color change and rash.  Neurological: Positive for numbness. Negative for light-headedness.       Lower extremity peripheral neuropathy  Hematological: Negative for adenopathy.  Psychiatric/Behavioral: Positive for behavioral problems and dysphoric mood. The patient is nervous/anxious.        Objective:   Physical Exam  Nursing note and vitals reviewed. Constitutional: He appears well-developed and well-nourished.  HENT:  Head: Normocephalic and atraumatic.  Eyes: Conjunctivae normal are normal. Pupils are equal, round, and reactive to light.  Neck: Normal range of motion. Neck supple.  Cardiovascular: Normal rate and regular rhythm.   Pulmonary/Chest: Effort normal and breath sounds normal.  Abdominal: Soft. Bowel sounds are normal.          Assessment & Plan:  Will try gabapentin 600 mg extended release at bedtime for the peripheral neuropathy

## 2012-05-04 DIAGNOSIS — H40059 Ocular hypertension, unspecified eye: Secondary | ICD-10-CM | POA: Diagnosis not present

## 2012-07-06 ENCOUNTER — Other Ambulatory Visit: Payer: Self-pay | Admitting: Internal Medicine

## 2012-07-06 MED ORDER — CLOPIDOGREL BISULFATE 75 MG PO TABS: 75.0000 mg | ORAL_TABLET | Freq: Every day | ORAL | Status: DC

## 2012-07-06 NOTE — Telephone Encounter (Signed)
Pt needs refill of Clopidogrel 75 mg called into [place], [address] [place]  90 day refill Pt is out of meds.

## 2012-07-06 NOTE — Telephone Encounter (Signed)
rx sent in electronically 

## 2012-07-13 DIAGNOSIS — H353190 Nonexudative age-related macular degeneration, unspecified eye, stage unspecified: Secondary | ICD-10-CM | POA: Diagnosis not present

## 2012-07-13 DIAGNOSIS — H35369 Drusen (degenerative) of macula, unspecified eye: Secondary | ICD-10-CM | POA: Diagnosis not present

## 2012-07-13 DIAGNOSIS — H35049 Retinal micro-aneurysms, unspecified, unspecified eye: Secondary | ICD-10-CM | POA: Diagnosis not present

## 2012-07-13 DIAGNOSIS — H43819 Vitreous degeneration, unspecified eye: Secondary | ICD-10-CM | POA: Diagnosis not present

## 2012-07-17 ENCOUNTER — Ambulatory Visit: Payer: Medicare Other | Admitting: Internal Medicine

## 2012-07-25 ENCOUNTER — Encounter: Payer: Self-pay | Admitting: Internal Medicine

## 2012-07-25 ENCOUNTER — Ambulatory Visit (INDEPENDENT_AMBULATORY_CARE_PROVIDER_SITE_OTHER): Payer: Medicare Other | Admitting: Internal Medicine

## 2012-07-25 VITALS — BP 146/80 | HR 80 | Temp 98.2°F | Resp 16 | Ht 66.0 in | Wt 155.0 lb

## 2012-07-25 DIAGNOSIS — I10 Essential (primary) hypertension: Secondary | ICD-10-CM

## 2012-07-25 DIAGNOSIS — N400 Enlarged prostate without lower urinary tract symptoms: Secondary | ICD-10-CM

## 2012-07-25 DIAGNOSIS — R350 Frequency of micturition: Secondary | ICD-10-CM

## 2012-07-25 DIAGNOSIS — IMO0001 Reserved for inherently not codable concepts without codable children: Secondary | ICD-10-CM

## 2012-07-25 LAB — BASIC METABOLIC PANEL
BUN: 23 mg/dL (ref 6–23)
Chloride: 108 mEq/L (ref 96–112)
Creatinine, Ser: 1.5 mg/dL (ref 0.4–1.5)
Glucose, Bld: 93 mg/dL (ref 70–99)
Potassium: 4.4 mEq/L (ref 3.5–5.1)

## 2012-07-25 MED ORDER — TAMSULOSIN HCL 0.4 MG PO CAPS: 0.8000 mg | ORAL_CAPSULE | Freq: Every day | ORAL | Status: DC

## 2012-07-25 NOTE — Patient Instructions (Signed)
Take 2 Flomax capsules at dinnertime each day

## 2012-07-25 NOTE — Progress Notes (Signed)
Subjective:    Patient ID: Don Montoya, male    DOB: 08/10/1914, 78 y.o.   MRN: [id number]  HPI  Has BPH with increased nocturia Blood pressure stable [facility] the [facility] stable   Review of Systems  Constitutional: Negative for fever and fatigue.  HENT: Negative for hearing loss, congestion, neck pain and postnasal drip.   Eyes: Negative for discharge, redness and visual disturbance.  Respiratory: Negative for cough, shortness of breath and wheezing.   Cardiovascular: Negative for leg swelling.  Gastrointestinal: Negative for abdominal pain, constipation and abdominal distention.  Genitourinary: Negative for urgency and frequency.  Musculoskeletal: Negative for joint swelling and arthralgias.  Skin: Negative for color change and rash.  Neurological: Negative for weakness and light-headedness.  Hematological: Negative for adenopathy.  Psychiatric/Behavioral: Negative for behavioral problems.   Past Medical History  Diagnosis Date  . Diabetes mellitus   . Diverticulosis   . Hypertension   . PVD (peripheral vascular disease)   . Renal insufficiency   . Stroke   . Hyperlipidemia   . GERD (gastroesophageal reflux disease)   . Constipation     History   Social History  . Marital Status: Married    Spouse Name: N/A    Number of Children: N/A  . Years of Education: N/A   Occupational History  . Retired    Social History Main Topics  . Smoking status: Never Smoker   . Smokeless tobacco: Never Used  . Alcohol Use: No  . Drug Use: No  . Sexually Active: Not Currently   Other Topics Concern  . Not on file   Social History Narrative   Married Independent    Past Surgical History  Procedure Date  . Kidney stone surgery   . Vertebral artery stent   . Hernia repair     right inguinal  . Left groin exploration     Family History  Problem Relation Age of Onset  . Diabetes Mother   . Heart disease Mother   . Colon cancer Neg Hx     Allergies  Allergen  Reactions  . Prednisone     psycosis    Current Outpatient Prescriptions on File Prior to Visit  Medication Sig Dispense Refill  . aspirin 81 MG tablet Take 81 mg by mouth daily.        . bisoprolol-hydrochlorothiazide (ZIAC) 2.5-6.25 MG per tablet Take 1 tablet by mouth daily.  30 tablet  11  . clopidogrel (PLAVIX) 75 MG tablet Take 1 tablet (75 mg total) by mouth daily.  90 tablet  3  . Gabapentin Enacarbil (HORIZANT) 600 MG TB24 Take 1 tablet by mouth at bedtime and may repeat dose one time if needed.  30 tablet  0  . glyBURIDE (DIABETA) 2.5 MG tablet Take 2.5 mg by mouth daily with breakfast.        . levocetirizine (XYZAL) 5 MG tablet Take 1 tablet (5 mg total) by mouth 2 (two) times daily.  60 tablet  11  . Multiple Vitamins-Minerals (ICAPS MV PO) Take by mouth 2 (two) times daily.        . simvastatin (ZOCOR) 80 MG tablet Take 1 tablet (80 mg total) by mouth at bedtime.  30 tablet  3  . Tamsulosin HCl (FLOMAX) 0.4 MG CAPS Take 1 capsule (0.4 mg total) by mouth daily.  30 capsule  3    BP 146/80  Pulse 80  Temp 98.2 F (36.8 C)  Resp 16  Ht 5\' 6"  (1.676  m)  Wt 155 lb (70.308 kg)  BMI 25.02 kg/m2       Objective:   Physical Exam  Nursing note and vitals reviewed. Constitutional: He appears well-developed and well-nourished.  HENT:  Head: Normocephalic and atraumatic.  Eyes: Conjunctivae normal are normal. Pupils are equal, round, and reactive to light.  Neck: Normal range of motion. Neck supple.  Cardiovascular: Normal rate and regular rhythm.   Pulmonary/Chest: Effort normal and breath sounds normal.  Abdominal: Soft. Bowel sounds are normal.          Assessment & Plan:  monitor a1c Repeat urine culture for frequency and dysuria Bmet for hx of renal insufficiency and HTN

## 2012-08-21 DIAGNOSIS — L821 Other seborrheic keratosis: Secondary | ICD-10-CM | POA: Diagnosis not present

## 2012-08-21 DIAGNOSIS — L219 Seborrheic dermatitis, unspecified: Secondary | ICD-10-CM | POA: Diagnosis not present

## 2012-10-12 DIAGNOSIS — R82998 Other abnormal findings in urine: Secondary | ICD-10-CM | POA: Diagnosis not present

## 2012-10-30 DIAGNOSIS — K5900 Constipation, unspecified: Secondary | ICD-10-CM | POA: Diagnosis not present

## 2012-10-30 DIAGNOSIS — R142 Eructation: Secondary | ICD-10-CM | POA: Diagnosis not present

## 2012-10-30 DIAGNOSIS — R143 Flatulence: Secondary | ICD-10-CM | POA: Diagnosis not present

## 2012-11-06 DIAGNOSIS — K5900 Constipation, unspecified: Secondary | ICD-10-CM | POA: Diagnosis not present

## 2012-11-07 DIAGNOSIS — N390 Urinary tract infection, site not specified: Secondary | ICD-10-CM | POA: Diagnosis not present

## 2012-11-08 DIAGNOSIS — K219 Gastro-esophageal reflux disease without esophagitis: Secondary | ICD-10-CM | POA: Diagnosis not present

## 2012-11-08 DIAGNOSIS — R1084 Generalized abdominal pain: Secondary | ICD-10-CM | POA: Diagnosis not present

## 2012-11-08 DIAGNOSIS — K5900 Constipation, unspecified: Secondary | ICD-10-CM | POA: Diagnosis not present

## 2012-11-09 DIAGNOSIS — Z961 Presence of intraocular lens: Secondary | ICD-10-CM | POA: Diagnosis not present

## 2012-11-09 DIAGNOSIS — E119 Type 2 diabetes mellitus without complications: Secondary | ICD-10-CM | POA: Diagnosis not present

## 2012-11-09 DIAGNOSIS — H40059 Ocular hypertension, unspecified eye: Secondary | ICD-10-CM | POA: Diagnosis not present

## 2012-11-09 DIAGNOSIS — H3530 Unspecified macular degeneration: Secondary | ICD-10-CM | POA: Diagnosis not present

## 2012-11-22 ENCOUNTER — Ambulatory Visit: Payer: Medicare Other | Admitting: Internal Medicine

## 2012-11-22 DIAGNOSIS — R1084 Generalized abdominal pain: Secondary | ICD-10-CM | POA: Diagnosis not present

## 2012-11-23 ENCOUNTER — Encounter: Payer: Self-pay | Admitting: Internal Medicine

## 2012-11-23 ENCOUNTER — Ambulatory Visit (INDEPENDENT_AMBULATORY_CARE_PROVIDER_SITE_OTHER): Payer: Medicare Other | Admitting: Internal Medicine

## 2012-11-23 VITALS — BP 140/80 | HR 84 | Temp 98.2°F | Resp 18 | Ht 66.0 in | Wt 156.0 lb

## 2012-11-23 DIAGNOSIS — L299 Pruritus, unspecified: Secondary | ICD-10-CM | POA: Diagnosis not present

## 2012-11-23 DIAGNOSIS — T887XXA Unspecified adverse effect of drug or medicament, initial encounter: Secondary | ICD-10-CM

## 2012-11-23 DIAGNOSIS — E1149 Type 2 diabetes mellitus with other diabetic neurological complication: Secondary | ICD-10-CM | POA: Diagnosis not present

## 2012-11-23 DIAGNOSIS — Z23 Encounter for immunization: Secondary | ICD-10-CM | POA: Diagnosis not present

## 2012-11-23 DIAGNOSIS — I10 Essential (primary) hypertension: Secondary | ICD-10-CM | POA: Diagnosis not present

## 2012-11-23 LAB — BASIC METABOLIC PANEL
BUN: 15 mg/dL (ref 6–23)
Calcium: 9.3 mg/dL (ref 8.4–10.5)
GFR: 57.5 mL/min — ABNORMAL LOW (ref >60.00)
Glucose, Bld: 90 mg/dL (ref 70–99)

## 2012-11-23 LAB — CBC WITH DIFFERENTIAL/PLATELET
Basophils Relative: 0.6 % (ref 0.0–3.0)
Eosinophils Absolute: 0.2 10*3/uL (ref 0.0–0.7)
Lymphocytes Relative: 30.8 % (ref 12.0–46.0)
Lymphs Abs: 3.2 10*3/uL (ref 0.7–4.0)
Monocytes Absolute: 0.8 10*3/uL (ref 0.1–1.0)
Monocytes Relative: 7.4 % (ref 3.0–12.0)
Neutro Abs: 6.2 10*3/uL (ref 1.4–7.7)
Neutrophils Relative %: 59.4 % (ref 43.0–77.0)
Platelets: 177 10*3/uL (ref 150.0–400.0)
RDW: 13.6 % (ref 11.5–14.6)

## 2012-11-23 MED ORDER — SIMVASTATIN 80 MG PO TABS: 80.0000 mg | ORAL_TABLET | Freq: Every day | ORAL | Status: DC

## 2012-11-23 MED ORDER — BISOPROLOL-HYDROCHLOROTHIAZIDE 2.5-6.25 MG PO TABS: 1.0000 | ORAL_TABLET | Freq: Every day | ORAL | Status: DC

## 2012-11-23 MED ORDER — LEVOCETIRIZINE DIHYDROCHLORIDE 5 MG PO TABS: 5.0000 mg | ORAL_TABLET | Freq: Two times a day (BID) | ORAL | Status: DC

## 2012-11-23 MED ORDER — GABAPENTIN ENACARBIL ER 600 MG PO TBCR: 1.0000 | EXTENDED_RELEASE_TABLET | Freq: Every evening | ORAL | Status: DC | PRN

## 2012-11-23 NOTE — Addendum Note (Signed)
Addended by: Willy Eddy on: 11/23/2012 01:17 PM   Modules accepted: Orders

## 2012-11-23 NOTE — Progress Notes (Signed)
Subjective:    Patient ID: Don Montoya, male    DOB: 09/13/1914, 79 y.o.   MRN: [id number]  HPI  Monitoring of lipids, diabetes and  Renal function CBC while on plavix Hearing loss due to drainage and ear fullness Mild wheezing from URI  Review of Systems  Constitutional: Negative for fever and fatigue.  HENT: Positive for congestion, rhinorrhea and postnasal drip. Negative for hearing loss and neck pain.   Eyes: Negative for discharge, redness and visual disturbance.  Respiratory: Positive for wheezing. Negative for cough and shortness of breath.   Cardiovascular: Negative for leg swelling.  Gastrointestinal: Negative for abdominal pain, constipation and abdominal distention.  Genitourinary: Negative for urgency and frequency.  Musculoskeletal: Negative for joint swelling and arthralgias.  Skin: Negative for color change and rash.  Neurological: Positive for weakness. Negative for light-headedness.  Hematological: Negative for adenopathy.  Psychiatric/Behavioral: Negative for behavioral problems.   Past Medical History  Diagnosis Date  . Diabetes mellitus   . Diverticulosis   . Hypertension   . PVD (peripheral vascular disease)   . Renal insufficiency   . Stroke   . Hyperlipidemia   . GERD (gastroesophageal reflux disease)   . Constipation     History   Social History  . Marital Status: Married    Spouse Name: N/A    Number of Children: N/A  . Years of Education: N/A   Occupational History  . Retired    Social History Main Topics  . Smoking status: Never Smoker   . Smokeless tobacco: Never Used  . Alcohol Use: No  . Drug Use: No  . Sexually Active: Not Currently   Other Topics Concern  . Not on file   Social History Narrative   Married    Independent          Past Surgical History  Procedure Laterality Date  . Kidney stone surgery    . Vertebral artery stent    . Hernia repair      right inguinal  . Left groin exploration      Family  History  Problem Relation Age of Onset  . Diabetes Mother   . Heart disease Mother   . Colon cancer Neg Hx     Allergies  Allergen Reactions  . Prednisone     psycosis    Current Outpatient Prescriptions on File Prior to Visit  Medication Sig Dispense Refill  . aspirin 81 MG tablet Take 81 mg by mouth daily.        . clopidogrel (PLAVIX) 75 MG tablet Take 1 tablet (75 mg total) by mouth daily.  90 tablet  3  . glyBURIDE (DIABETA) 2.5 MG tablet Take 2.5 mg by mouth daily with breakfast.        . Multiple Vitamins-Minerals (ICAPS MV PO) Take by mouth 2 (two) times daily.        . Tamsulosin HCl (FLOMAX) 0.4 MG CAPS Take 2 capsules (0.8 mg total) by mouth daily.  60 capsule  11   No current facility-administered medications on file prior to visit.    BP 140/80  Pulse 84  Temp(Src) 98.2 F (36.8 C)  Resp 18  Ht 5\' 6"  (1.676 m)  Wt 156 lb (70.761 kg)  BMI 25.19 kg/m2       Objective:   Physical Exam  Nursing note and vitals reviewed. Constitutional: He appears well-developed and well-nourished.  HENT:  Head: Normocephalic and atraumatic.  Eyes: Conjunctivae are normal. Pupils are equal, round, and  reactive to light.  Swollen turbinates  Neck: Normal range of motion. Neck supple.  Cardiovascular: Normal rate and regular rhythm.   Pulmonary/Chest: Effort normal and breath sounds normal.  Abdominal: Soft. Bowel sounds are normal.          Assessment & Plan:  allergies and URI with wheezing ( upper)  Lung fields are clear Add claritin Stable HTN , monotoring for lipids, liver and bmet

## 2012-11-29 DIAGNOSIS — N1330 Unspecified hydronephrosis: Secondary | ICD-10-CM | POA: Diagnosis not present

## 2012-11-29 DIAGNOSIS — K5900 Constipation, unspecified: Secondary | ICD-10-CM | POA: Diagnosis not present

## 2012-12-07 DIAGNOSIS — N1330 Unspecified hydronephrosis: Secondary | ICD-10-CM | POA: Diagnosis not present

## 2012-12-20 DIAGNOSIS — K5900 Constipation, unspecified: Secondary | ICD-10-CM | POA: Diagnosis not present

## 2012-12-20 DIAGNOSIS — R1084 Generalized abdominal pain: Secondary | ICD-10-CM | POA: Diagnosis not present

## 2012-12-28 DIAGNOSIS — K5730 Diverticulosis of large intestine without perforation or abscess without bleeding: Secondary | ICD-10-CM | POA: Diagnosis not present

## 2012-12-28 DIAGNOSIS — R1084 Generalized abdominal pain: Secondary | ICD-10-CM | POA: Diagnosis not present

## 2013-01-08 ENCOUNTER — Ambulatory Visit (HOSPITAL_COMMUNITY)
Admission: AD | Admit: 2013-01-08 | Discharge: 2013-01-08 | Disposition: A | Discharge status: Home / Self Care | Payer: Medicare Other | Source: Ambulatory Visit | Attending: Gastroenterology | Admitting: Gastroenterology

## 2013-01-08 ENCOUNTER — Encounter (HOSPITAL_COMMUNITY)
Admission: AD | Disposition: A | Discharge status: Home / Self Care | Payer: Self-pay | Source: Ambulatory Visit | Attending: Gastroenterology

## 2013-01-08 ENCOUNTER — Emergency Department (HOSPITAL_BASED_OUTPATIENT_CLINIC_OR_DEPARTMENT_OTHER)
Admission: EM | Admit: 2013-01-08 | Discharge: 2013-01-08 | Disposition: A | Discharge status: Home / Self Care | Payer: Medicare Other | Source: Home / Self Care | Attending: Emergency Medicine | Admitting: Emergency Medicine

## 2013-01-08 ENCOUNTER — Other Ambulatory Visit: Payer: Self-pay | Admitting: Gastroenterology

## 2013-01-08 ENCOUNTER — Encounter (HOSPITAL_BASED_OUTPATIENT_CLINIC_OR_DEPARTMENT_OTHER): Payer: Self-pay | Admitting: *Deleted

## 2013-01-08 ENCOUNTER — Encounter (HOSPITAL_COMMUNITY): Payer: Self-pay | Admitting: *Deleted

## 2013-01-08 DIAGNOSIS — Z8719 Personal history of other diseases of the digestive system: Secondary | ICD-10-CM | POA: Insufficient documentation

## 2013-01-08 DIAGNOSIS — E785 Hyperlipidemia, unspecified: Secondary | ICD-10-CM | POA: Insufficient documentation

## 2013-01-08 DIAGNOSIS — R1319 Other dysphagia: Secondary | ICD-10-CM | POA: Diagnosis not present

## 2013-01-08 DIAGNOSIS — Z862 Personal history of diseases of the blood and blood-forming organs and certain disorders involving the immune mechanism: Secondary | ICD-10-CM | POA: Insufficient documentation

## 2013-01-08 DIAGNOSIS — T18128A Food in esophagus causing other injury, initial encounter: Secondary | ICD-10-CM

## 2013-01-08 DIAGNOSIS — K222 Esophageal obstruction: Secondary | ICD-10-CM

## 2013-01-08 DIAGNOSIS — I10 Essential (primary) hypertension: Secondary | ICD-10-CM | POA: Insufficient documentation

## 2013-01-08 DIAGNOSIS — R066 Hiccough: Secondary | ICD-10-CM | POA: Diagnosis not present

## 2013-01-08 DIAGNOSIS — K449 Diaphragmatic hernia without obstruction or gangrene: Secondary | ICD-10-CM | POA: Diagnosis not present

## 2013-01-08 DIAGNOSIS — K219 Gastro-esophageal reflux disease without esophagitis: Secondary | ICD-10-CM | POA: Diagnosis not present

## 2013-01-08 DIAGNOSIS — Z8673 Personal history of transient ischemic attack (TIA), and cerebral infarction without residual deficits: Secondary | ICD-10-CM | POA: Insufficient documentation

## 2013-01-08 DIAGNOSIS — Z87448 Personal history of other diseases of urinary system: Secondary | ICD-10-CM | POA: Insufficient documentation

## 2013-01-08 DIAGNOSIS — T18108A Unspecified foreign body in esophagus causing other injury, initial encounter: Secondary | ICD-10-CM

## 2013-01-08 DIAGNOSIS — Z8639 Personal history of other endocrine, nutritional and metabolic disease: Secondary | ICD-10-CM | POA: Insufficient documentation

## 2013-01-08 DIAGNOSIS — Z8679 Personal history of other diseases of the circulatory system: Secondary | ICD-10-CM | POA: Insufficient documentation

## 2013-01-08 DIAGNOSIS — E119 Type 2 diabetes mellitus without complications: Secondary | ICD-10-CM | POA: Insufficient documentation

## 2013-01-08 DIAGNOSIS — K228 Other specified diseases of esophagus: Secondary | ICD-10-CM | POA: Diagnosis not present

## 2013-01-08 DIAGNOSIS — IMO0002 Reserved for concepts with insufficient information to code with codable children: Secondary | ICD-10-CM | POA: Insufficient documentation

## 2013-01-08 HISTORY — PX: ESOPHAGOGASTRODUODENOSCOPY: SHX5428

## 2013-01-08 LAB — BASIC METABOLIC PANEL
BUN: 24 mg/dL — ABNORMAL HIGH (ref 6–23)
Calcium: 10.7 mg/dL — ABNORMAL HIGH (ref 8.4–10.5)
GFR calc Af Amer: 44 mL/min — ABNORMAL LOW (ref >90)
GFR calc non Af Amer: 38 mL/min — ABNORMAL LOW (ref >90)
Glucose, Bld: 100 mg/dL — ABNORMAL HIGH (ref 70–99)
Potassium: 4.2 mEq/L (ref 3.5–5.1)
Sodium: 143 mEq/L (ref 135–145)

## 2013-01-08 LAB — CBC WITH DIFFERENTIAL/PLATELET
Basophils Relative: 1 % (ref 0–1)
Eosinophils Absolute: 0.1 10*3/uL (ref 0.0–0.7)
Eosinophils Relative: 1 % (ref 0–5)
Lymphs Abs: 3.4 10*3/uL (ref 0.7–4.0)
MCH: 29.7 pg (ref 26.0–34.0)
MCHC: 34.4 g/dL (ref 30.0–36.0)
MCV: 86.3 fL (ref 78.0–100.0)
Monocytes Relative: 7 % (ref 3–12)
Neutrophils Relative %: 65 % (ref 43–77)
Platelets: 185 10*3/uL (ref 150–400)

## 2013-01-08 SURGERY — EGD (ESOPHAGOGASTRODUODENOSCOPY)
Anesthesia: Moderate Sedation

## 2013-01-08 MED ORDER — FENTANYL CITRATE 0.05 MG/ML IJ SOLN
INTRAMUSCULAR | Status: DC | PRN
  Administered 2013-01-08 (×2): 25 ug via INTRAVENOUS

## 2013-01-08 MED ORDER — MIDAZOLAM HCL 10 MG/2ML IJ SOLN
INTRAMUSCULAR | Status: AC
  Filled 2013-01-08: qty 2

## 2013-01-08 MED ORDER — FENTANYL CITRATE 0.05 MG/ML IJ SOLN
INTRAMUSCULAR | Status: AC
  Filled 2013-01-08: qty 2

## 2013-01-08 MED ORDER — BUTAMBEN-TETRACAINE-BENZOCAINE 2-2-14 % EX AERO
INHALATION_SPRAY | CUTANEOUS | Status: DC | PRN
  Administered 2013-01-08: 2 via TOPICAL

## 2013-01-08 MED ORDER — MIDAZOLAM HCL 10 MG/2ML IJ SOLN
INTRAMUSCULAR | Status: DC | PRN
  Administered 2013-01-08 (×2): 2 mg via INTRAVENOUS

## 2013-01-08 NOTE — ED Notes (Signed)
Patient states that after eating breakfast he feels as if something is tuck in his throat, if he tries to drink it comes back up, currently has hiccups

## 2013-01-08 NOTE — ED Provider Notes (Signed)
History     CSN: [id number]  Arrival date & time 01/08/13  1236   First MD Initiated Contact with Patient 01/08/13 1307      Chief Complaint  Patient presents with  . Dysphagia     HPI Patient states that after eating breakfast he feels as if something is tuck in his throat, if he tries to drink it comes back up, currently has hiccups.  This started after he had breakfast this morning.  Patient has had problems with esophageal strictures in the past and has had a half dilatation this.  Last esophageal dilatation was 3-4 years ago.  Patient feels like he is unable to swallow his own saliva.  Past Medical History  Diagnosis Date  . Diabetes mellitus   . Diverticulosis   . Hypertension   . PVD (peripheral vascular disease)   . Renal insufficiency   . Stroke   . Hyperlipidemia   . GERD (gastroesophageal reflux disease)   . Constipation     Past Surgical History  Procedure Laterality Date  . Kidney stone surgery    . Vertebral artery stent    . Hernia repair      right inguinal  . Left groin exploration      Family History  Problem Relation Age of Onset  . Diabetes Mother   . Heart disease Mother   . Colon cancer Neg Hx     History  Substance Use Topics  . Smoking status: Never Smoker   . Smokeless tobacco: Never Used  . Alcohol Use: No      Review of Systems All other systems reviewed and are negative Allergies  Prednisone  Home Medications  No current outpatient prescriptions on file.  BP 178/71  Pulse 90  Temp(Src) 98.2 F (36.8 C) (Oral)  Ht 5\' 7"  (1.702 m)  Wt 154 lb (69.854 kg)  BMI 24.11 kg/m2  SpO2 100%  Physical Exam  Nursing note and vitals reviewed. Constitutional: He is oriented to person, place, and time. He appears well-developed and well-nourished. He appears distressed (Patient appears anxious because of inability to swallow).  HENT:  Head: Normocephalic and atraumatic.  Eyes: Pupils are equal, round, and reactive to light.   Neck: Normal range of motion.  Cardiovascular: Normal rate and intact distal pulses.   Pulmonary/Chest: No respiratory distress.  Abdominal: Normal appearance. He exhibits no distension. There is no tenderness. There is no rebound.  Musculoskeletal: Normal range of motion.  Neurological: He is alert and oriented to person, place, and time. No cranial nerve deficit.  Skin: Skin is warm and dry. No rash noted.  Psychiatric: He has a normal mood and affect. His behavior is normal.    ED Course  Procedures (including critical care time) Patient was given water to drink and was unable swallow it.  Patient was unable swallow his own saliva.  Patient also noted to have hit cuts. Labs Reviewed  CBC WITH DIFFERENTIAL - Abnormal; Notable for the following:    WBC 13.0 (*)    Neutro Abs 8.4 (*)    All other components within normal limits  BASIC METABOLIC PANEL - Abnormal; Notable for the following:    Glucose, Bld 100 (*)    BUN 24 (*)    Creatinine, Ser 1.50 (*)    Calcium 10.7 (*)    GFR calc non Af Amer 38 (*)    GFR calc Af Amer 44 (*)    All other components within normal limits   No results  found.   1. Esophageal obstruction       MDM  Contacted GI who requested patient come to the endoscopy suite at [facility].  Patient given option for transfer her by [facility] but requested to take [facility]. vehicle.  IV Hep-Lock established.  Patient stable at time of discharge.       Nelia Shi, MD 01/08/13 [phone]

## 2013-01-08 NOTE — Op Note (Signed)
[facility] [address] [place], [address]   ENDOSCOPY PROCEDURE REPORT  PATIENT: Don Montoya, Don Montoya  MR#: [id number] BIRTHDATE: 10/21/1914 , 89  yrs. old GENDER: Male ENDOSCOPIST: Meryl Dare, MD, [facility] PROCEDURE DATE:  01/08/2013 PROCEDURE:  EGD w/ fb removal ASA CLASS:     Class III INDICATIONS:  Dysphagia, progressive for months and now acute folllowing meal with grits for breakfast today MEDICATIONS: medications were titrated to patient response per physician's verbal order, Fentanyl 50 mcg IV, and Versed 4 mg IV TOPICAL ANESTHETIC: Cetacaine Spray DESCRIPTION OF PROCEDURE: After the risks benefits and alternatives of the procedure were thoroughly explained, informed consent was obtained.  The    endoscope was introduced through the mouth and advanced to the second portion of the duodenum  without limitations.  The instrument was slowly withdrawn as the mucosa was fully examined.  ESOPHAGUS: Food impaction in distal esophagus was cleared into the stomach. Proximal and mid esophagus appeared mildly dilated. Stricture, benign appearing, was found at the gastroesophageal junction.  The stenosis was traversable with the endoscope.   The esophagus was otherwise normal. STOMACH: The mucosa folds of the stomach appeared normal. DUODENUM: The duodenal mucosa showed no abnormalities in the bulb and second portion of the duodenum.  Retroflexed views revealed a small hiatal hernia.  The scope was then withdrawn from the patient and the procedure completed.  COMPLICATIONS: There were no complications.  ENDOSCOPIC IMPRESSION: 1.   Stricture at the gastroesophageal junction 2.    Esopahgeal food impaction was cleared 3.   Small hiatal hernia  RECOMMENDATIONS: 1.  Continue PPI daily and a soft diet 2.  Endoscopy with dilation per Dr. Christella Hartigan   eSigned:  Meryl Dare, MD, [facility] 01/08/2013 3:54 PM   XB:[id number] Christella Hartigan, MD

## 2013-01-08 NOTE — H&P (Signed)
CSN: [id number]  Arrival date & time 01/08/13 1236  First MD Initiated Contact with Patient 01/08/13 1307  Chief Complaint   Patient presents with   .  Dysphagia    HPI  Patient states that after eating breakfast he feels as if something is tuck in his throat, if he tries to drink it comes back up, currently has hiccups. This started after he had breakfast this morning. Patient has had problems with esophageal strictures in the past and has had a half dilatation this. Last esophageal dilatation was 3-4 years ago. Patient feels like he is unable to swallow his own saliva.  Past Medical History   Diagnosis  Date   .  Diabetes mellitus    .  Diverticulosis    .  Hypertension    .  PVD (peripheral vascular disease)    .  Renal insufficiency    .  Stroke    .  Hyperlipidemia    .  GERD (gastroesophageal reflux disease)    .  Constipation     Past Surgical History   Procedure  Laterality  Date   .  Kidney stone surgery     .  Vertebral artery stent     .  Hernia repair       right inguinal   .  Left groin exploration      Family History   Problem  Relation  Age of Onset   .  Diabetes  Mother    .  Heart disease  Mother    .  Colon cancer  Neg Hx     History   Substance Use Topics   .  Smoking status:  Never Smoker   .  Smokeless tobacco:  Never Used   .  Alcohol Use:  No     Review of Systems  All other systems reviewed and are negative  Allergies   Prednisone  Home Medications   No current outpatient prescriptions on file.  BP 178/71  Pulse 90  Temp(Src) 98.2 F (36.8 C) (Oral)  Ht 5\' 7"  (1.702 m)  Wt 154 lb (69.854 kg)  BMI 24.11 kg/m2  SpO2 100%  Physical Exam  Nursing note and vitals reviewed.  Constitutional: He is oriented to person, place, and time. He appears well-developed and well-nourished. He appears distressed (Patient appears anxious because of inability to swallow).  HENT:  Head: Normocephalic and atraumatic.  Eyes: Pupils are equal, round, and  reactive to light.  Neck: Normal range of motion.  Cardiovascular: Normal rate and intact distal pulses.  Pulmonary/Chest: No respiratory distress.  Abdominal: Normal appearance. He exhibits no distension. There is no tenderness. There is no rebound.  Musculoskeletal: Normal range of motion.  Neurological: He is alert and oriented to person, place, and time. No cranial nerve deficit.  Skin: Skin is warm and dry. No rash noted.  Psychiatric: He has a normal mood and affect. His behavior is normal.   ED Course   Procedures (including critical care time)  Patient was given water to drink and was unable swallow it. Patient was unable swallow his own saliva. Patient also noted to have hit cuts.  Labs Reviewed   CBC WITH DIFFERENTIAL - Abnormal; Notable for the following:    WBC  13.0 (*)     Neutro Abs  8.4 (*)     All other components within normal limits   BASIC METABOLIC PANEL - Abnormal; Notable for the following:    Glucose, Bld  100 (*)  BUN  24 (*)     Creatinine, Ser  1.50 (*)     Calcium  10.7 (*)     GFR calc non Af Amer  38 (*)     GFR calc Af Amer  44 (*)     All other components within normal limits    No results found.  1.  Esophageal obstruction    MDM   Contacted GI who requested patient come to the endoscopy suite at [facility]. Patient given option for transfer her by [facility] but requested to take [facility]. vehicle. IV Hep-Lock established. Patient stable at time of discharge.   Nelia Shi, MD  01/08/13 [phone]

## 2013-01-08 NOTE — Interval H&P Note (Signed)
History and Physical Interval Note:  01/08/2013 3:33 PM  Don Montoya  has presented today for surgery, with the diagnosis of food impaction  The various methods of treatment have been discussed with the patient and family. After consideration of risks, benefits and other options for treatment, the patient has consented to  Procedure(s): ESOPHAGOGASTRODUODENOSCOPY (EGD) (N/A) as a surgical intervention .  The patient's history has been reviewed, patient examined, no change in status, stable for surgery.  I have reviewed the patient's chart and labs.  Questions were answered to the patient's satisfaction.     Venita Lick. Russella Dar MD

## 2013-01-09 ENCOUNTER — Telehealth: Payer: Self-pay | Admitting: Gastroenterology

## 2013-01-09 ENCOUNTER — Telehealth: Payer: Self-pay | Admitting: Internal Medicine

## 2013-01-09 NOTE — Telephone Encounter (Signed)
Pt has been scheduled for f/u with Dr Christella Hartigan

## 2013-01-09 NOTE — Telephone Encounter (Signed)
Patient Information:  Caller Name: Don Montoya  Phone: [phone]  Patient: Don Montoya, Don Montoya  Gender: Male  DOB: 1915/07/19  Age: 79 Years  PCP: Darryll Capers (Adults only)  Office Follow Up:  Does the office need to follow up with this patient?: No  Instructions For The Office: N/A   Symptoms  Reason For Call & Symptoms: Pts spouse is calling back to confirm appts. She thought the scheduled appt with Dr. Christella Hartigan on 01/15/13 was with Dr. Lovell Sheehan. RN checked EPIC and reassured pt per notes in chart that the pt is scheduled with Laurette Schimke MD on 01/15/13. Next appt with Dr. Lovell Sheehan is September.  Reviewed Health History In EMR: N/A  Reviewed Medications In EMR: N/A  Reviewed Allergies In EMR: N/A  Reviewed Surgeries / Procedures: N/A  Date of Onset of Symptoms: 01/09/2013  Guideline(s) Used:  No Protocol Available - Information Only  Disposition Per Guideline:   Home Care  Reason For Disposition Reached:   Information only question and nurse able to answer  Advice Given:  N/A  Patient Will Follow Care Advice:  YES

## 2013-01-10 ENCOUNTER — Encounter (HOSPITAL_COMMUNITY): Payer: Self-pay | Admitting: Gastroenterology

## 2013-01-10 DIAGNOSIS — K5900 Constipation, unspecified: Secondary | ICD-10-CM | POA: Diagnosis not present

## 2013-01-15 ENCOUNTER — Encounter: Payer: Self-pay | Admitting: Gastroenterology

## 2013-01-15 ENCOUNTER — Ambulatory Visit (INDEPENDENT_AMBULATORY_CARE_PROVIDER_SITE_OTHER): Payer: Medicare Other | Admitting: Gastroenterology

## 2013-01-15 ENCOUNTER — Telehealth: Payer: Self-pay

## 2013-01-15 VITALS — BP 132/80 | HR 83 | Ht 67.0 in | Wt 153.8 lb

## 2013-01-15 DIAGNOSIS — R1314 Dysphagia, pharyngoesophageal phase: Secondary | ICD-10-CM | POA: Diagnosis not present

## 2013-01-15 NOTE — Progress Notes (Signed)
Review of pertinent gastrointestinal problems:  1. 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 Russella Montoya cleared food impaction into stomach, noted benign appearing GE junction stricture (not dilated).  2. colonoscopy 2007 found diverticulosis, 2 diminutive polyps that were not removed due to age, comorbidities   HPI: This is a  very pleasant 79 year old man who is here with his wife today  He is very hard of hearing.    He is chewing his food well,  Taking very small bites since overt esophageal food impaction last week that was treated by Dr. Russella Montoya, EGD.     Past Medical History  Diagnosis Date  . Diabetes mellitus   . Diverticulosis   . Hypertension   . PVD (peripheral vascular disease)   . Renal insufficiency   . Stroke   . Hyperlipidemia   . GERD (gastroesophageal reflux disease)   . Constipation     Past Surgical History  Procedure Laterality Date  . Kidney stone surgery    . Vertebral artery stent    . Hernia repair      right inguinal  . Left groin exploration    . Esophagogastroduodenoscopy N/A 01/08/2013    Procedure: ESOPHAGOGASTRODUODENOSCOPY (EGD);  Surgeon: Meryl Dare, MD;  Location: [facility];  Service: Endoscopy;  Laterality: N/A;    Current Outpatient Prescriptions  Medication Sig Dispense Refill  . aspirin 81 MG tablet Take 81 mg by mouth daily.        . bisoprolol-hydrochlorothiazide (ZIAC) 2.5-6.25 MG per tablet Take 1 tablet by mouth daily.  30 tablet  11  . cephALEXin (KEFLEX) 500 MG capsule       . clopidogrel (PLAVIX) 75 MG tablet Take 1 tablet (75 mg total) by mouth daily.  90 tablet  3  . Gabapentin Enacarbil (HORIZANT) 600 MG TB24 Take 1 tablet by mouth at bedtime and may repeat dose one time if needed.  30 tablet  6  . glyBURIDE (DIABETA) 2.5 MG tablet Take 2.5 mg by mouth daily with breakfast.         . levocetirizine (XYZAL) 5 MG tablet Take 1 tablet (5 mg total) by mouth 2 (two) times daily.  60 tablet  11  . Multiple Vitamins-Minerals (ICAPS MV PO) Take by mouth 2 (two) times daily.        . simvastatin (ZOCOR) 80 MG tablet Take 1 tablet (80 mg total) by mouth at bedtime.  30 tablet  6  . Tamsulosin HCl (FLOMAX) 0.4 MG CAPS Take 2 capsules (0.8 mg total) by mouth daily.  60 capsule  11   No current facility-administered medications for this visit.    Allergies as of 01/15/2013 - Review Complete 01/15/2013  Allergen Reaction Noted  . Prednisone  05/12/2011    Family History  Problem Relation Age of Onset  . Diabetes Mother   . Heart disease Mother   . Colon cancer Neg Hx     History   Social History  . Marital Status: Married    Spouse Name: N/A    Number of Children: N/A  . Years of Education: N/A   Occupational History  . Retired    Social History Main Topics  . Smoking status: Never Smoker   . Smokeless tobacco: Never Used  . Alcohol Use: No  . Drug Use: No  . Sexually Active: Not Currently   Other Topics  Concern  . Not on file   Social History Narrative   Married    Independent            Physical Exam: BP 132/80  Pulse 83  Ht 5\' 7"  (1.702 m)  Wt 153 lb 12.8 oz (69.763 kg)  BMI 24.08 kg/m2  SpO2 97% Constitutional: generally well-appearing, very hard of hearing  Psychiatric: alert and oriented x3 Abdomen: soft, nontender, nondistended, no obvious ascites, no peritoneal signs, normal bowel sounds     Assessment and plan: 79 y.o. male with  esophageal stricture  he will continue taking small bites, eating slowly and chewing his food very well. I would like to repeat EGD for him in the next several days to perform esophageal dilation. He is on Plavix however and that would increase his risk for procedural bleeding. We will communicate with his primary care physician about the safety of him holding that medicine for 5 days prior to upper  endoscopy.

## 2013-01-15 NOTE — Patient Instructions (Addendum)
Chew your food very well, eat slowly, take small bites. We will contact Dr. Lovell Sheehan about stopping your plavix for 5 days prior to time of EGD. You will be set up for an upper endoscopy (LEC with balloon dilation moderate sedation) for your esophageal stricture, trouble swallowing.  You have been scheduled for an endoscopy with propofol. Please follow written instructions given to you at your visit today. If you use inhalers (even only as needed), please bring them with you on the day of your procedure. Your physician has requested that you go to www.startemmi.com and enter the access code given to you at your visit today. This web site gives a general overview about your procedure. However, you should still follow specific instructions given to you by our office regarding your preparation for the procedure.                                                We are excited to introduce MyChart, a new best-in-class service that provides you online access to important information in your electronic medical record. We want to make it easier for you to view your health information - all in one secure location - when and where you need it. We expect MyChart will enhance the quality of care and service we provide.  When you register for MyChart, you can:    View your test results.    Request appointments and receive appointment reminders via email.    Request medication renewals.    View your medical history, allergies, medications and immunizations.    Communicate with your physician's office through a password-protected site.    Conveniently print information such as your medication lists.  To find out if MyChart is right for you, please talk to a member of our clinical staff today. We will gladly answer your questions about this free health and wellness tool.  If you are age 74 or older and want a member of your family to have access to your record, you must provide written consent by  completing a proxy form available at our office. Please speak to our clinical staff about guidelines regarding accounts for patients younger than age 66.  As you activate your MyChart account and need any technical assistance, please call the MyChart technical support line at [phone]-CHART [phone]) or email your question to [email or web address]. If you email your question(s), please include your name, a return phone number and the best time to reach you.  If you have non-urgent health-related questions, you can send a message to our office through MyChart at [email or web address]. If you have a medical emergency, call 911.  Thank you for using MyChart as your new health and wellness resource!   MyChart licensed from [employer],  4540-9811. Patents Pending.

## 2013-01-15 NOTE — Telephone Encounter (Signed)
  01/15/2013    RE: AMARO MANGOLD DOB: 1915-01-22 MRN: [id number]   Dear Dr. Lovell Sheehan,    We have scheduled the above patient for an endoscopic procedure. Our records show that he is on anticoagulation therapy.   Please advise as to how long the patient may come off his therapy of Plavix prior to the procedure, which is scheduled for 01/22/2013.  Please fax back/ or route the completed form to [facility] at [phone].   Sincerely,  Alonna Buckler [facility] For Dr. Rob Bunting

## 2013-01-17 ENCOUNTER — Telehealth: Payer: Self-pay

## 2013-01-17 NOTE — Telephone Encounter (Signed)
Letter has been refaxed to Dr Lovell Sheehan

## 2013-01-17 NOTE — Telephone Encounter (Signed)
Mr Saladin may hold plavix and aspirin 7 days prior to endoscopy

## 2013-01-17 NOTE — Telephone Encounter (Signed)
Message copied by Donata Duff on Wed Jan 17, 2013  8:17 AM ------      Message from: Donata Duff      Created: Tue Jan 16, 2013  8:30 AM       Waiting on anti coag ------

## 2013-01-17 NOTE — Telephone Encounter (Signed)
Telephone Encounter Info    Author Note Status Last Update User Last Update Date/Time   Stacie Glaze, MD Signed Stacie Glaze, MD 01/17/2013 8:22 AM         Telephone Encounter    Mr Coia may hold plavix and aspirin 7 days prior to endoscopy     Pt has been notified to hold plavix

## 2013-01-22 ENCOUNTER — Encounter: Payer: Self-pay | Admitting: Gastroenterology

## 2013-01-22 ENCOUNTER — Ambulatory Visit (AMBULATORY_SURGERY_CENTER): Payer: Medicare Other | Admitting: Gastroenterology

## 2013-01-22 VITALS — BP 110/60 | HR 61 | Temp 97.8°F | Resp 16 | Ht 67.0 in | Wt 153.0 lb

## 2013-01-22 DIAGNOSIS — K219 Gastro-esophageal reflux disease without esophagitis: Secondary | ICD-10-CM | POA: Diagnosis not present

## 2013-01-22 DIAGNOSIS — K222 Esophageal obstruction: Secondary | ICD-10-CM

## 2013-01-22 DIAGNOSIS — R1314 Dysphagia, pharyngoesophageal phase: Secondary | ICD-10-CM

## 2013-01-22 MED ORDER — DEXTROSE 5 % IV SOLN: INTRAVENOUS | Status: DC

## 2013-01-22 NOTE — Progress Notes (Signed)
Patient did not experience any of the following events: a burn prior to discharge; a fall within the facility; wrong site/side/patient/procedure/implant event; or a hospital transfer or hospital admission upon discharge from the facility. (G8907) Patient did not have preoperative order for IV antibiotic SSI prophylaxis. (G8918)  

## 2013-01-22 NOTE — Op Note (Signed)
[facility] [address] [place], [address]   ENDOSCOPY PROCEDURE REPORT  PATIENT: Don Montoya, Don Montoya  MR#: [id number] BIRTHDATE: 02-16-1915 , 89  yrs. 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  Russella Dar cleared food impaction into stomach, noted benign appearing GE junction stricture (not dilated).. MEDICATIONS: Fentanyl 37.5 mcg IV, Versed 2 mg IV, and These medications were titrated to patient response per physician's verbal order TOPICAL ANESTHETIC: Cetacaine Spray  DESCRIPTION OF PROCEDURE: After the risks benefits and alternatives of the procedure were thoroughly explained, informed consent was obtained.  The LB [id number] endoscope was introduced through the mouth and advanced to the second portion of the duodenum. Without limitations.  The instrument was slowly withdrawn as the mucosa was fully examined.     Previously documented peptic, benign appearing stricture was noted again.  There was no clear neoplastic mucosa.  There was little to no resistence during passage of gastroscope through the stricture (lumen was 8-44mm) however passage of scope created oozing along one egde of the stricture that did not stop for 2-3 minutes of observation.  I biopsied the stricture but given his age, frailty and the friability of the stricture I did not dilated it.  The examination was otherwise normal.  Retroflexed views revealed no abnormalities.     The scope was then withdrawn from the patient and the procedure completed. COMPLICATIONS: There were no complications.  ENDOSCOPIC IMPRESSION: See above  RECOMMENDATIONS: He is not on any antiacid medicines and so I will  have him start twice daily prilosec (OTC), return to see me in office in 4 weeks, continue to chew his food well, eat slowly and take small bites. He can restart plavix today.  eSigned:  Rachael Fee, MD 01/22/2013 2:34 PM

## 2013-01-22 NOTE — Patient Instructions (Addendum)
Discharge instructions given with verbal understanding. Biopsies taken. Resume previous medications. YOU HAD AN ENDOSCOPIC PROCEDURE TODAY AT THE [facility]: Refer to the procedure report that was given to you for any specific questions about what was found during the examination.  If the procedure report does not answer your questions, please call your gastroenterologist to clarify.  If you requested that your care partner not be given the details of your procedure findings, then the procedure report has been included in a sealed envelope for you to review at your convenience later.  YOU SHOULD EXPECT: Some feelings of bloating in the abdomen. Passage of more gas than usual.  Walking can help get rid of the air that was put into your GI tract during the procedure and reduce the bloating. If you had a lower endoscopy (such as a colonoscopy or flexible sigmoidoscopy) you may notice spotting of blood in your stool or on the toilet paper. If you underwent a bowel prep for your procedure, then you may not have a normal bowel movement for a few days.  DIET: Your first meal following the procedure should be a light meal and then it is ok to progress to your normal diet.  A half-sandwich or bowl of soup is an example of a good first meal.  Heavy or fried foods are harder to digest and may make you feel nauseous or bloated.  Likewise meals heavy in dairy and vegetables can cause extra gas to form and this can also increase the bloating.  Drink plenty of fluids but you should avoid alcoholic beverages for 24 hours.  ACTIVITY: Your care partner should take you home directly after the procedure.  You should plan to take it easy, moving slowly for the rest of the day.  You can resume normal activity the day after the procedure however you should NOT DRIVE or use heavy machinery for 24 hours (because of the sedation medicines used during the test).    SYMPTOMS TO REPORT IMMEDIATELY: A gastroenterologist  can be reached at any hour.  During normal business hours, 8:30 AM to 5:00 PM Monday through Friday, call [phone].  After hours and on weekends, please call the GI answering service at [phone] who will take a message and have the physician on call contact you.   Following upper endoscopy (EGD)  Vomiting of blood or coffee ground material  New chest pain or pain under the shoulder blades  Painful or persistently difficult swallowing  New shortness of breath  Fever of 100F or higher  Black, tarry-looking stools  FOLLOW UP: If any biopsies were taken you will be contacted by phone or by letter within the next 1-3 weeks.  Call your gastroenterologist if you have not heard about the biopsies in 3 weeks.  Our staff will call the home number listed on your records the next business day following your procedure to check on you and address any questions or concerns that you may have at that time regarding the information given to you following your procedure. This is a courtesy call and so if there is no answer at the home number and we have not heard from you through the emergency physician on call, we will assume that you have returned to your regular daily activities without incident.  SIGNATURES/CONFIDENTIALITY: You and/or your care partner have signed paperwork which will be entered into your electronic medical record.  These signatures attest to the fact that that the information above on your After   Visit Summary has been reviewed and is understood.  Full responsibility of the confidentiality of this discharge information lies with you and/or your care-partner. 

## 2013-01-23 ENCOUNTER — Telehealth: Payer: Self-pay | Admitting: *Deleted

## 2013-01-23 NOTE — Telephone Encounter (Signed)
  Follow up Call-  Call back number 01/22/2013  Post procedure Call Back phone  # [phone]  Permission to leave phone message No     Patient questions:  Do you have a fever, pain , or abdominal swelling? no Pain Score  0 *  Have you tolerated food without any problems? yes  Have you been able to return to your normal activities? yes  Do you have any questions about your discharge instructions: Diet   no Medications  no Follow up visit  no  Do you have questions or concerns about your Care? no  Actions: * If pain score is 4 or above: No action needed, pain <4.

## 2013-01-31 DIAGNOSIS — K5900 Constipation, unspecified: Secondary | ICD-10-CM | POA: Diagnosis not present

## 2013-02-14 DIAGNOSIS — K219 Gastro-esophageal reflux disease without esophagitis: Secondary | ICD-10-CM | POA: Diagnosis not present

## 2013-02-14 DIAGNOSIS — K5900 Constipation, unspecified: Secondary | ICD-10-CM | POA: Diagnosis not present

## 2013-02-26 ENCOUNTER — Ambulatory Visit (INDEPENDENT_AMBULATORY_CARE_PROVIDER_SITE_OTHER): Payer: Medicare Other | Admitting: Gastroenterology

## 2013-02-26 ENCOUNTER — Encounter: Payer: Self-pay | Admitting: Gastroenterology

## 2013-02-26 VITALS — BP 100/60 | HR 88 | Ht 67.0 in | Wt 153.0 lb

## 2013-02-26 DIAGNOSIS — R1314 Dysphagia, pharyngoesophageal phase: Secondary | ICD-10-CM | POA: Diagnosis not present

## 2013-02-26 NOTE — Progress Notes (Signed)
Review of pertinent gastrointestinal problems:  1. 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 Russella Dar cleared food impaction into stomach, noted benign appearing GE junction stricture (not dilated).  12/2012 EGD, Dr. Christella Hartigan, previously known GE junction stricture, lumen 8-9 mm, passage of the scope created using, minor mucosal tear. (He was on Plavix which was held), dilation not performed given her age, friability of the stricture; biopsies of stricture showed no neoplasm he was started on twice daily proton pump inhibitor;  Office visit one month later, his swallowing was normal.  2. colonoscopy 2007 found diverticulosis, 2 diminutive polyps that were not removed due to age, comorbidities  HPI: This is a    Very pleasant nearly 79 year old man who is here with his wife today.   I last saw him at the time of an upper endoscopy about a month ago. See those results summarized above. He was actually not on antiacid medicine at the time which I corrected (recommended twice daily OTC prilosec).  He is swallowing normally, no dysphagia.     Past Medical History  Diagnosis Date  . Diabetes mellitus   . Diverticulosis   . Hypertension   . PVD (peripheral vascular disease)   . Renal insufficiency   . Stroke   . Hyperlipidemia   . GERD (gastroesophageal reflux disease)   . Constipation     Past Surgical History  Procedure Laterality Date  . Kidney stone surgery    . Vertebral artery stent    . Hernia repair      right inguinal  . Left groin exploration    . Esophagogastroduodenoscopy N/A 01/08/2013    Procedure: ESOPHAGOGASTRODUODENOSCOPY (EGD);  Surgeon: Meryl Dare, MD;  Location: [facility];  Service: Endoscopy;  Laterality: N/A;    Current Outpatient Prescriptions  Medication Sig Dispense Refill  . aspirin 81 MG tablet Take 81 mg by mouth  daily.        . bisoprolol-hydrochlorothiazide (ZIAC) 2.5-6.25 MG per tablet Take 1 tablet by mouth daily.  30 tablet  11  . cephALEXin (KEFLEX) 500 MG capsule       . clopidogrel (PLAVIX) 75 MG tablet Take 1 tablet (75 mg total) by mouth daily.  90 tablet  3  . Gabapentin Enacarbil (HORIZANT) 600 MG TB24 Take 1 tablet by mouth at bedtime and may repeat dose one time if needed.  30 tablet  6  . glyBURIDE (DIABETA) 2.5 MG tablet Take 2.5 mg by mouth daily with breakfast.        . levocetirizine (XYZAL) 5 MG tablet Take 1 tablet (5 mg total) by mouth 2 (two) times daily.  60 tablet  11  . Multiple Vitamins-Minerals (ICAPS MV PO) Take by mouth 2 (two) times daily.        . simvastatin (ZOCOR) 80 MG tablet Take 1 tablet (80 mg total) by mouth at bedtime.  30 tablet  6  . Tamsulosin HCl (FLOMAX) 0.4 MG CAPS Take 2 capsules (0.8 mg total) by mouth daily.  60 capsule  11   No current facility-administered medications for this visit.    Allergies as of 02/26/2013 - Review Complete 02/26/2013  Allergen Reaction Noted  . Prednisone  05/12/2011    Family History  Problem Relation Age of Onset  . Diabetes Mother   . Heart disease Mother   . Colon cancer Neg Hx  History   Social History  . Marital Status: Married    Spouse Name: N/A    Number of Children: N/A  . Years of Education: N/A   Occupational History  . Retired    Social History Main Topics  . Smoking status: Never Smoker   . Smokeless tobacco: Never Used  . Alcohol Use: No  . Drug Use: No  . Sexually Active: Not Currently   Other Topics Concern  . Not on file   Social History Narrative   Married    Independent            Physical Exam: BP 100/60  Pulse 88  Ht 5\' 7"  (1.702 m)  Wt 153 lb (69.4 kg)  BMI 23.96 kg/m2 Constitutional: generally well-appearing Psychiatric: alert and oriented x3 Abdomen: soft, nontender, nondistended, no obvious ascites, no peritoneal signs, normal bowel  sounds     Assessment and plan: 79 y.o. 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 His stricture is likely GERD, acid related. Last EGD a month and half ago, summarized above. He knows call he has further dysphagia troubles.

## 2013-02-26 NOTE — Patient Instructions (Addendum)
Continue prilosec (one pill twice daily). This will help decrease the chance of you needing further endoscopies. Call if your swallowing worsens again.

## 2013-03-30 DIAGNOSIS — M2040 Other hammer toe(s) (acquired), unspecified foot: Secondary | ICD-10-CM | POA: Diagnosis not present

## 2013-03-30 DIAGNOSIS — L851 Acquired keratosis [keratoderma] palmaris et plantaris: Secondary | ICD-10-CM | POA: Diagnosis not present

## 2013-03-30 DIAGNOSIS — B351 Tinea unguium: Secondary | ICD-10-CM | POA: Diagnosis not present

## 2013-03-30 DIAGNOSIS — E1159 Type 2 diabetes mellitus with other circulatory complications: Secondary | ICD-10-CM | POA: Diagnosis not present

## 2013-04-02 ENCOUNTER — Encounter: Payer: Self-pay | Admitting: Internal Medicine

## 2013-04-02 ENCOUNTER — Ambulatory Visit (INDEPENDENT_AMBULATORY_CARE_PROVIDER_SITE_OTHER): Payer: Medicare Other | Admitting: Internal Medicine

## 2013-04-02 VITALS — BP 140/88 | HR 78 | Temp 98.5°F | Wt 152.0 lb

## 2013-04-02 DIAGNOSIS — R609 Edema, unspecified: Secondary | ICD-10-CM | POA: Diagnosis not present

## 2013-04-02 DIAGNOSIS — Z23 Encounter for immunization: Secondary | ICD-10-CM | POA: Diagnosis not present

## 2013-04-02 DIAGNOSIS — E1165 Type 2 diabetes mellitus with hyperglycemia: Secondary | ICD-10-CM | POA: Diagnosis not present

## 2013-04-02 NOTE — Progress Notes (Signed)
Subjective:    Patient ID: Don Montoya, male    DOB: 07-06-1915, 79 y.o.   MRN: [id number]  HPI 79 year old male with history of hypertension diabetes and progressive dementia. Taking many of his medications. He states that he does not believe that the medications make him  worse or better. He has noted swelling in his lower extremities but he states that he does not have any painful neuropathy.  He does not check his blood sugars. He has a history of peripheral vascular disease on Plavix and he is on high-dose statin therapy.      Review of Systems  HENT: Positive for congestion and rhinorrhea. Negative for nosebleeds and neck stiffness.   Eyes: Negative for pain, discharge and itching.  Respiratory: Negative for chest tightness and shortness of breath.   Genitourinary: Positive for frequency.  Neurological: Positive for weakness.  Psychiatric/Behavioral: Positive for sleep disturbance.   Past Medical History  Diagnosis Date  . Diabetes mellitus   . Diverticulosis   . Hypertension   . PVD (peripheral vascular disease)   . Renal insufficiency   . Stroke   . Hyperlipidemia   . GERD (gastroesophageal reflux disease)   . Constipation     History   Social History  . Marital Status: Married    Spouse Name: N/A    Number of Children: N/A  . Years of Education: N/A   Occupational History  . Retired    Social History Main Topics  . Smoking status: Never Smoker   . Smokeless tobacco: Never Used  . Alcohol Use: No  . Drug Use: No  . Sexual Activity: Not Currently   Other Topics Concern  . Not on file   Social History Narrative   Married    Independent          Past Surgical History  Procedure Laterality Date  . Kidney stone surgery    . Vertebral artery stent    . Hernia repair      right inguinal  . Left groin exploration    . Esophagogastroduodenoscopy N/A 01/08/2013    Procedure: ESOPHAGOGASTRODUODENOSCOPY (EGD);  Surgeon: Meryl Dare, MD;   Location: [facility];  Service: Endoscopy;  Laterality: N/A;    Family History  Problem Relation Age of Onset  . Diabetes Mother   . Heart disease Mother   . Colon cancer Neg Hx     Allergies  Allergen Reactions  . Prednisone     psycosis    Current Outpatient Prescriptions on File Prior to Visit  Medication Sig Dispense Refill  . aspirin 81 MG tablet Take 81 mg by mouth daily.        . bisoprolol-hydrochlorothiazide (ZIAC) 2.5-6.25 MG per tablet Take 1 tablet by mouth daily.  30 tablet  11  . cephALEXin (KEFLEX) 500 MG capsule       . clopidogrel (PLAVIX) 75 MG tablet Take 1 tablet (75 mg total) by mouth daily.  90 tablet  3  . Gabapentin Enacarbil (HORIZANT) 600 MG TB24 Take 1 tablet by mouth at bedtime and may repeat dose one time if needed.  30 tablet  6  . glyBURIDE (DIABETA) 2.5 MG tablet Take 2.5 mg by mouth daily with breakfast.        . levocetirizine (XYZAL) 5 MG tablet Take 1 tablet (5 mg total) by mouth 2 (two) times daily.  60 tablet  11  . Multiple Vitamins-Minerals (ICAPS MV PO) Take by mouth 2 (two) times daily.        Marland Kitchen  simvastatin (ZOCOR) 80 MG tablet Take 1 tablet (80 mg total) by mouth at bedtime.  30 tablet  6  . Tamsulosin HCl (FLOMAX) 0.4 MG CAPS Take 2 capsules (0.8 mg total) by mouth daily.  60 capsule  11   No current facility-administered medications on file prior to visit.    BP 140/88  Pulse 78  Temp(Src) 98.5 F (36.9 C)  Wt 152 lb (68.947 kg)  BMI 23.8 kg/m2  \    Objective:   Physical Exam  Nursing note reviewed. Constitutional: He is oriented to person, place, and time. He appears well-developed and well-nourished.  HENT:  Head: Normocephalic and atraumatic.  Eyes: Conjunctivae are normal. Pupils are equal, round, and reactive to light.  Neck: Normal range of motion. Neck supple.  Cardiovascular: Normal rate and regular rhythm.   Pulmonary/Chest: Effort normal and breath sounds normal.  Abdominal: Soft. Bowel sounds are normal.   Musculoskeletal: Normal range of motion.  Neurological: He is alert and oriented to person, place, and time.  Psychiatric: He has a normal mood and affect. His behavior is normal.          Assessment & Plan:  Due to his dementia is poor visualization of what he needs to take his medications.  We went to the medication list simplified it to the pain medications intramuscularly on.  Due to the powder today we were unable to draw his blood work however we will draw his blood work at his next office visit to monitor his hemoglobin A1c and a basic metabolic panel.  His wife reports he has not had any hypoglycemic episodes.  I will discontinue his Zocor due to his noncompliance and his age . The gabapentin due to the risk of sedation

## 2013-04-18 ENCOUNTER — Telehealth: Payer: Self-pay | Admitting: Internal Medicine

## 2013-04-18 DIAGNOSIS — L219 Seborrheic dermatitis, unspecified: Secondary | ICD-10-CM | POA: Diagnosis not present

## 2013-04-18 DIAGNOSIS — L821 Other seborrheic keratosis: Secondary | ICD-10-CM | POA: Diagnosis not present

## 2013-04-18 DIAGNOSIS — L259 Unspecified contact dermatitis, unspecified cause: Secondary | ICD-10-CM | POA: Diagnosis not present

## 2013-04-18 NOTE — Telephone Encounter (Signed)
Pt wife informed

## 2013-04-18 NOTE — Telephone Encounter (Signed)
fyi

## 2013-04-18 NOTE — Telephone Encounter (Signed)
Wife called. She states that Don Montoya stopped taking his Plavix, because he was concerned it was causing bowel issues, and potentially blood in stool, which he thought he noticed. She states that since he has stopped taking it, he states he feels better. She wanted Dr. Lovell Sheehan to know that Don Montoya is not taking it, because she is not sure if that is a good thing/bad thing. She is requesting Dr. Lovell Sheehan' opinion and a call back.

## 2013-04-18 NOTE — Telephone Encounter (Signed)
If he is not on the plavix his risk of a stoke is increased. But the medication does have risk of bleeding. If he stops the plavix tell him to take a 81mg  aspirin daily

## 2013-06-04 DIAGNOSIS — B351 Tinea unguium: Secondary | ICD-10-CM | POA: Diagnosis not present

## 2013-06-04 DIAGNOSIS — E1159 Type 2 diabetes mellitus with other circulatory complications: Secondary | ICD-10-CM | POA: Diagnosis not present

## 2013-06-04 DIAGNOSIS — L851 Acquired keratosis [keratoderma] palmaris et plantaris: Secondary | ICD-10-CM | POA: Diagnosis not present

## 2013-06-05 DIAGNOSIS — K5730 Diverticulosis of large intestine without perforation or abscess without bleeding: Secondary | ICD-10-CM | POA: Diagnosis not present

## 2013-06-05 DIAGNOSIS — K5900 Constipation, unspecified: Secondary | ICD-10-CM | POA: Diagnosis not present

## 2013-06-28 DIAGNOSIS — H3570 Unspecified separation of retinal layers: Secondary | ICD-10-CM | POA: Diagnosis not present

## 2013-06-28 DIAGNOSIS — H353190 Nonexudative age-related macular degeneration, unspecified eye, stage unspecified: Secondary | ICD-10-CM | POA: Diagnosis not present

## 2013-06-28 DIAGNOSIS — H35369 Drusen (degenerative) of macula, unspecified eye: Secondary | ICD-10-CM | POA: Diagnosis not present

## 2013-07-01 DIAGNOSIS — R1310 Dysphagia, unspecified: Secondary | ICD-10-CM | POA: Diagnosis not present

## 2013-07-01 DIAGNOSIS — K922 Gastrointestinal hemorrhage, unspecified: Secondary | ICD-10-CM | POA: Diagnosis not present

## 2013-07-01 DIAGNOSIS — N183 Chronic kidney disease, stage 3 unspecified: Secondary | ICD-10-CM | POA: Diagnosis not present

## 2013-07-01 DIAGNOSIS — R933 Abnormal findings on diagnostic imaging of other parts of digestive tract: Secondary | ICD-10-CM | POA: Diagnosis not present

## 2013-07-01 DIAGNOSIS — J690 Pneumonitis due to inhalation of food and vomit: Secondary | ICD-10-CM | POA: Diagnosis not present

## 2013-07-01 DIAGNOSIS — E119 Type 2 diabetes mellitus without complications: Secondary | ICD-10-CM | POA: Diagnosis not present

## 2013-07-01 DIAGNOSIS — Z7902 Long term (current) use of antithrombotics/antiplatelets: Secondary | ICD-10-CM | POA: Diagnosis not present

## 2013-07-01 DIAGNOSIS — I739 Peripheral vascular disease, unspecified: Secondary | ICD-10-CM | POA: Diagnosis present

## 2013-07-01 DIAGNOSIS — K5900 Constipation, unspecified: Secondary | ICD-10-CM | POA: Diagnosis present

## 2013-07-01 DIAGNOSIS — K219 Gastro-esophageal reflux disease without esophagitis: Secondary | ICD-10-CM | POA: Diagnosis present

## 2013-07-01 DIAGNOSIS — K222 Esophageal obstruction: Secondary | ICD-10-CM | POA: Diagnosis not present

## 2013-07-01 DIAGNOSIS — Z79899 Other long term (current) drug therapy: Secondary | ICD-10-CM | POA: Diagnosis not present

## 2013-07-01 DIAGNOSIS — R0602 Shortness of breath: Secondary | ICD-10-CM | POA: Diagnosis not present

## 2013-07-01 DIAGNOSIS — R070 Pain in throat: Secondary | ICD-10-CM | POA: Diagnosis present

## 2013-07-01 DIAGNOSIS — K2210 Ulcer of esophagus without bleeding: Secondary | ICD-10-CM | POA: Diagnosis not present

## 2013-07-01 DIAGNOSIS — T18108A Unspecified foreign body in esophagus causing other injury, initial encounter: Secondary | ICD-10-CM | POA: Diagnosis not present

## 2013-07-01 DIAGNOSIS — K920 Hematemesis: Secondary | ICD-10-CM | POA: Diagnosis not present

## 2013-07-01 DIAGNOSIS — D72829 Elevated white blood cell count, unspecified: Secondary | ICD-10-CM | POA: Diagnosis present

## 2013-07-01 DIAGNOSIS — G609 Hereditary and idiopathic neuropathy, unspecified: Secondary | ICD-10-CM | POA: Diagnosis present

## 2013-07-01 DIAGNOSIS — Z888 Allergy status to other drugs, medicaments and biological substances status: Secondary | ICD-10-CM | POA: Diagnosis not present

## 2013-07-01 DIAGNOSIS — M795 Residual foreign body in soft tissue: Secondary | ICD-10-CM | POA: Diagnosis not present

## 2013-07-01 DIAGNOSIS — I129 Hypertensive chronic kidney disease with stage 1 through stage 4 chronic kidney disease, or unspecified chronic kidney disease: Secondary | ICD-10-CM | POA: Diagnosis present

## 2013-07-01 DIAGNOSIS — R042 Hemoptysis: Secondary | ICD-10-CM | POA: Diagnosis not present

## 2013-07-06 ENCOUNTER — Other Ambulatory Visit: Payer: Self-pay | Admitting: *Deleted

## 2013-07-06 MED ORDER — FAMOTIDINE 10 MG PO CHEW: 10.0000 mg | CHEWABLE_TABLET | Freq: Two times a day (BID) | ORAL | Status: DC

## 2013-07-07 DIAGNOSIS — E119 Type 2 diabetes mellitus without complications: Secondary | ICD-10-CM | POA: Diagnosis not present

## 2013-07-07 DIAGNOSIS — K5730 Diverticulosis of large intestine without perforation or abscess without bleeding: Secondary | ICD-10-CM | POA: Diagnosis not present

## 2013-07-07 DIAGNOSIS — N189 Chronic kidney disease, unspecified: Secondary | ICD-10-CM | POA: Diagnosis not present

## 2013-07-07 DIAGNOSIS — R1310 Dysphagia, unspecified: Secondary | ICD-10-CM | POA: Diagnosis not present

## 2013-07-07 DIAGNOSIS — Z8673 Personal history of transient ischemic attack (TIA), and cerebral infarction without residual deficits: Secondary | ICD-10-CM | POA: Diagnosis not present

## 2013-07-07 DIAGNOSIS — G609 Hereditary and idiopathic neuropathy, unspecified: Secondary | ICD-10-CM | POA: Diagnosis not present

## 2013-07-07 DIAGNOSIS — K222 Esophageal obstruction: Secondary | ICD-10-CM | POA: Diagnosis not present

## 2013-07-07 DIAGNOSIS — M109 Gout, unspecified: Secondary | ICD-10-CM | POA: Diagnosis not present

## 2013-07-07 DIAGNOSIS — I129 Hypertensive chronic kidney disease with stage 1 through stage 4 chronic kidney disease, or unspecified chronic kidney disease: Secondary | ICD-10-CM | POA: Diagnosis not present

## 2013-07-09 DIAGNOSIS — I129 Hypertensive chronic kidney disease with stage 1 through stage 4 chronic kidney disease, or unspecified chronic kidney disease: Secondary | ICD-10-CM | POA: Diagnosis not present

## 2013-07-09 DIAGNOSIS — R1310 Dysphagia, unspecified: Secondary | ICD-10-CM | POA: Diagnosis not present

## 2013-07-09 DIAGNOSIS — N189 Chronic kidney disease, unspecified: Secondary | ICD-10-CM | POA: Diagnosis not present

## 2013-07-09 DIAGNOSIS — E119 Type 2 diabetes mellitus without complications: Secondary | ICD-10-CM | POA: Diagnosis not present

## 2013-07-09 DIAGNOSIS — K222 Esophageal obstruction: Secondary | ICD-10-CM | POA: Diagnosis not present

## 2013-07-09 DIAGNOSIS — G609 Hereditary and idiopathic neuropathy, unspecified: Secondary | ICD-10-CM | POA: Diagnosis not present

## 2013-07-10 DIAGNOSIS — R1310 Dysphagia, unspecified: Secondary | ICD-10-CM | POA: Diagnosis not present

## 2013-07-10 DIAGNOSIS — I129 Hypertensive chronic kidney disease with stage 1 through stage 4 chronic kidney disease, or unspecified chronic kidney disease: Secondary | ICD-10-CM | POA: Diagnosis not present

## 2013-07-10 DIAGNOSIS — K222 Esophageal obstruction: Secondary | ICD-10-CM | POA: Diagnosis not present

## 2013-07-10 DIAGNOSIS — N189 Chronic kidney disease, unspecified: Secondary | ICD-10-CM | POA: Diagnosis not present

## 2013-07-10 DIAGNOSIS — G609 Hereditary and idiopathic neuropathy, unspecified: Secondary | ICD-10-CM | POA: Diagnosis not present

## 2013-07-10 DIAGNOSIS — E119 Type 2 diabetes mellitus without complications: Secondary | ICD-10-CM | POA: Diagnosis not present

## 2013-07-12 DIAGNOSIS — R1310 Dysphagia, unspecified: Secondary | ICD-10-CM | POA: Diagnosis not present

## 2013-07-12 DIAGNOSIS — N189 Chronic kidney disease, unspecified: Secondary | ICD-10-CM | POA: Diagnosis not present

## 2013-07-12 DIAGNOSIS — E119 Type 2 diabetes mellitus without complications: Secondary | ICD-10-CM | POA: Diagnosis not present

## 2013-07-12 DIAGNOSIS — G609 Hereditary and idiopathic neuropathy, unspecified: Secondary | ICD-10-CM | POA: Diagnosis not present

## 2013-07-12 DIAGNOSIS — K222 Esophageal obstruction: Secondary | ICD-10-CM | POA: Diagnosis not present

## 2013-07-12 DIAGNOSIS — I129 Hypertensive chronic kidney disease with stage 1 through stage 4 chronic kidney disease, or unspecified chronic kidney disease: Secondary | ICD-10-CM | POA: Diagnosis not present

## 2013-07-13 DIAGNOSIS — R1310 Dysphagia, unspecified: Secondary | ICD-10-CM | POA: Diagnosis not present

## 2013-07-13 DIAGNOSIS — I129 Hypertensive chronic kidney disease with stage 1 through stage 4 chronic kidney disease, or unspecified chronic kidney disease: Secondary | ICD-10-CM | POA: Diagnosis not present

## 2013-07-13 DIAGNOSIS — E119 Type 2 diabetes mellitus without complications: Secondary | ICD-10-CM | POA: Diagnosis not present

## 2013-07-13 DIAGNOSIS — G609 Hereditary and idiopathic neuropathy, unspecified: Secondary | ICD-10-CM | POA: Diagnosis not present

## 2013-07-13 DIAGNOSIS — K222 Esophageal obstruction: Secondary | ICD-10-CM | POA: Diagnosis not present

## 2013-07-13 DIAGNOSIS — N189 Chronic kidney disease, unspecified: Secondary | ICD-10-CM | POA: Diagnosis not present

## 2013-07-16 DIAGNOSIS — R1310 Dysphagia, unspecified: Secondary | ICD-10-CM | POA: Diagnosis not present

## 2013-07-16 DIAGNOSIS — G609 Hereditary and idiopathic neuropathy, unspecified: Secondary | ICD-10-CM | POA: Diagnosis not present

## 2013-07-16 DIAGNOSIS — K222 Esophageal obstruction: Secondary | ICD-10-CM | POA: Diagnosis not present

## 2013-07-16 DIAGNOSIS — N189 Chronic kidney disease, unspecified: Secondary | ICD-10-CM | POA: Diagnosis not present

## 2013-07-16 DIAGNOSIS — E119 Type 2 diabetes mellitus without complications: Secondary | ICD-10-CM | POA: Diagnosis not present

## 2013-07-16 DIAGNOSIS — I129 Hypertensive chronic kidney disease with stage 1 through stage 4 chronic kidney disease, or unspecified chronic kidney disease: Secondary | ICD-10-CM | POA: Diagnosis not present

## 2013-07-17 ENCOUNTER — Telehealth: Payer: Self-pay | Admitting: Internal Medicine

## 2013-07-17 NOTE — Telephone Encounter (Signed)
[facility] called to notify you that the patient wanted to r/s him home visit from 07/17/13 to 07/20/13.

## 2013-07-17 NOTE — Telephone Encounter (Signed)
ok 

## 2013-07-19 IMAGING — CT CT ABD-PELV W/ CM
2 of 5 series · 17 of 46 positions shown, 19 images · IV contrast (APPLIED)
Comparison: Multiple exams, including 05/06/2011

CLINICAL DATA: Abdominal pain.  Constipation.

CT ABDOMEN AND PELVIS WITH CONTRAST
TECHNIQUE: Multidetector CT imaging of the abdomen and pelvis was
performed following the standard protocol during bolus
administration of intravenous contrast.
Contrast: 80mL OMNIPAQUE IOHEXOL 300 MG/ML IV SOLN

[Series 2: abd/pelv with 5.0 b31f st · axial · 0.77mm/px · z∈[-718,-318]mm · 14 of 90 slices shown, 16 images]
[im 5/90  soft-tissue]
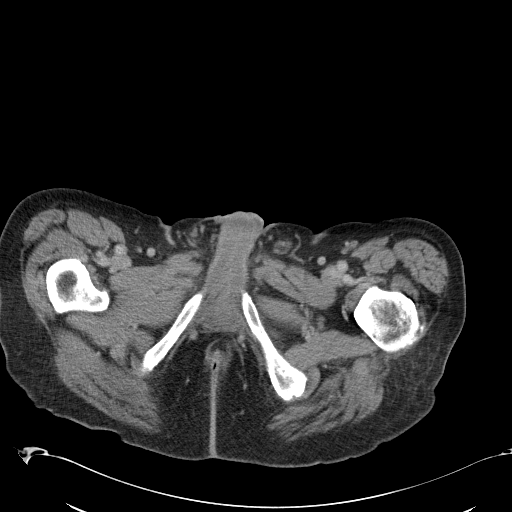
[im 5/90  bone]
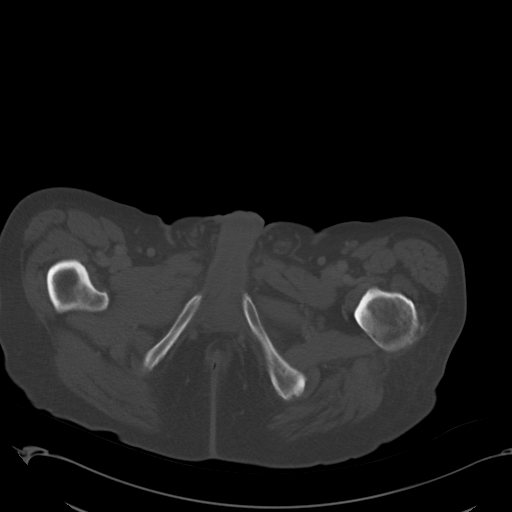
[im 10/90  soft-tissue]
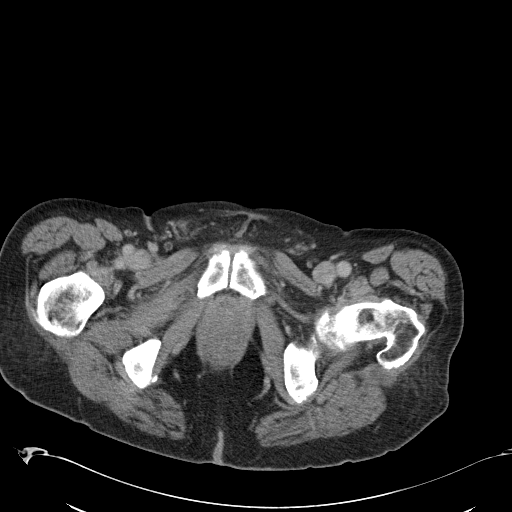
[im 20/90  soft-tissue]
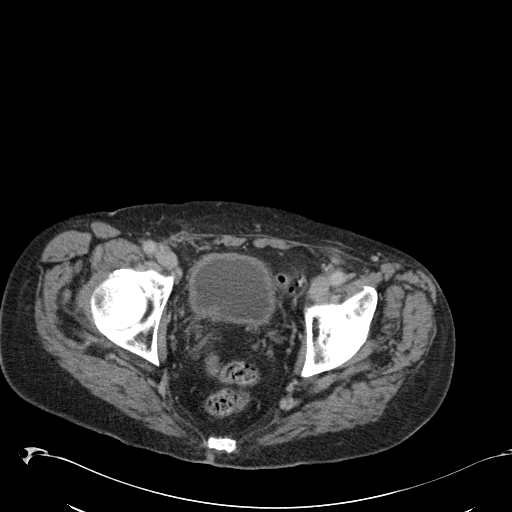
[im 25/90  soft-tissue]
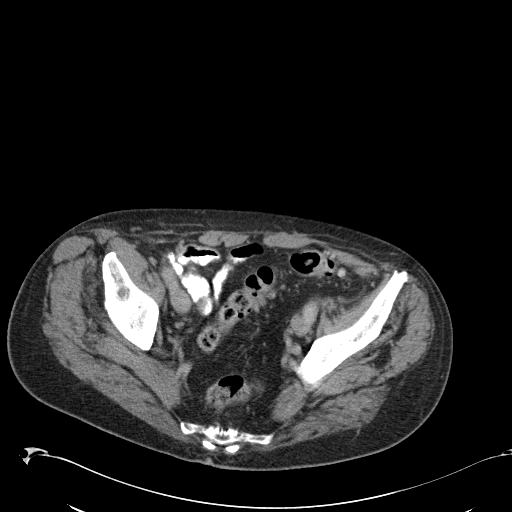
[im 30/90  soft-tissue]
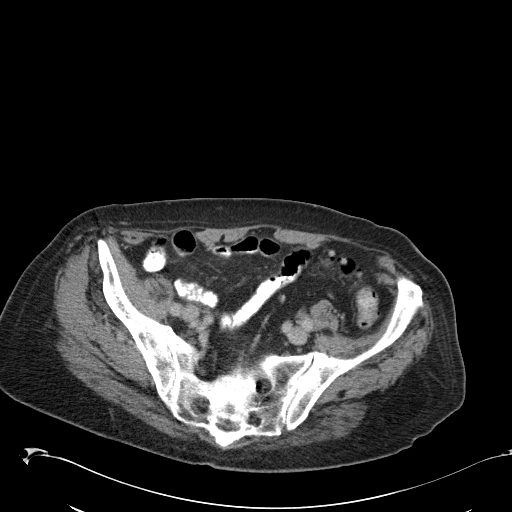
[im 35/90  soft-tissue]
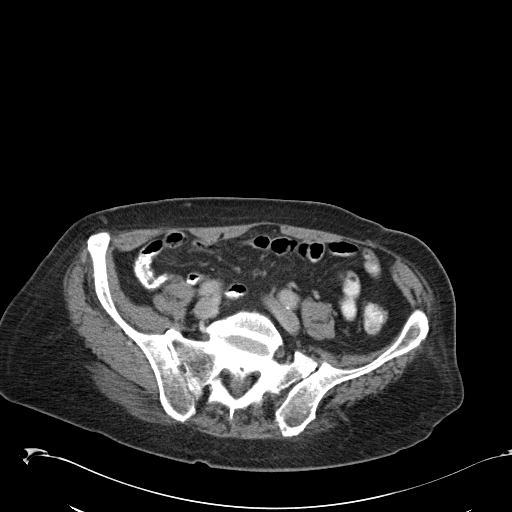
[im 40/90  soft-tissue]
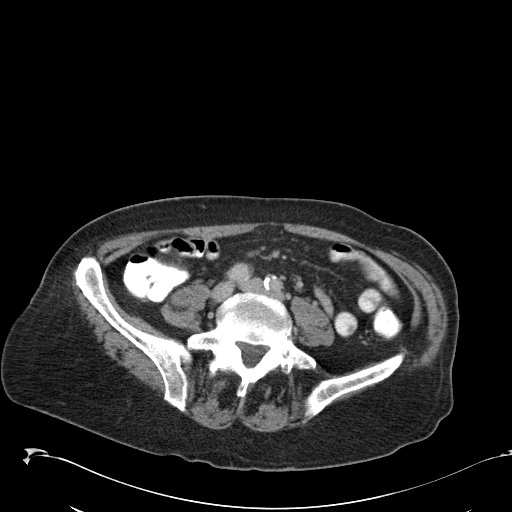
[im 50/90  soft-tissue]
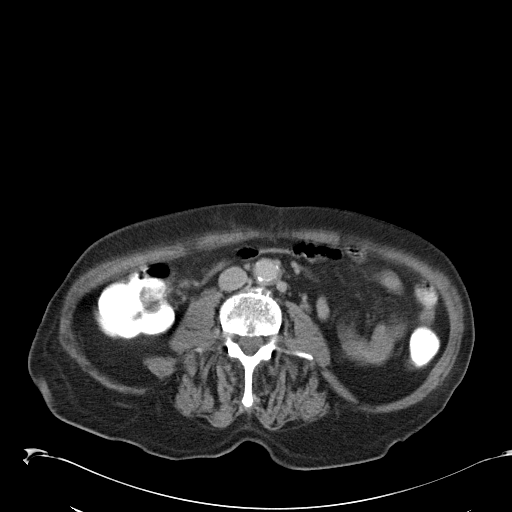
[im 55/90  soft-tissue]
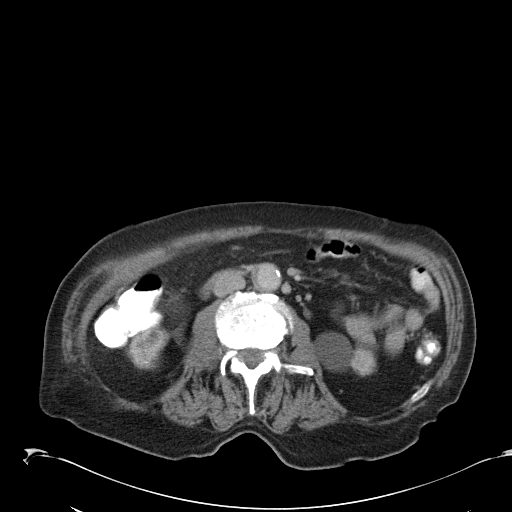
[im 55/90  bone]
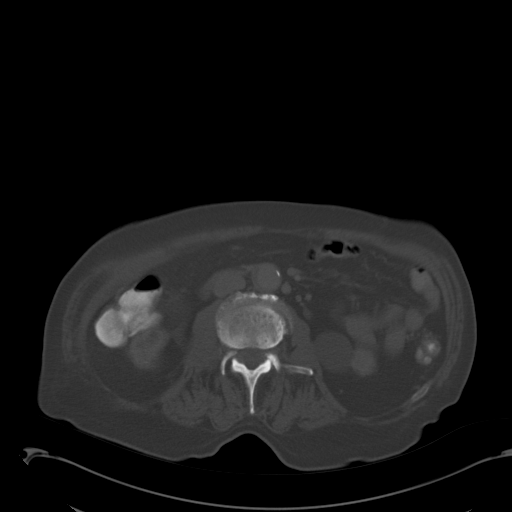
[im 60/90  soft-tissue]
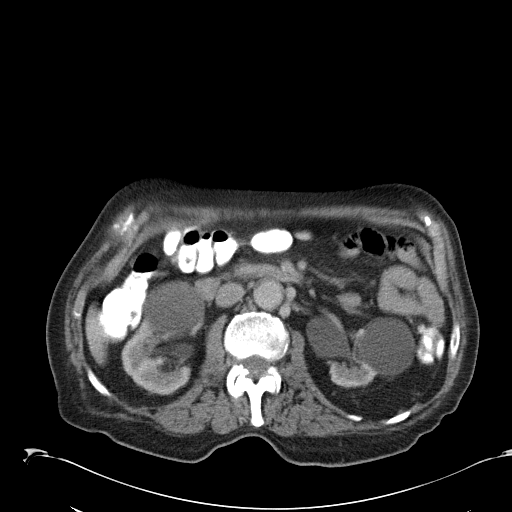
[im 65/90  soft-tissue]
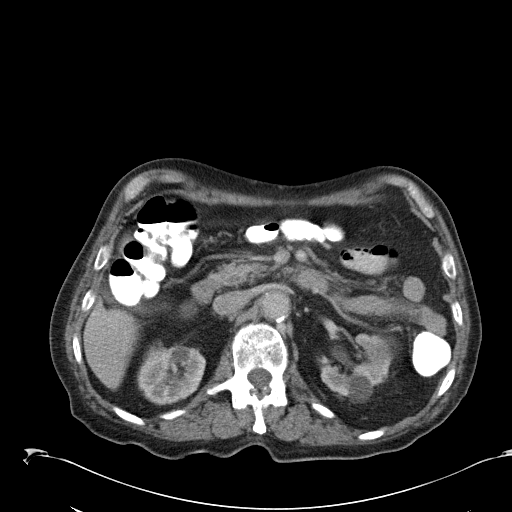
[im 70/90  soft-tissue]
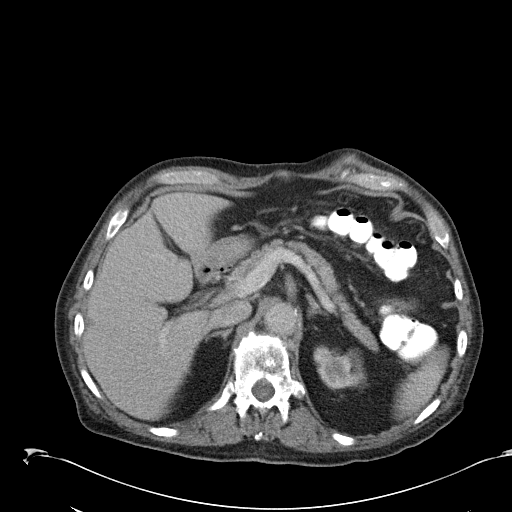
[im 80/90  soft-tissue]
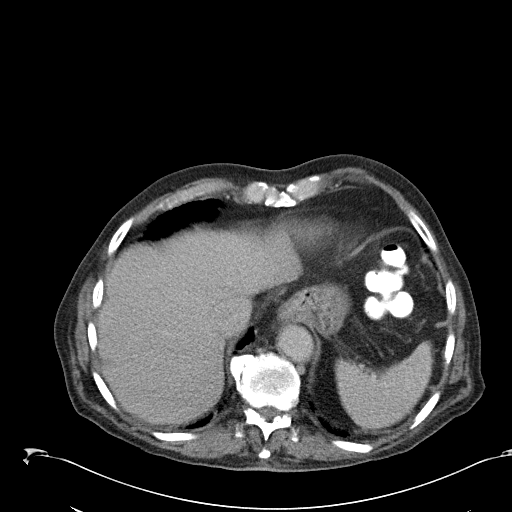
[im 85/90  soft-tissue]
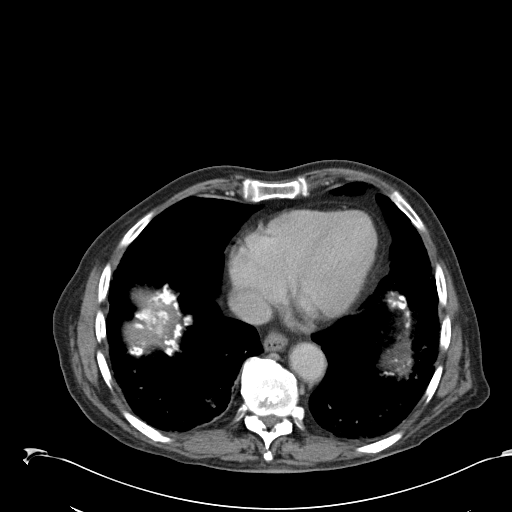

[Series 5: abd/pelv with 3.0 spo cor st · coronal · 0.90mm/px · 3 of 70 slices shown]
[im 24/70  soft-tissue]
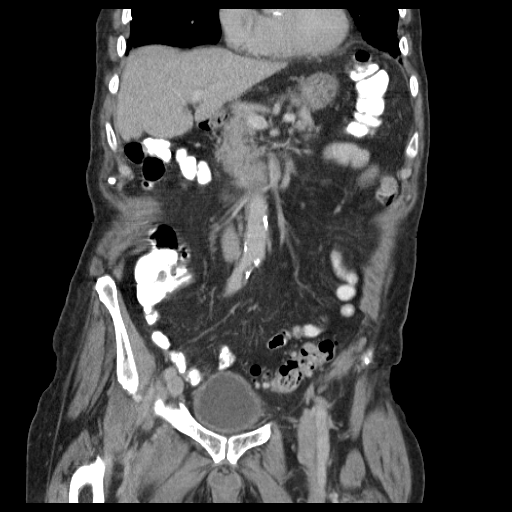
[im 31/70  soft-tissue]
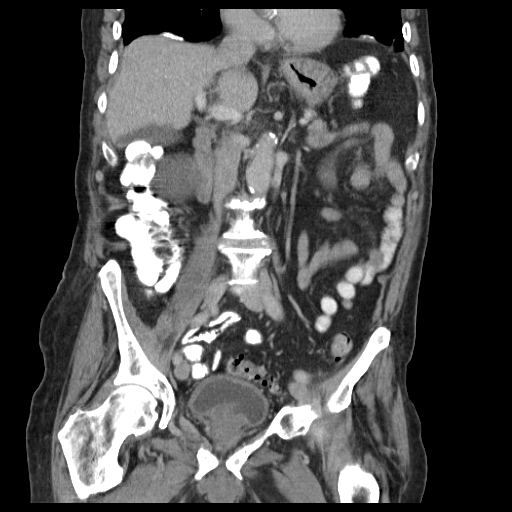
[im 39/70  soft-tissue]
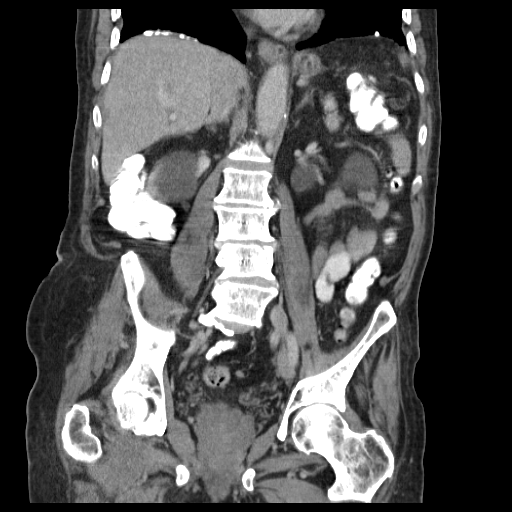

[17 of 46 positions shown; findings below may reference images not displayed]

FINDINGS: Calcified pleural plaques in the lung bases are
compatible with prior asbestos exposure.

A 0.7 cm right middle lobe nodule is stable from the measurement in
8991, and accordingly likely benign.  The liver, spleen, pancreas,
and adrenal glands appear unremarkable.

The gallbladder and biliary system appear unremarkable.

Benign appearing bilateral renal cysts are noted.  Borderline wall
thickening of the urinary bladder is ascribed to nondistension.
There is mild prominence the prostate gland which indents the
bladder base.  Some of the renal cystic lesions are technically too
small to characterize.

A small right inguinal hernia contains adipose tissue. No
pathologic pelvic adenopathy is identified.

Lumbar spondylosis and degenerative disc disease noted with grade 1
posterior subluxation of L2 on L3 contributing to left foraminal
stenosis at this level.
IMPRESSION: 1.  Calcified pleural plaques.
2.  Stable 0.7 cm right middle lobe nodule.  Given the stability
for 4 years, this is considered benign.
3.  Bilateral benign appearing renal cysts.
4.  Mild prominence of prostate gland.
5.  Small right inguinal hernia contains adipose tissue.
6.  Lumbar spondylosis and degenerative disc disease.
7.  A specific cause for the patient's abdominal pain is not
observed.  We do not demonstrate an abnormal stool burden in the
colon.
8.  Sigmoid diverticulosis is present without current active
diverticulitis.

## 2013-07-21 DIAGNOSIS — E119 Type 2 diabetes mellitus without complications: Secondary | ICD-10-CM | POA: Diagnosis not present

## 2013-07-21 DIAGNOSIS — G609 Hereditary and idiopathic neuropathy, unspecified: Secondary | ICD-10-CM | POA: Diagnosis not present

## 2013-07-21 DIAGNOSIS — I129 Hypertensive chronic kidney disease with stage 1 through stage 4 chronic kidney disease, or unspecified chronic kidney disease: Secondary | ICD-10-CM | POA: Diagnosis not present

## 2013-07-21 DIAGNOSIS — R1310 Dysphagia, unspecified: Secondary | ICD-10-CM | POA: Diagnosis not present

## 2013-07-21 DIAGNOSIS — K222 Esophageal obstruction: Secondary | ICD-10-CM | POA: Diagnosis not present

## 2013-07-21 DIAGNOSIS — N189 Chronic kidney disease, unspecified: Secondary | ICD-10-CM | POA: Diagnosis not present

## 2013-07-23 ENCOUNTER — Ambulatory Visit: Payer: Medicare Other | Admitting: Gastroenterology

## 2013-07-25 DIAGNOSIS — R1310 Dysphagia, unspecified: Secondary | ICD-10-CM | POA: Diagnosis not present

## 2013-07-25 DIAGNOSIS — G609 Hereditary and idiopathic neuropathy, unspecified: Secondary | ICD-10-CM | POA: Diagnosis not present

## 2013-07-25 DIAGNOSIS — I129 Hypertensive chronic kidney disease with stage 1 through stage 4 chronic kidney disease, or unspecified chronic kidney disease: Secondary | ICD-10-CM | POA: Diagnosis not present

## 2013-07-25 DIAGNOSIS — N189 Chronic kidney disease, unspecified: Secondary | ICD-10-CM | POA: Diagnosis not present

## 2013-07-25 DIAGNOSIS — E119 Type 2 diabetes mellitus without complications: Secondary | ICD-10-CM | POA: Diagnosis not present

## 2013-07-25 DIAGNOSIS — K222 Esophageal obstruction: Secondary | ICD-10-CM | POA: Diagnosis not present

## 2013-08-02 DIAGNOSIS — K222 Esophageal obstruction: Secondary | ICD-10-CM | POA: Diagnosis not present

## 2013-08-06 DIAGNOSIS — B351 Tinea unguium: Secondary | ICD-10-CM | POA: Diagnosis not present

## 2013-08-06 DIAGNOSIS — E1159 Type 2 diabetes mellitus with other circulatory complications: Secondary | ICD-10-CM | POA: Diagnosis not present

## 2013-08-06 DIAGNOSIS — L851 Acquired keratosis [keratoderma] palmaris et plantaris: Secondary | ICD-10-CM | POA: Diagnosis not present

## 2013-08-14 DIAGNOSIS — Z888 Allergy status to other drugs, medicaments and biological substances status: Secondary | ICD-10-CM | POA: Diagnosis not present

## 2013-08-14 DIAGNOSIS — Z8673 Personal history of transient ischemic attack (TIA), and cerebral infarction without residual deficits: Secondary | ICD-10-CM | POA: Diagnosis not present

## 2013-08-14 DIAGNOSIS — M109 Gout, unspecified: Secondary | ICD-10-CM | POA: Diagnosis not present

## 2013-08-14 DIAGNOSIS — Z79899 Other long term (current) drug therapy: Secondary | ICD-10-CM | POA: Diagnosis not present

## 2013-08-14 DIAGNOSIS — G609 Hereditary and idiopathic neuropathy, unspecified: Secondary | ICD-10-CM | POA: Diagnosis not present

## 2013-08-14 DIAGNOSIS — E119 Type 2 diabetes mellitus without complications: Secondary | ICD-10-CM | POA: Diagnosis not present

## 2013-08-14 DIAGNOSIS — K5730 Diverticulosis of large intestine without perforation or abscess without bleeding: Secondary | ICD-10-CM | POA: Diagnosis not present

## 2013-08-14 DIAGNOSIS — N183 Chronic kidney disease, stage 3 unspecified: Secondary | ICD-10-CM | POA: Diagnosis not present

## 2013-08-14 DIAGNOSIS — R091 Pleurisy: Secondary | ICD-10-CM | POA: Diagnosis not present

## 2013-08-14 DIAGNOSIS — N400 Enlarged prostate without lower urinary tract symptoms: Secondary | ICD-10-CM | POA: Diagnosis not present

## 2013-08-14 DIAGNOSIS — I129 Hypertensive chronic kidney disease with stage 1 through stage 4 chronic kidney disease, or unspecified chronic kidney disease: Secondary | ICD-10-CM | POA: Diagnosis not present

## 2013-08-14 DIAGNOSIS — K5900 Constipation, unspecified: Secondary | ICD-10-CM | POA: Diagnosis not present

## 2013-08-15 ENCOUNTER — Ambulatory Visit: Payer: Medicare Other | Admitting: Internal Medicine

## 2013-08-15 DIAGNOSIS — N402 Nodular prostate without lower urinary tract symptoms: Secondary | ICD-10-CM | POA: Diagnosis not present

## 2013-08-15 DIAGNOSIS — N401 Enlarged prostate with lower urinary tract symptoms: Secondary | ICD-10-CM | POA: Diagnosis not present

## 2013-08-15 DIAGNOSIS — Z0289 Encounter for other administrative examinations: Secondary | ICD-10-CM

## 2013-08-15 DIAGNOSIS — R3989 Other symptoms and signs involving the genitourinary system: Secondary | ICD-10-CM | POA: Diagnosis not present

## 2013-08-16 DIAGNOSIS — I129 Hypertensive chronic kidney disease with stage 1 through stage 4 chronic kidney disease, or unspecified chronic kidney disease: Secondary | ICD-10-CM | POA: Diagnosis not present

## 2013-08-16 DIAGNOSIS — K222 Esophageal obstruction: Secondary | ICD-10-CM | POA: Diagnosis not present

## 2013-08-16 DIAGNOSIS — Z79899 Other long term (current) drug therapy: Secondary | ICD-10-CM | POA: Diagnosis not present

## 2013-08-16 DIAGNOSIS — I739 Peripheral vascular disease, unspecified: Secondary | ICD-10-CM | POA: Diagnosis not present

## 2013-08-16 DIAGNOSIS — Z8673 Personal history of transient ischemic attack (TIA), and cerebral infarction without residual deficits: Secondary | ICD-10-CM | POA: Diagnosis not present

## 2013-08-16 DIAGNOSIS — K449 Diaphragmatic hernia without obstruction or gangrene: Secondary | ICD-10-CM | POA: Diagnosis not present

## 2013-08-16 DIAGNOSIS — E119 Type 2 diabetes mellitus without complications: Secondary | ICD-10-CM | POA: Diagnosis not present

## 2013-08-16 DIAGNOSIS — G609 Hereditary and idiopathic neuropathy, unspecified: Secondary | ICD-10-CM | POA: Diagnosis not present

## 2013-08-16 DIAGNOSIS — N183 Chronic kidney disease, stage 3 unspecified: Secondary | ICD-10-CM | POA: Diagnosis not present

## 2013-08-16 DIAGNOSIS — Z888 Allergy status to other drugs, medicaments and biological substances status: Secondary | ICD-10-CM | POA: Diagnosis not present

## 2013-08-16 DIAGNOSIS — Z7902 Long term (current) use of antithrombotics/antiplatelets: Secondary | ICD-10-CM | POA: Diagnosis not present

## 2013-09-06 DIAGNOSIS — F29 Unspecified psychosis not due to a substance or known physiological condition: Secondary | ICD-10-CM | POA: Diagnosis not present

## 2013-09-06 DIAGNOSIS — F0390 Unspecified dementia without behavioral disturbance: Secondary | ICD-10-CM | POA: Diagnosis present

## 2013-09-06 DIAGNOSIS — F489 Nonpsychotic mental disorder, unspecified: Secondary | ICD-10-CM | POA: Diagnosis not present

## 2013-09-06 DIAGNOSIS — R000 Tachycardia, unspecified: Secondary | ICD-10-CM | POA: Diagnosis not present

## 2013-09-06 DIAGNOSIS — Z87891 Personal history of nicotine dependence: Secondary | ICD-10-CM | POA: Diagnosis not present

## 2013-09-06 DIAGNOSIS — F068 Other specified mental disorders due to known physiological condition: Secondary | ICD-10-CM | POA: Diagnosis not present

## 2013-09-06 DIAGNOSIS — I10 Essential (primary) hypertension: Secondary | ICD-10-CM | POA: Diagnosis not present

## 2013-09-06 DIAGNOSIS — R4182 Altered mental status, unspecified: Secondary | ICD-10-CM | POA: Diagnosis not present

## 2013-09-06 DIAGNOSIS — F322 Major depressive disorder, single episode, severe without psychotic features: Secondary | ICD-10-CM | POA: Diagnosis present

## 2013-09-13 DIAGNOSIS — I10 Essential (primary) hypertension: Secondary | ICD-10-CM | POA: Diagnosis not present

## 2013-09-13 DIAGNOSIS — N400 Enlarged prostate without lower urinary tract symptoms: Secondary | ICD-10-CM | POA: Diagnosis not present

## 2013-09-13 DIAGNOSIS — R413 Other amnesia: Secondary | ICD-10-CM | POA: Diagnosis not present

## 2013-09-13 DIAGNOSIS — H9190 Unspecified hearing loss, unspecified ear: Secondary | ICD-10-CM | POA: Diagnosis not present

## 2013-10-08 DIAGNOSIS — E1159 Type 2 diabetes mellitus with other circulatory complications: Secondary | ICD-10-CM | POA: Diagnosis not present

## 2013-10-08 DIAGNOSIS — B351 Tinea unguium: Secondary | ICD-10-CM | POA: Diagnosis not present

## 2013-10-08 DIAGNOSIS — L851 Acquired keratosis [keratoderma] palmaris et plantaris: Secondary | ICD-10-CM | POA: Diagnosis not present

## 2013-11-01 DIAGNOSIS — H9110 Presbycusis, unspecified ear: Secondary | ICD-10-CM | POA: Diagnosis not present

## 2013-11-01 DIAGNOSIS — N183 Chronic kidney disease, stage 3 unspecified: Secondary | ICD-10-CM | POA: Diagnosis not present

## 2013-11-01 DIAGNOSIS — R9389 Abnormal findings on diagnostic imaging of other specified body structures: Secondary | ICD-10-CM | POA: Diagnosis not present

## 2013-11-01 DIAGNOSIS — E1129 Type 2 diabetes mellitus with other diabetic kidney complication: Secondary | ICD-10-CM | POA: Diagnosis not present

## 2013-11-01 DIAGNOSIS — R21 Rash and other nonspecific skin eruption: Secondary | ICD-10-CM | POA: Diagnosis not present

## 2013-12-13 ENCOUNTER — Other Ambulatory Visit: Payer: Self-pay | Admitting: Geriatric Medicine

## 2013-12-13 ENCOUNTER — Ambulatory Visit
Admission: RE | Admit: 2013-12-13 | Discharge: 2013-12-13 | Disposition: A | Discharge status: Home / Self Care | Payer: Medicare Other | Source: Ambulatory Visit | Attending: Geriatric Medicine | Admitting: Geriatric Medicine

## 2013-12-13 DIAGNOSIS — R9389 Abnormal findings on diagnostic imaging of other specified body structures: Secondary | ICD-10-CM

## 2013-12-13 DIAGNOSIS — J438 Other emphysema: Secondary | ICD-10-CM | POA: Diagnosis not present

## 2013-12-13 DIAGNOSIS — E1129 Type 2 diabetes mellitus with other diabetic kidney complication: Secondary | ICD-10-CM | POA: Diagnosis not present

## 2013-12-13 DIAGNOSIS — R011 Cardiac murmur, unspecified: Secondary | ICD-10-CM | POA: Diagnosis not present

## 2013-12-13 DIAGNOSIS — Z79899 Other long term (current) drug therapy: Secondary | ICD-10-CM | POA: Diagnosis not present

## 2013-12-13 DIAGNOSIS — N183 Chronic kidney disease, stage 3 unspecified: Secondary | ICD-10-CM | POA: Diagnosis not present

## 2013-12-28 ENCOUNTER — Other Ambulatory Visit (HOSPITAL_COMMUNITY): Payer: Self-pay | Admitting: Geriatric Medicine

## 2013-12-28 ENCOUNTER — Ambulatory Visit (HOSPITAL_COMMUNITY): Payer: Medicare Other | Attending: Geriatric Medicine | Admitting: Cardiology

## 2013-12-28 DIAGNOSIS — Z8673 Personal history of transient ischemic attack (TIA), and cerebral infarction without residual deficits: Secondary | ICD-10-CM | POA: Insufficient documentation

## 2013-12-28 DIAGNOSIS — E119 Type 2 diabetes mellitus without complications: Secondary | ICD-10-CM | POA: Insufficient documentation

## 2013-12-28 DIAGNOSIS — E785 Hyperlipidemia, unspecified: Secondary | ICD-10-CM | POA: Insufficient documentation

## 2013-12-28 DIAGNOSIS — I10 Essential (primary) hypertension: Secondary | ICD-10-CM | POA: Insufficient documentation

## 2013-12-28 DIAGNOSIS — R011 Cardiac murmur, unspecified: Secondary | ICD-10-CM | POA: Insufficient documentation

## 2013-12-28 NOTE — Progress Notes (Signed)
Echo performed. 

## 2014-01-04 DIAGNOSIS — H353190 Nonexudative age-related macular degeneration, unspecified eye, stage unspecified: Secondary | ICD-10-CM | POA: Diagnosis not present

## 2014-01-04 DIAGNOSIS — Z961 Presence of intraocular lens: Secondary | ICD-10-CM | POA: Diagnosis not present

## 2014-01-04 DIAGNOSIS — H40059 Ocular hypertension, unspecified eye: Secondary | ICD-10-CM | POA: Diagnosis not present

## 2014-03-06 DIAGNOSIS — R3989 Other symptoms and signs involving the genitourinary system: Secondary | ICD-10-CM | POA: Diagnosis not present

## 2014-03-14 DIAGNOSIS — N400 Enlarged prostate without lower urinary tract symptoms: Secondary | ICD-10-CM | POA: Diagnosis not present

## 2014-03-14 DIAGNOSIS — N183 Chronic kidney disease, stage 3 unspecified: Secondary | ICD-10-CM | POA: Diagnosis not present

## 2014-03-14 DIAGNOSIS — K5900 Constipation, unspecified: Secondary | ICD-10-CM | POA: Diagnosis not present

## 2014-03-14 DIAGNOSIS — E1129 Type 2 diabetes mellitus with other diabetic kidney complication: Secondary | ICD-10-CM | POA: Diagnosis not present

## 2014-03-14 DIAGNOSIS — K469 Unspecified abdominal hernia without obstruction or gangrene: Secondary | ICD-10-CM | POA: Diagnosis not present

## 2014-03-25 DIAGNOSIS — N401 Enlarged prostate with lower urinary tract symptoms: Secondary | ICD-10-CM | POA: Diagnosis not present

## 2014-03-25 DIAGNOSIS — R3989 Other symptoms and signs involving the genitourinary system: Secondary | ICD-10-CM | POA: Diagnosis not present

## 2014-04-15 DIAGNOSIS — N401 Enlarged prostate with lower urinary tract symptoms: Secondary | ICD-10-CM | POA: Diagnosis not present

## 2014-04-15 DIAGNOSIS — R3989 Other symptoms and signs involving the genitourinary system: Secondary | ICD-10-CM | POA: Diagnosis not present

## 2014-04-15 DIAGNOSIS — N139 Obstructive and reflux uropathy, unspecified: Secondary | ICD-10-CM | POA: Diagnosis not present

## 2014-04-15 DIAGNOSIS — N138 Other obstructive and reflux uropathy: Secondary | ICD-10-CM | POA: Diagnosis not present

## 2014-06-27 DIAGNOSIS — E119 Type 2 diabetes mellitus without complications: Secondary | ICD-10-CM | POA: Diagnosis not present

## 2014-07-04 DIAGNOSIS — E1142 Type 2 diabetes mellitus with diabetic polyneuropathy: Secondary | ICD-10-CM | POA: Diagnosis not present

## 2014-07-04 DIAGNOSIS — N400 Enlarged prostate without lower urinary tract symptoms: Secondary | ICD-10-CM | POA: Diagnosis not present

## 2014-07-04 DIAGNOSIS — Z1389 Encounter for screening for other disorder: Secondary | ICD-10-CM | POA: Diagnosis not present

## 2014-07-04 DIAGNOSIS — N183 Chronic kidney disease, stage 3 (moderate): Secondary | ICD-10-CM | POA: Diagnosis not present

## 2014-07-04 DIAGNOSIS — E1121 Type 2 diabetes mellitus with diabetic nephropathy: Secondary | ICD-10-CM | POA: Diagnosis not present

## 2014-07-15 DIAGNOSIS — N401 Enlarged prostate with lower urinary tract symptoms: Secondary | ICD-10-CM | POA: Diagnosis not present

## 2014-07-15 DIAGNOSIS — R350 Frequency of micturition: Secondary | ICD-10-CM | POA: Diagnosis not present

## 2014-09-13 DIAGNOSIS — K219 Gastro-esophageal reflux disease without esophagitis: Secondary | ICD-10-CM | POA: Diagnosis not present

## 2014-10-24 DIAGNOSIS — K219 Gastro-esophageal reflux disease without esophagitis: Secondary | ICD-10-CM | POA: Diagnosis not present

## 2014-10-24 DIAGNOSIS — E1140 Type 2 diabetes mellitus with diabetic neuropathy, unspecified: Secondary | ICD-10-CM | POA: Diagnosis not present

## 2014-12-12 DIAGNOSIS — N183 Chronic kidney disease, stage 3 (moderate): Secondary | ICD-10-CM | POA: Diagnosis not present

## 2014-12-12 DIAGNOSIS — E1121 Type 2 diabetes mellitus with diabetic nephropathy: Secondary | ICD-10-CM | POA: Diagnosis not present

## 2014-12-12 DIAGNOSIS — Z79899 Other long term (current) drug therapy: Secondary | ICD-10-CM | POA: Diagnosis not present

## 2014-12-12 DIAGNOSIS — E1142 Type 2 diabetes mellitus with diabetic polyneuropathy: Secondary | ICD-10-CM | POA: Diagnosis not present

## 2015-01-16 DIAGNOSIS — L219 Seborrheic dermatitis, unspecified: Secondary | ICD-10-CM | POA: Diagnosis not present

## 2015-01-16 DIAGNOSIS — L570 Actinic keratosis: Secondary | ICD-10-CM | POA: Diagnosis not present

## 2015-01-16 DIAGNOSIS — L820 Inflamed seborrheic keratosis: Secondary | ICD-10-CM | POA: Diagnosis not present

## 2015-02-18 DIAGNOSIS — Z23 Encounter for immunization: Secondary | ICD-10-CM | POA: Diagnosis not present

## 2015-02-18 DIAGNOSIS — R1310 Dysphagia, unspecified: Secondary | ICD-10-CM | POA: Diagnosis not present

## 2015-02-18 DIAGNOSIS — K219 Gastro-esophageal reflux disease without esophagitis: Secondary | ICD-10-CM | POA: Diagnosis not present

## 2015-02-18 DIAGNOSIS — K5900 Constipation, unspecified: Secondary | ICD-10-CM | POA: Diagnosis not present

## 2015-02-18 DIAGNOSIS — N400 Enlarged prostate without lower urinary tract symptoms: Secondary | ICD-10-CM | POA: Diagnosis not present

## 2015-03-20 DIAGNOSIS — H903 Sensorineural hearing loss, bilateral: Secondary | ICD-10-CM | POA: Diagnosis not present

## 2015-04-08 DIAGNOSIS — M6281 Muscle weakness (generalized): Secondary | ICD-10-CM | POA: Diagnosis not present

## 2015-04-08 DIAGNOSIS — M1990 Unspecified osteoarthritis, unspecified site: Secondary | ICD-10-CM | POA: Diagnosis not present

## 2015-04-16 DIAGNOSIS — M1990 Unspecified osteoarthritis, unspecified site: Secondary | ICD-10-CM | POA: Diagnosis not present

## 2015-04-16 DIAGNOSIS — M6281 Muscle weakness (generalized): Secondary | ICD-10-CM | POA: Diagnosis not present

## 2015-04-28 ENCOUNTER — Ambulatory Visit: Payer: PRIVATE HEALTH INSURANCE | Admitting: Gastroenterology

## 2015-05-08 DIAGNOSIS — L2084 Intrinsic (allergic) eczema: Secondary | ICD-10-CM | POA: Diagnosis not present

## 2015-05-15 DIAGNOSIS — M1990 Unspecified osteoarthritis, unspecified site: Secondary | ICD-10-CM | POA: Diagnosis not present

## 2015-05-15 DIAGNOSIS — M6281 Muscle weakness (generalized): Secondary | ICD-10-CM | POA: Diagnosis not present

## 2015-05-22 DIAGNOSIS — E1121 Type 2 diabetes mellitus with diabetic nephropathy: Secondary | ICD-10-CM | POA: Diagnosis not present

## 2015-05-22 DIAGNOSIS — N183 Chronic kidney disease, stage 3 (moderate): Secondary | ICD-10-CM | POA: Diagnosis not present

## 2015-05-22 DIAGNOSIS — E1142 Type 2 diabetes mellitus with diabetic polyneuropathy: Secondary | ICD-10-CM | POA: Diagnosis not present

## 2015-05-22 DIAGNOSIS — Z0000 Encounter for general adult medical examination without abnormal findings: Secondary | ICD-10-CM | POA: Diagnosis not present

## 2015-05-22 DIAGNOSIS — R109 Unspecified abdominal pain: Secondary | ICD-10-CM | POA: Diagnosis not present

## 2015-05-22 DIAGNOSIS — Z1389 Encounter for screening for other disorder: Secondary | ICD-10-CM | POA: Diagnosis not present

## 2015-05-22 DIAGNOSIS — Z79899 Other long term (current) drug therapy: Secondary | ICD-10-CM | POA: Diagnosis not present

## 2015-05-23 DIAGNOSIS — M6281 Muscle weakness (generalized): Secondary | ICD-10-CM | POA: Diagnosis not present

## 2015-05-23 DIAGNOSIS — M1990 Unspecified osteoarthritis, unspecified site: Secondary | ICD-10-CM | POA: Diagnosis not present

## 2015-07-14 ENCOUNTER — Ambulatory Visit (INDEPENDENT_AMBULATORY_CARE_PROVIDER_SITE_OTHER): Payer: Medicare Other

## 2015-07-14 ENCOUNTER — Emergency Department (HOSPITAL_COMMUNITY): Payer: Medicare Other

## 2015-07-14 ENCOUNTER — Encounter (HOSPITAL_COMMUNITY): Payer: Self-pay | Admitting: Emergency Medicine

## 2015-07-14 ENCOUNTER — Ambulatory Visit (INDEPENDENT_AMBULATORY_CARE_PROVIDER_SITE_OTHER): Payer: Medicare Other | Admitting: Emergency Medicine

## 2015-07-14 ENCOUNTER — Emergency Department (HOSPITAL_COMMUNITY)
Admission: EM | Admit: 2015-07-14 | Discharge: 2015-07-14 | Disposition: A | Discharge status: Home / Self Care | Payer: Medicare Other | Attending: Emergency Medicine | Admitting: Emergency Medicine

## 2015-07-14 VITALS — BP 108/74 | HR 102 | Temp 98.1°F | Resp 17 | Ht 66.0 in | Wt 140.0 lb

## 2015-07-14 DIAGNOSIS — Z791 Long term (current) use of non-steroidal anti-inflammatories (NSAID): Secondary | ICD-10-CM | POA: Diagnosis not present

## 2015-07-14 DIAGNOSIS — K5900 Constipation, unspecified: Secondary | ICD-10-CM | POA: Insufficient documentation

## 2015-07-14 DIAGNOSIS — Z79899 Other long term (current) drug therapy: Secondary | ICD-10-CM | POA: Insufficient documentation

## 2015-07-14 DIAGNOSIS — Z8673 Personal history of transient ischemic attack (TIA), and cerebral infarction without residual deficits: Secondary | ICD-10-CM | POA: Diagnosis not present

## 2015-07-14 DIAGNOSIS — K219 Gastro-esophageal reflux disease without esophagitis: Secondary | ICD-10-CM | POA: Diagnosis not present

## 2015-07-14 DIAGNOSIS — E119 Type 2 diabetes mellitus without complications: Secondary | ICD-10-CM | POA: Diagnosis not present

## 2015-07-14 DIAGNOSIS — I10 Essential (primary) hypertension: Secondary | ICD-10-CM | POA: Insufficient documentation

## 2015-07-14 DIAGNOSIS — R1084 Generalized abdominal pain: Secondary | ICD-10-CM | POA: Diagnosis not present

## 2015-07-14 DIAGNOSIS — Z7984 Long term (current) use of oral hypoglycemic drugs: Secondary | ICD-10-CM | POA: Diagnosis not present

## 2015-07-14 DIAGNOSIS — Z87448 Personal history of other diseases of urinary system: Secondary | ICD-10-CM | POA: Insufficient documentation

## 2015-07-14 DIAGNOSIS — R109 Unspecified abdominal pain: Secondary | ICD-10-CM | POA: Diagnosis present

## 2015-07-14 LAB — POCT CBC
Granulocyte percent: 62.2 %G (ref 37–80)
HEMATOCRIT: 45 % (ref 43.5–53.7)
Hemoglobin: 15 g/dL (ref 14.1–18.1)
LYMPH, POC: 4.6 — AB (ref 0.6–3.4)
MCH, POC: 29.2 pg (ref 27–31.2)
MCHC: 33.3 g/dL (ref 31.8–35.4)
MCV: 87.5 fL (ref 80–97)
MID (CBC): 0.5 (ref 0–0.9)
MPV: 7.9 fL (ref 0–99.8)
POC GRANULOCYTE: 8.4 — AB (ref 2–6.9)
POC LYMPH %: 34.1 % (ref 10–50)
POC MID %: 3.7 % (ref 0–12)
Platelet Count, POC: 102 10*3/uL — AB (ref 142–424)
RBC: 5.15 M/uL (ref 4.69–6.13)
RDW, POC: 14.2 %
WBC: 13.5 10*3/uL — AB (ref 4.6–10.2)

## 2015-07-14 LAB — COMPLETE METABOLIC PANEL WITH GFR
ALBUMIN: 4.2 g/dL (ref 3.6–5.1)
ALK PHOS: 56 U/L (ref 40–115)
ALT: 10 U/L (ref 9–46)
AST: 15 U/L (ref 10–35)
BILIRUBIN TOTAL: 1 mg/dL (ref 0.2–1.2)
BUN: 18 mg/dL (ref 7–25)
CO2: 24 mmol/L (ref 20–31)
CREATININE: 1.21 mg/dL — AB (ref 0.70–1.11)
Calcium: 9.4 mg/dL (ref 8.6–10.3)
Chloride: 103 mmol/L (ref 98–110)
GFR, EST AFRICAN AMERICAN: 57 mL/min — AB (ref >=60)
GFR, EST NON AFRICAN AMERICAN: 50 mL/min — AB (ref >=60)
GLUCOSE: 143 mg/dL — AB (ref 65–99)
Potassium: 4.3 mmol/L (ref 3.5–5.3)
SODIUM: 139 mmol/L (ref 135–146)
Total Protein: 6.8 g/dL (ref 6.1–8.1)

## 2015-07-14 LAB — GLUCOSE, POCT (MANUAL RESULT ENTRY): POC Glucose: 163 mg/dl — AB (ref 70–99)

## 2015-07-14 LAB — COMPREHENSIVE METABOLIC PANEL
ALBUMIN: 4.5 g/dL (ref 3.5–5.0)
ALK PHOS: 60 U/L (ref 38–126)
ALT: 12 U/L — ABNORMAL LOW (ref 17–63)
ANION GAP: 12 (ref 5–15)
AST: 19 U/L (ref 15–41)
BILIRUBIN TOTAL: 1.3 mg/dL — AB (ref 0.3–1.2)
BUN: 18 mg/dL (ref 6–20)
CALCIUM: 9.5 mg/dL (ref 8.9–10.3)
CO2: 24 mmol/L (ref 22–32)
Chloride: 103 mmol/L (ref 101–111)
Creatinine, Ser: 1.33 mg/dL — ABNORMAL HIGH (ref 0.61–1.24)
GFR calc non Af Amer: 43 mL/min — ABNORMAL LOW (ref >60)
GFR, EST AFRICAN AMERICAN: 50 mL/min — AB (ref >60)
GLUCOSE: 162 mg/dL — AB (ref 65–99)
POTASSIUM: 3.6 mmol/L (ref 3.5–5.1)
Sodium: 139 mmol/L (ref 135–145)
TOTAL PROTEIN: 7.1 g/dL (ref 6.5–8.1)

## 2015-07-14 LAB — POCT URINALYSIS DIP (MANUAL ENTRY)
Bilirubin, UA: NEGATIVE
Glucose, UA: NEGATIVE
Ketones, POC UA: NEGATIVE
Leukocytes, UA: NEGATIVE
NITRITE UA: NEGATIVE
Protein Ur, POC: 30 — AB
RBC UA: NEGATIVE
SPEC GRAV UA: 1.02
UROBILINOGEN UA: 0.2
pH, UA: 5.5

## 2015-07-14 LAB — TSH: TSH: 2.112 u[IU]/mL (ref 0.350–4.500)

## 2015-07-14 LAB — LIPASE, BLOOD: LIPASE: 31 U/L (ref 11–51)

## 2015-07-14 MED ORDER — IOHEXOL 300 MG/ML  SOLN
25.0000 mL | Freq: Once | INTRAMUSCULAR | Status: AC | PRN
  Administered 2015-07-14: 50 mL via ORAL

## 2015-07-14 MED ORDER — IOHEXOL 300 MG/ML  SOLN
80.0000 mL | Freq: Once | INTRAMUSCULAR | Status: AC | PRN
  Administered 2015-07-14: 80 mL via INTRAVENOUS

## 2015-07-14 MED ORDER — SODIUM CHLORIDE 0.9 % IV SOLN
INTRAVENOUS | Status: DC
  Administered 2015-07-14: 125 mL/h via INTRAVENOUS

## 2015-07-14 NOTE — ED Notes (Signed)
Pt sent by PCP for evaluation of elevated WBC and constipation; last BM a week ago; denies pain.

## 2015-07-14 NOTE — ED Provider Notes (Signed)
CSN: [id number]     Arrival date & time 07/14/15  1521 History   First MD Initiated Contact with Patient 07/14/15 1655     Chief Complaint  Patient presents with  . Constipation     (Consider location/radiation/quality/duration/timing/severity/associated sxs/prior Treatment) HPI Comments: Patient here complaining of constipation 1 week with associated abdominal cramping. Went to urgent care today and was found to have a white blood cell, 12.1. He denies any fever or chills. Denies any urinary symptoms. Urinalysis reviewed also shows proteinuria but no other findings for infection. Describes hard stools without blood per rectum. Has been using his Colace without relief.  Patient is a 81 y.o. male presenting with constipation. The history is provided by the patient and the spouse.  Constipation   Past Medical History  Diagnosis Date  . Diabetes mellitus   . Diverticulosis   . Hypertension   . PVD (peripheral vascular disease) ([place])   . Renal insufficiency   . Stroke ([place])   . Hyperlipidemia   . GERD (gastroesophageal reflux disease)   . Constipation    Past Surgical History  Procedure Laterality Date  . Kidney stone surgery    . Vertebral artery stent    . Hernia repair      right inguinal  . Left groin exploration    . Esophagogastroduodenoscopy N/A 01/08/2013    Procedure: ESOPHAGOGASTRODUODENOSCOPY (EGD);  Surgeon: Ladene Artist, MD;  Location: [facility];  Service: Endoscopy;  Laterality: N/A;   Family History  Problem Relation Age of Onset  . Diabetes Mother   . Heart disease Mother   . Colon cancer Neg Hx    Social History  Substance Use Topics  . Smoking status: Never Smoker   . Smokeless tobacco: Never Used  . Alcohol Use: No    Review of Systems  Gastrointestinal: Positive for constipation.  All other systems reviewed and are negative.     Allergies  Prednisone  Home Medications   Prior to Admission medications   Medication Sig Start Date  End Date Taking? Authorizing Provider  Sennosides (EX-LAX PO) Take 1 tablet by mouth daily as needed (constipation).   Yes Historical Provider, MD  bisoprolol-hydrochlorothiazide (ZIAC) 2.5-6.25 MG per tablet Take 1 tablet by mouth daily. 11/23/12 11/23/13  Ricard Dillon, MD  clopidogrel (PLAVIX) 75 MG tablet Take 1 tablet (75 mg total) by mouth daily. Patient not taking: Reported on 07/14/2015 07/06/12   Ricard Dillon, MD  diclofenac sodium (VOLTAREN) 1 % GEL Apply topically 4 (four) times daily.    Historical Provider, MD  docusate sodium (COLACE) 100 MG capsule Take 100 mg by mouth daily.    Historical Provider, MD  famotidine (PEPCID AC) 10 MG chewable tablet Chew 1 tablet (10 mg total) by mouth 2 (two) times daily. Patient not taking: Reported on 07/14/2015 07/06/13   Ricard Dillon, MD  glyBURIDE (DIABETA) 2.5 MG tablet Take 2.5 mg by mouth daily with breakfast. Reported on 07/14/2015    Historical Provider, MD  metFORMIN (GLUMETZA) 500 MG (MOD) 24 hr tablet Take 500 mg by mouth daily with breakfast.    Historical Provider, MD  Multiple Vitamins-Minerals (ICAPS MV PO) Take by mouth 2 (two) times daily. Reported on 07/14/2015    Historical Provider, MD  omeprazole (PRILOSEC) 20 MG capsule Take 40 mg by mouth daily.    Historical Provider, MD  Tamsulosin HCl (FLOMAX) 0.4 MG CAPS Take 2 capsules (0.8 mg total) by mouth daily. Patient not taking: Reported on 07/14/2015 07/25/12  Ricard Dillon, MD   BP 163/101 mmHg  Pulse 96  Temp(Src) 97.7 F (36.5 C) (Oral)  Resp 18  SpO2 98% Physical Exam  Constitutional: He is oriented to person, place, and time. He appears well-developed and well-nourished.  Non-toxic appearance. No distress.  HENT:  Head: Normocephalic and atraumatic.  Eyes: Conjunctivae, EOM and lids are normal. Pupils are equal, round, and reactive to light.  Neck: Normal range of motion. Neck supple. No tracheal deviation present. No thyroid mass present.  Cardiovascular: Normal  rate, regular rhythm and normal heart sounds.  Exam reveals no gallop.   No murmur heard. Pulmonary/Chest: Effort normal and breath sounds normal. No stridor. No respiratory distress. He has no decreased breath sounds. He has no wheezes. He has no rhonchi. He has no rales.  Abdominal: Soft. Normal appearance and bowel sounds are normal. He exhibits no distension. There is no tenderness. There is no rigidity, no rebound, no guarding and no CVA tenderness.  Musculoskeletal: Normal range of motion. He exhibits no edema or tenderness.  Neurological: He is alert and oriented to person, place, and time. He has normal strength. No cranial nerve deficit or sensory deficit. GCS eye subscore is 4. GCS verbal subscore is 5. GCS motor subscore is 6.  Skin: Skin is warm and dry. No abrasion and no rash noted.  Psychiatric: He has a normal mood and affect. His speech is normal and behavior is normal.  Nursing note and vitals reviewed.   ED Course  Procedures (including critical care time) Labs Review Labs Reviewed  COMPREHENSIVE METABOLIC PANEL - Abnormal; Notable for the following:    Glucose, Bld 162 (*)    Creatinine, Ser 1.33 (*)    ALT 12 (*)    Total Bilirubin 1.3 (*)    GFR calc non Af Amer 43 (*)    GFR calc Af Amer 50 (*)    All other components within normal limits  LIPASE, BLOOD    Imaging Review Ct Abdomen Pelvis W Contrast  07/14/2015  CLINICAL DATA:  Constipation and abdominal. EXAM: CT ABDOMEN AND PELVIS WITH CONTRAST TECHNIQUE: Multidetector CT imaging of the abdomen and pelvis was performed using the standard protocol following bolus administration of intravenous contrast. Oral contrast was also administered. CONTRAST:  76mL OMNIPAQUE IOHEXOL 300 MG/ML SOLN, 8mL OMNIPAQUE IOHEXOL 300 MG/ML SOLN COMPARISON:  November 05, 2011 FINDINGS: Lower chest: There is a stable 7 mm nodular opacity in the lateral segment right middle lobe. There are again noted calcified pleural plaques consistent  with prior asbestos exposure. There is mild bibasilar lung scarring. No lung base edema or consolidation. There are multiple foci of coronary artery calcification. There is a small hiatal hernia. There is evidence of old rib trauma in the anterior left lung base region, stable. Hepatobiliary: No focal liver lesions are identified. The gallbladder wall is not thickened. There is no biliary duct dilatation. Pancreas: There is no pancreatic mass or inflammatory focus. Spleen: No splenic lesions are identified. Adrenals/Urinary Tract: No adrenal lesions are identified on the right. There is a 1.0 x 0.7 cm adenoma in the medial left adrenal gland. There are renal cysts arising from both kidneys. The largest renal cyst on the right is located anteriorly in the mid kidney region measuring 5.1 x 4.1 cm. The largest cyst on the left arises laterally from the mid kidney region, measuring 5.1 x 5.1 cm. There is an extrarenal pelvis in each kidney. There is no hydronephrosis on either side. There is no  renal or ureteral calculus on either side. The urinary bladder is midline with wall thickness within normal limits. Stomach/Bowel: Rectum is mildly distended with air. There are multiple sigmoid diverticula without diverticulitis. There is no bowel wall or mesenteric thickening. There is no bowel obstruction. No free air or portal venous air. Vascular/Lymphatic: There is atherosclerotic change throughout the aorta without aneurysm. Atherosclerotic calcification is also noted in each common iliac artery. There is mild atherosclerotic change at the origins of the celiac and superior mesenteric artery. There is no demonstrable adenopathy in the abdomen or pelvis. Reproductive: Prostate appears mildly enlarged. No pelvic mass or pelvic fluid collection. Seminal vesicles appear unremarkable peer Other: There is no periappendiceal region inflammation. No abscess or ascites in the abdomen or pelvis. There is fat in each inguinal ring.  The right testis is located in the inferior right inguinal canal. Musculoskeletal: There is degenerative change in the lumbar spine. There are no blastic or lytic bone lesions. No intramuscular or abdominal wall lesions are identified. IMPRESSION: No bowel obstruction. No abscess. No periappendiceal region inflammation. There are multiple sigmoid diverticula without diverticulitis. Rectum is mildly distended with air. No renal or ureteral calculus. No hydronephrosis. Multiple renal cysts are noted bilaterally. Prostate is prominent.  Advise correlation with PSA. The right testis is in the inferior right inguinal canal region. Small benign adenoma left adrenal gland. Evidence of prior asbestos exposure. Stable nodular opacity measuring 7 mm right middle lobe. Stability of this nodular lesion since 2013 is consistent with benign etiology. Small hiatal hernia. Foci of coronary artery calcification. Electronically Signed   By: Lowella Grip III M.D.   On: 07/14/2015 19:05   Dg Abd Acute W/chest  07/14/2015  CLINICAL DATA:  One week of constipation EXAM: DG ABDOMEN ACUTE W/ 1V CHEST COMPARISON:  Chest x-ray of Dec 13, 2013 and abdominal series of May 06, 2011. FINDINGS: Chest x-ray: The lungs are well-expanded. There is no focal infiltrate. There is are stable calcified pleural plaques overlying the peripherally of the left mid hemi thorax and both hemidiaphragms. The heart and pulmonary vascularity are normal. There is mild tortuosity of the descending thoracic aorta. There is no pleural effusion. Abdominal supine and upright views: The colonic stool burden is normal. The bowel gas pattern is normal. There are no abnormal soft tissue calcifications. There is mild reverse S-shaped thoracolumbar curvature. IMPRESSION: 1. There is no acute cardiopulmonary abnormality. There is are stable changes consistent with previous asbestos exposure. 2. The colonic stool burden is normal. No acute intra-abdominal  abnormality is observed. Electronically Signed   By: David  Martinique M.D.   On: 07/14/2015 13:39   I have personally reviewed and evaluated these images and lab results as part of my medical decision-making.   EKG Interpretation None      MDM   Final diagnoses:  None    Patient's abdominal CT results noted and without acute findings. Patient stable for discharge    Lacretia Leigh, MD 07/14/15 [phone]

## 2015-07-14 NOTE — ED Notes (Addendum)
12 and go was called patient ride home

## 2015-07-14 NOTE — Patient Instructions (Signed)
Please go to [facility] to be evaluated for your elevated white count and constipation  in the Emergency Department

## 2015-07-14 NOTE — Discharge Instructions (Signed)

## 2015-07-14 NOTE — ED Notes (Signed)
Patient transported to CT 

## 2015-07-14 NOTE — Progress Notes (Addendum)
Patient ID: Don Montoya, male   DOB: 1914-08-11, 81 y.o.   MRN: [id number]    By signing my name below, I, Essence Howell, attest that this documentation has been prepared under the direction and in the presence of Darlyne Russian, MD Electronically Signed: Ladene Artist, ED Scribe 07/14/2015 at 12:43 PM.  Chief Complaint:  Chief Complaint  Patient presents with  . Constipation    x2-3 days    HPI: Don Montoya is a 81 y.o. male, with a h/o diverticulosis, who reports to [facility] today complaining of intermittent constipation for the past month, constant for the past 2-3 days. Pt states that his last normal BM was 1 week ago but he has had very small BMs over the past few days. He states that he has tried medications without significant relief. He denies loss of appetite.   Past Medical History  Diagnosis Date  . Diabetes mellitus   . Diverticulosis   . Hypertension   . PVD (peripheral vascular disease) ([place])   . Renal insufficiency   . Stroke ([place])   . Hyperlipidemia   . GERD (gastroesophageal reflux disease)   . Constipation    Past Surgical History  Procedure Laterality Date  . Kidney stone surgery    . Vertebral artery stent    . Hernia repair      right inguinal  . Left groin exploration    . Esophagogastroduodenoscopy N/A 01/08/2013    Procedure: ESOPHAGOGASTRODUODENOSCOPY (EGD);  Surgeon: Ladene Artist, MD;  Location: [facility];  Service: Endoscopy;  Laterality: N/A;   Social History   Social History  . Marital Status: Married    Spouse Name: N/A  . Number of Children: N/A  . Years of Education: N/A   Occupational History  . Retired    Social History Main Topics  . Smoking status: Never Smoker   . Smokeless tobacco: Never Used  . Alcohol Use: No  . Drug Use: No  . Sexual Activity: Not Currently   Other Topics Concern  . None   Social History Narrative   Married    Independent         Family History  Problem Relation Age of Onset  . Diabetes  Mother   . Heart disease Mother   . Colon cancer Neg Hx    Allergies  Allergen Reactions  . Prednisone     psycosis   Prior to Admission medications   Medication Sig Start Date End Date Taking? Authorizing Provider  aspirin 81 MG tablet Take 81 mg by mouth daily.     Yes Historical Provider, MD  diclofenac sodium (VOLTAREN) 1 % GEL Apply topically 4 (four) times daily.   Yes Historical Provider, MD  docusate sodium (COLACE) 100 MG capsule Take 100 mg by mouth daily.   Yes Historical Provider, MD  metFORMIN (GLUMETZA) 500 MG (MOD) 24 hr tablet Take 500 mg by mouth daily with breakfast.   Yes Historical Provider, MD  bisoprolol-hydrochlorothiazide (ZIAC) 2.5-6.25 MG per tablet Take 1 tablet by mouth daily. 11/23/12 11/23/13  Ricard Dillon, MD  cephALEXin [facility]) 500 MG capsule Reported on 07/14/2015 10/12/12   Historical Provider, MD  clopidogrel (PLAVIX) 75 MG tablet Take 1 tablet (75 mg total) by mouth daily. Patient not taking: Reported on 07/14/2015 07/06/12   Ricard Dillon, MD  famotidine (PEPCID AC) 10 MG chewable tablet Chew 1 tablet (10 mg total) by mouth 2 (two) times daily. Patient not taking: Reported on 07/14/2015 07/06/13  Ricard Dillon, MD  glyBURIDE (DIABETA) 2.5 MG tablet Take 2.5 mg by mouth daily with breakfast. Reported on 07/14/2015    Historical Provider, MD  Multiple Vitamins-Minerals (ICAPS MV PO) Take by mouth 2 (two) times daily. Reported on 07/14/2015    Historical Provider, MD  Tamsulosin HCl (FLOMAX) 0.4 MG CAPS Take 2 capsules (0.8 mg total) by mouth daily. Patient not taking: Reported on 07/14/2015 07/25/12   Ricard Dillon, MD     ROS: The patient denies fevers, chills, night sweats, appetite change, unintentional weight loss, chest pain, palpitations, wheezing, dyspnea on exertion, nausea, vomiting, abdominal pain, dysuria, hematuria, melena, numbness, weakness, or tingling. +constipation   All other systems have been reviewed and were otherwise negative  with the exception of those mentioned in the HPI and as above.    PHYSICAL EXAM: Filed Vitals:   07/14/15 1212  BP: 108/74  Pulse: 102  Temp: 98.1 F (36.7 C)  Resp: 17   Body mass index is 22.61 kg/(m^2).   General: Alert, no acute distress HEENT:  Normocephalic, atraumatic, oropharynx patent. Eye: Juliette Mangle [facility] Cardiovascular:  Regular rate and rhythm, no rubs or gallops. No Carotid bruits, radial pulse intact. No pedal edema. Grade 2 harsh systolic murmur at lower L sternal border. Prominent aortic pulsation but no definite aneurysm.  Respiratory: Clear to auscultation bilaterally. No wheezes, rales, or rhonchi. No cyanosis, no use of accessory musculature Abdominal: No organomegaly, abdomen is flat and soft. Bowel sounds are present. No tenderness. No distension. No masses. Musculoskeletal: Gait intact. No edema, tenderness Skin: No rashes. Neurologic: Facial musculature symmetric. Psychiatric: Patient acts appropriately throughout our interaction. Lymphatic: No cervical or submandibular lymphadenopathy  LABS: Results for orders placed or performed in visit on 07/14/15  POCT CBC  Result Value Ref Range   WBC 13.5 (A) 4.6 - 10.2 K/uL   Lymph, poc 4.6 (A) 0.6 - 3.4   POC LYMPH PERCENT 34.1 10 - 50 %L   MID (cbc) 0.5 0 - 0.9   POC MID % 3.7 0 - 12 %M   POC Granulocyte 8.4 (A) 2 - 6.9   Granulocyte percent 62.2 37 - 80 %G   RBC 5.15 4.69 - 6.13 M/uL   Hemoglobin 15.0 14.1 - 18.1 g/dL   HCT, POC 45.0 43.5 - 53.7 %   MCV 87.5 80 - 97 fL   MCH, POC 29.2 27 - 31.2 pg   MCHC 33.3 31.8 - 35.4 g/dL   RDW, POC 14.2 %   Platelet Count, POC 102 (A) 142 - 424 K/uL   MPV 7.9 0 - 99.8 fL  POCT urinalysis dipstick  Result Value Ref Range   Color, UA yellow yellow   Clarity, UA clear clear   Glucose, UA negative negative   Bilirubin, UA negative negative   Ketones, POC UA negative negative   Spec Grav, UA 1.020    Blood, UA negative negative   pH, UA 5.5    Protein Ur, POC  =30 (A) negative   Urobilinogen, UA 0.2    Nitrite, UA Negative Negative   Leukocytes, UA Negative Negative  POCT glucose (manual entry)  Result Value Ref Range   POC Glucose 163 (A) 70 - 99 mg/dl   EKG/XRAY:   Primary read interpreted by Dr. Everlene Farrier at [facility].patient has linear  Calcifications in the right base as well as calcifications left side of the lung consistent with probable asbestos exposure. There is a questionable air-fluid level right lower abdomen  No definite evidence  of obstruction.  ASSESSMENT/PLAN: Patient sent to [facility] ER. I spoke with the triage nurse will be evor his obstipation as well as elevated white count. He has a history of diverticulosis. Radiology reading of his x-ray were still pending. I did speak to triage at the emergency room.I personally performed the services described in this documentation, which was scribed in my presence. The recorded information has been reviewed and is accurate.   Gross sideeffects, risk and benefits, and alternatives of medications d/w patient. Patient is aware that all medications have potential sideeffects and we are unable to predict every sideeffect or drug-drug interaction that may occur.  Arlyss Queen MD 07/14/2015 12:34 PM

## 2015-07-15 ENCOUNTER — Encounter: Payer: Self-pay | Admitting: *Deleted

## 2015-07-22 ENCOUNTER — Emergency Department (HOSPITAL_COMMUNITY)
Admission: EM | Admit: 2015-07-22 | Discharge: 2015-07-23 | Disposition: A | Discharge status: Home / Self Care | Payer: Medicare Other | Attending: Emergency Medicine | Admitting: Emergency Medicine

## 2015-07-22 DIAGNOSIS — K5900 Constipation, unspecified: Secondary | ICD-10-CM | POA: Diagnosis present

## 2015-07-22 DIAGNOSIS — Z8673 Personal history of transient ischemic attack (TIA), and cerebral infarction without residual deficits: Secondary | ICD-10-CM | POA: Insufficient documentation

## 2015-07-22 DIAGNOSIS — Z7984 Long term (current) use of oral hypoglycemic drugs: Secondary | ICD-10-CM | POA: Insufficient documentation

## 2015-07-22 DIAGNOSIS — Z8639 Personal history of other endocrine, nutritional and metabolic disease: Secondary | ICD-10-CM | POA: Insufficient documentation

## 2015-07-22 DIAGNOSIS — I10 Essential (primary) hypertension: Secondary | ICD-10-CM | POA: Insufficient documentation

## 2015-07-22 DIAGNOSIS — E119 Type 2 diabetes mellitus without complications: Secondary | ICD-10-CM | POA: Diagnosis not present

## 2015-07-22 DIAGNOSIS — K219 Gastro-esophageal reflux disease without esophagitis: Secondary | ICD-10-CM | POA: Insufficient documentation

## 2015-07-22 DIAGNOSIS — Z79899 Other long term (current) drug therapy: Secondary | ICD-10-CM | POA: Diagnosis not present

## 2015-07-22 DIAGNOSIS — Z87448 Personal history of other diseases of urinary system: Secondary | ICD-10-CM | POA: Diagnosis not present

## 2015-07-22 DIAGNOSIS — N400 Enlarged prostate without lower urinary tract symptoms: Secondary | ICD-10-CM | POA: Diagnosis not present

## 2015-07-22 DIAGNOSIS — Z791 Long term (current) use of non-steroidal anti-inflammatories (NSAID): Secondary | ICD-10-CM | POA: Diagnosis not present

## 2015-07-22 DIAGNOSIS — R39198 Other difficulties with micturition: Secondary | ICD-10-CM | POA: Diagnosis not present

## 2015-07-22 LAB — COMPREHENSIVE METABOLIC PANEL
ALBUMIN: 4.1 g/dL (ref 3.5–5.0)
ALK PHOS: 56 U/L (ref 38–126)
ALT: 13 U/L — AB (ref 17–63)
AST: 14 U/L — ABNORMAL LOW (ref 15–41)
Anion gap: 8 (ref 5–15)
BUN: 21 mg/dL — ABNORMAL HIGH (ref 6–20)
CHLORIDE: 107 mmol/L (ref 101–111)
CO2: 25 mmol/L (ref 22–32)
CREATININE: 1.58 mg/dL — AB (ref 0.61–1.24)
Calcium: 9.3 mg/dL (ref 8.9–10.3)
GFR calc non Af Amer: 35 mL/min — ABNORMAL LOW (ref >60)
GFR, EST AFRICAN AMERICAN: 40 mL/min — AB (ref >60)
GLUCOSE: 190 mg/dL — AB (ref 65–99)
Potassium: 4 mmol/L (ref 3.5–5.1)
SODIUM: 140 mmol/L (ref 135–145)
Total Bilirubin: 0.3 mg/dL (ref 0.3–1.2)
Total Protein: 6.6 g/dL (ref 6.5–8.1)

## 2015-07-22 LAB — CBC
HCT: 43.7 % (ref 39.0–52.0)
HEMOGLOBIN: 14.1 g/dL (ref 13.0–17.0)
MCH: 29.1 pg (ref 26.0–34.0)
MCHC: 32.3 g/dL (ref 30.0–36.0)
MCV: 90.3 fL (ref 78.0–100.0)
PLATELETS: 204 10*3/uL (ref 150–400)
RBC: 4.84 MIL/uL (ref 4.22–5.81)
RDW: 13.6 % (ref 11.5–15.5)
WBC: 9.2 10*3/uL (ref 4.0–10.5)

## 2015-07-22 LAB — LIPASE, BLOOD: LIPASE: 36 U/L (ref 11–51)

## 2015-07-22 NOTE — ED Notes (Signed)
Pt c/o constipation x 2 wks.  No pain.  Also c/o skin problem on his hands.  Nothing noted on skin.

## 2015-07-23 LAB — URINALYSIS, ROUTINE W REFLEX MICROSCOPIC
BILIRUBIN URINE: NEGATIVE
Glucose, UA: NEGATIVE mg/dL
HGB URINE DIPSTICK: NEGATIVE
Ketones, ur: NEGATIVE mg/dL
Leukocytes, UA: NEGATIVE
Nitrite: NEGATIVE
PH: 5.5 (ref 5.0–8.0)
Protein, ur: NEGATIVE mg/dL
SPECIFIC GRAVITY, URINE: 1.017 (ref 1.005–1.030)

## 2015-07-23 LAB — DIFFERENTIAL
BASOS ABS: 0 10*3/uL (ref 0.0–0.1)
Basophils Relative: 0 %
EOS PCT: 2 %
Eosinophils Absolute: 0.2 10*3/uL (ref 0.0–0.7)
LYMPHS ABS: 3 10*3/uL (ref 0.7–4.0)
LYMPHS PCT: 32 %
MONOS PCT: 9 %
Monocytes Absolute: 0.8 10*3/uL (ref 0.1–1.0)
NEUTROS PCT: 57 %
Neutro Abs: 5.4 10*3/uL (ref 1.7–7.7)

## 2015-07-23 MED ORDER — SODIUM CHLORIDE 0.9 % IV BOLUS (SEPSIS)
500.0000 mL | Freq: Once | INTRAVENOUS | Status: AC
  Administered 2015-07-23: 500 mL via INTRAVENOUS

## 2015-07-23 MED ORDER — MILK AND MOLASSES ENEMA
1.0000 | Freq: Once | RECTAL | Status: AC
  Administered 2015-07-23: 250 mL via RECTAL
  Filled 2015-07-23: qty 250

## 2015-07-23 NOTE — ED Notes (Signed)
PT STATES HE WILL GIVE A URINE SAMPLE AFTER HE WARMS UP.

## 2015-07-23 NOTE — ED Notes (Signed)
Awake. Verbally responsive. A/O x4. Resp even and unlabored. No audible adventitious breath sounds noted. ABC's intact. Pt is driving home. Waiting for daylight prior to d/c home.

## 2015-07-23 NOTE — ED Notes (Signed)
Awake. Verbally responsive. A/O x4. Resp even and unlabored. No audible adventitious breath sounds noted. ABC's intact.  

## 2015-07-23 NOTE — ED Notes (Signed)
Pt lives at [facility]- [phone]. [place]? [phone].

## 2015-07-23 NOTE — ED Provider Notes (Signed)
CSN: [id number]     Arrival date & time 07/22/15  1658 History  By signing my name below, I, Irene Pap, attest that this documentation has been prepared under the direction and in the presence of Gareth Morgan, MD. Electronically Signed: Irene Pap, ED Scribe. 07/23/2015. 3:58 AM.    Chief Complaint  Patient presents with  . Constipation   Patient is a 81 y.o. male presenting with constipation. The history is provided by the patient. No language interpreter was used.  Constipation Severity:  Moderate Time since last bowel movement:  2 weeks Timing:  Constant Progression:  Worsening Chronicity:  Recurrent Ineffective treatments:  None tried Associated symptoms: abdominal pain   Associated symptoms: no back pain, no diarrhea, no dysuria, no fever, no nausea and no vomiting   HPI Comments: Don Montoya is a 81 y.o. Male with a hx of DM, diverticulosis, HTN, PVD, stroke, and constipation who presents to the Emergency Department complaining of constipation onset 2 weeks ago. Pt was seen in the ED on 07/14/15 for the same. He states that he has been taking the pills to no relief. Had been taking miralax however reports they took him off of it and now he is taking other pills which are not helping.  Occasional cramping abdominal pain, however no pain at this time. He states that he has been having urinary problems for 3 weeks, including decreased urine output. He denies fever, chills, cough, nausea, vomiting, or diarrhea.  Reports he is passing flatus.   Past Medical History  Diagnosis Date  . Diabetes mellitus   . Diverticulosis   . Hypertension   . PVD (peripheral vascular disease) ([place])   . Renal insufficiency   . Stroke ([place])   . Hyperlipidemia   . GERD (gastroesophageal reflux disease)   . Constipation    Past Surgical History  Procedure Laterality Date  . Kidney stone surgery    . Vertebral artery stent    . Hernia repair      right inguinal  . Left groin  exploration    . Esophagogastroduodenoscopy N/A 01/08/2013    Procedure: ESOPHAGOGASTRODUODENOSCOPY (EGD);  Surgeon: Ladene Artist, MD;  Location: [facility];  Service: Endoscopy;  Laterality: N/A;   Family History  Problem Relation Age of Onset  . Diabetes Mother   . Heart disease Mother   . Colon cancer Neg Hx    Social History  Substance Use Topics  . Smoking status: Never Smoker   . Smokeless tobacco: Never Used  . Alcohol Use: No    Review of Systems  Constitutional: Negative for fever and chills.  HENT: Negative for sore throat.   Eyes: Negative for visual disturbance.  Respiratory: Negative for shortness of breath.   Cardiovascular: Negative for chest pain.  Gastrointestinal: Positive for abdominal pain and constipation. Negative for nausea, vomiting and diarrhea.  Genitourinary: Positive for difficulty urinating. Negative for dysuria.  Musculoskeletal: Negative for back pain and neck stiffness.  Skin: Negative for rash.  Neurological: Negative for syncope and headaches.   Allergies  Prednisone  Home Medications   Prior to Admission medications   Medication Sig Start Date End Date Taking? Authorizing Provider  bisoprolol-hydrochlorothiazide (ZIAC) 2.5-6.25 MG per tablet Take 1 tablet by mouth daily. 11/23/12 11/23/13  Ricard Dillon, MD  clopidogrel (PLAVIX) 75 MG tablet Take 1 tablet (75 mg total) by mouth daily. Patient not taking: Reported on 07/14/2015 07/06/12   Ricard Dillon, MD  diclofenac sodium (VOLTAREN) 1 % GEL Apply  topically 4 (four) times daily.    Historical Provider, MD  docusate sodium (COLACE) 100 MG capsule Take 100 mg by mouth daily.    Historical Provider, MD  famotidine (PEPCID AC) 10 MG chewable tablet Chew 1 tablet (10 mg total) by mouth 2 (two) times daily. Patient not taking: Reported on 07/14/2015 07/06/13   Ricard Dillon, MD  glyBURIDE (DIABETA) 2.5 MG tablet Take 2.5 mg by mouth daily with breakfast. Reported on 07/14/2015    Historical  Provider, MD  metFORMIN (GLUMETZA) 500 MG (MOD) 24 hr tablet Take 500 mg by mouth daily with breakfast.    Historical Provider, MD  Multiple Vitamins-Minerals (ICAPS MV PO) Take by mouth 2 (two) times daily. Reported on 07/14/2015    Historical Provider, MD  omeprazole (PRILOSEC) 20 MG capsule Take 40 mg by mouth daily.    Historical Provider, MD  Sennosides (EX-LAX PO) Take 1 tablet by mouth daily as needed (constipation).    Historical Provider, MD  Tamsulosin HCl (FLOMAX) 0.4 MG CAPS Take 2 capsules (0.8 mg total) by mouth daily. Patient not taking: Reported on 07/14/2015 07/25/12   Ricard Dillon, MD   BP 110/63 mmHg  Pulse 99  Temp(Src) 97.5 F (36.4 C) (Oral)  Resp 18  SpO2 97% Physical Exam  Constitutional: He is oriented to person, place, and time. He appears well-developed and well-nourished. No distress.  HENT:  Head: Normocephalic and atraumatic.  Eyes: Conjunctivae and EOM are normal.  Neck: Normal range of motion.  Cardiovascular: Normal rate, regular rhythm, normal heart sounds and intact distal pulses.  Exam reveals no gallop and no friction rub.   No murmur heard. Pulmonary/Chest: Effort normal and breath sounds normal. No respiratory distress. He has no wheezes. He has no rales.  Abdominal: Soft. He exhibits no distension. There is no tenderness. There is no guarding.  Genitourinary: Prostate is enlarged. Prostate is not tender.  Musculoskeletal: He exhibits no edema.  Neurological: He is alert and oriented to person, place, and time.  Skin: Skin is warm and dry. He is not diaphoretic.  Nursing note and vitals reviewed.   ED Course  Procedures (including critical care time) DIAGNOSTIC STUDIES: Oxygen Saturation is 100% on RA, normal by my interpretation.    COORDINATION OF CARE: 12:16 AM-Discussed treatment plan which includes labs and enema with pt at bedside and pt agreed to plan.    Labs Review Labs Reviewed  COMPREHENSIVE METABOLIC PANEL - Abnormal;  Notable for the following:    Glucose, Bld 190 (*)    BUN 21 (*)    Creatinine, Ser 1.58 (*)    AST 14 (*)    ALT 13 (*)    GFR calc non Af Amer 35 (*)    GFR calc Af Amer 40 (*)    All other components within normal limits  LIPASE, BLOOD  CBC  URINALYSIS, ROUTINE W REFLEX MICROSCOPIC (NOT AT [facility])  DIFFERENTIAL    Imaging Review No results found. I have personally reviewed and evaluated these images and lab results as part of my medical decision-making.   EKG Interpretation None      MDM   Final diagnoses:  Constipation, unspecified constipation type   81 y.o. Male with a hx of DM, diverticulosis, HTN, PVD, stroke, and constipation who presents to the Emergency Department complaining of constipation for 1.5  Weeks. Pt was seen in the ED on 07/14/15 for the same and had CT at that time which was negative.  Given history of  significant constipation in absence of vital sign changes, significant or current abdominal pain, abdominal tenderness/distention, n/v, have low suspicion for other acute intraabdominal process at this time.  Patient reports some urinary retention and is noted to have enlarged prostate on exam however no tenderness and doubt prostatitis. Urinalysis shows no sign of infection. CMP shows no significant change in renal function, with prior Cr of 1.5 in 2014, Cr today 1.58, however given most recent 1.3, and mildly elevated BUN, gave 500cc IVF.  Pt able to urinate in ED and do not feel foley cathter is appropraite at this time.  Given enema for constipation with ability to have BM and improvement in symptoms. Discussed hydration, using stool softeners, fiber.  Patient stable for discharge and outpt management. Patient discharged in stable condition with understanding of reasons to return.   I personally performed the services described in this documentation, which was scribed in my presence. The recorded information has been reviewed and is accurate.    Gareth Morgan, MD 07/23/15 2025

## 2015-07-23 NOTE — ED Notes (Signed)
INITIAL ASSESSMENT COMPLETED. PT C/O CONSTIPATION AND LOWER ABDOMINAL PAIN X1.5 WEEKS. AWAITING FURTHER ORDERS.

## 2015-07-23 NOTE — Discharge Instructions (Signed)

## 2015-07-23 NOTE — ED Notes (Signed)
Pt and staff was unable to locate vehicle. Representative from [employer] arrived to transport pt back to facility

## 2015-07-30 DIAGNOSIS — E46 Unspecified protein-calorie malnutrition: Secondary | ICD-10-CM | POA: Diagnosis not present

## 2015-07-30 DIAGNOSIS — N183 Chronic kidney disease, stage 3 (moderate): Secondary | ICD-10-CM | POA: Diagnosis not present

## 2015-07-30 DIAGNOSIS — I129 Hypertensive chronic kidney disease with stage 1 through stage 4 chronic kidney disease, or unspecified chronic kidney disease: Secondary | ICD-10-CM | POA: Diagnosis not present

## 2015-07-30 DIAGNOSIS — G301 Alzheimer's disease with late onset: Secondary | ICD-10-CM | POA: Diagnosis not present

## 2015-07-30 DIAGNOSIS — E119 Type 2 diabetes mellitus without complications: Secondary | ICD-10-CM | POA: Diagnosis not present

## 2015-07-31 DIAGNOSIS — R278 Other lack of coordination: Secondary | ICD-10-CM | POA: Diagnosis not present

## 2015-07-31 DIAGNOSIS — R262 Difficulty in walking, not elsewhere classified: Secondary | ICD-10-CM | POA: Diagnosis not present

## 2015-07-31 DIAGNOSIS — R4182 Altered mental status, unspecified: Secondary | ICD-10-CM | POA: Diagnosis not present

## 2015-07-31 DIAGNOSIS — R1314 Dysphagia, pharyngoesophageal phase: Secondary | ICD-10-CM | POA: Diagnosis not present

## 2015-07-31 DIAGNOSIS — M1380 Other specified arthritis, unspecified site: Secondary | ICD-10-CM | POA: Diagnosis not present

## 2015-07-31 DIAGNOSIS — R41841 Cognitive communication deficit: Secondary | ICD-10-CM | POA: Diagnosis not present

## 2015-07-31 DIAGNOSIS — M1610 Unilateral primary osteoarthritis, unspecified hip: Secondary | ICD-10-CM | POA: Diagnosis not present

## 2015-08-01 DIAGNOSIS — R262 Difficulty in walking, not elsewhere classified: Secondary | ICD-10-CM | POA: Diagnosis not present

## 2015-08-01 DIAGNOSIS — M1380 Other specified arthritis, unspecified site: Secondary | ICD-10-CM | POA: Diagnosis not present

## 2015-08-01 DIAGNOSIS — R41841 Cognitive communication deficit: Secondary | ICD-10-CM | POA: Diagnosis not present

## 2015-08-01 DIAGNOSIS — R4182 Altered mental status, unspecified: Secondary | ICD-10-CM | POA: Diagnosis not present

## 2015-08-01 DIAGNOSIS — M1610 Unilateral primary osteoarthritis, unspecified hip: Secondary | ICD-10-CM | POA: Diagnosis not present

## 2015-08-01 DIAGNOSIS — R278 Other lack of coordination: Secondary | ICD-10-CM | POA: Diagnosis not present

## 2015-08-04 DIAGNOSIS — R262 Difficulty in walking, not elsewhere classified: Secondary | ICD-10-CM | POA: Diagnosis not present

## 2015-08-04 DIAGNOSIS — R4182 Altered mental status, unspecified: Secondary | ICD-10-CM | POA: Diagnosis not present

## 2015-08-04 DIAGNOSIS — R278 Other lack of coordination: Secondary | ICD-10-CM | POA: Diagnosis not present

## 2015-08-04 DIAGNOSIS — M1380 Other specified arthritis, unspecified site: Secondary | ICD-10-CM | POA: Diagnosis not present

## 2015-08-04 DIAGNOSIS — M1610 Unilateral primary osteoarthritis, unspecified hip: Secondary | ICD-10-CM | POA: Diagnosis not present

## 2015-08-04 DIAGNOSIS — R41841 Cognitive communication deficit: Secondary | ICD-10-CM | POA: Diagnosis not present

## 2015-08-05 DIAGNOSIS — M1610 Unilateral primary osteoarthritis, unspecified hip: Secondary | ICD-10-CM | POA: Diagnosis not present

## 2015-08-05 DIAGNOSIS — R41841 Cognitive communication deficit: Secondary | ICD-10-CM | POA: Diagnosis not present

## 2015-08-05 DIAGNOSIS — M1380 Other specified arthritis, unspecified site: Secondary | ICD-10-CM | POA: Diagnosis not present

## 2015-08-05 DIAGNOSIS — R278 Other lack of coordination: Secondary | ICD-10-CM | POA: Diagnosis not present

## 2015-08-05 DIAGNOSIS — R262 Difficulty in walking, not elsewhere classified: Secondary | ICD-10-CM | POA: Diagnosis not present

## 2015-08-05 DIAGNOSIS — R4182 Altered mental status, unspecified: Secondary | ICD-10-CM | POA: Diagnosis not present

## 2015-08-06 DIAGNOSIS — R4182 Altered mental status, unspecified: Secondary | ICD-10-CM | POA: Diagnosis not present

## 2015-08-06 DIAGNOSIS — R41841 Cognitive communication deficit: Secondary | ICD-10-CM | POA: Diagnosis not present

## 2015-08-06 DIAGNOSIS — R278 Other lack of coordination: Secondary | ICD-10-CM | POA: Diagnosis not present

## 2015-08-06 DIAGNOSIS — R1314 Dysphagia, pharyngoesophageal phase: Secondary | ICD-10-CM | POA: Diagnosis not present

## 2015-08-06 DIAGNOSIS — M1610 Unilateral primary osteoarthritis, unspecified hip: Secondary | ICD-10-CM | POA: Diagnosis not present

## 2015-08-06 DIAGNOSIS — M1380 Other specified arthritis, unspecified site: Secondary | ICD-10-CM | POA: Diagnosis not present

## 2015-08-06 DIAGNOSIS — R262 Difficulty in walking, not elsewhere classified: Secondary | ICD-10-CM | POA: Diagnosis not present

## 2015-08-07 DIAGNOSIS — R41841 Cognitive communication deficit: Secondary | ICD-10-CM | POA: Diagnosis not present

## 2015-08-07 DIAGNOSIS — R262 Difficulty in walking, not elsewhere classified: Secondary | ICD-10-CM | POA: Diagnosis not present

## 2015-08-07 DIAGNOSIS — M1610 Unilateral primary osteoarthritis, unspecified hip: Secondary | ICD-10-CM | POA: Diagnosis not present

## 2015-08-07 DIAGNOSIS — R4182 Altered mental status, unspecified: Secondary | ICD-10-CM | POA: Diagnosis not present

## 2015-08-07 DIAGNOSIS — M1380 Other specified arthritis, unspecified site: Secondary | ICD-10-CM | POA: Diagnosis not present

## 2015-08-07 DIAGNOSIS — R278 Other lack of coordination: Secondary | ICD-10-CM | POA: Diagnosis not present

## 2015-08-08 DIAGNOSIS — M1380 Other specified arthritis, unspecified site: Secondary | ICD-10-CM | POA: Diagnosis not present

## 2015-08-08 DIAGNOSIS — R278 Other lack of coordination: Secondary | ICD-10-CM | POA: Diagnosis not present

## 2015-08-08 DIAGNOSIS — M1610 Unilateral primary osteoarthritis, unspecified hip: Secondary | ICD-10-CM | POA: Diagnosis not present

## 2015-08-08 DIAGNOSIS — R4182 Altered mental status, unspecified: Secondary | ICD-10-CM | POA: Diagnosis not present

## 2015-08-08 DIAGNOSIS — R41841 Cognitive communication deficit: Secondary | ICD-10-CM | POA: Diagnosis not present

## 2015-08-08 DIAGNOSIS — R262 Difficulty in walking, not elsewhere classified: Secondary | ICD-10-CM | POA: Diagnosis not present

## 2015-08-27 DIAGNOSIS — K222 Esophageal obstruction: Secondary | ICD-10-CM | POA: Diagnosis not present

## 2015-08-27 DIAGNOSIS — M25572 Pain in left ankle and joints of left foot: Secondary | ICD-10-CM | POA: Diagnosis not present

## 2015-08-28 ENCOUNTER — Telehealth: Payer: Self-pay | Admitting: Gastroenterology

## 2015-08-28 NOTE — Telephone Encounter (Signed)
Left message on machine to call back  

## 2015-08-29 NOTE — Telephone Encounter (Signed)
Left message on machine to call back  

## 2015-09-01 ENCOUNTER — Other Ambulatory Visit: Payer: Self-pay | Admitting: Nurse Practitioner

## 2015-09-01 ENCOUNTER — Ambulatory Visit
Admission: RE | Admit: 2015-09-01 | Discharge: 2015-09-01 | Disposition: A | Discharge status: Home / Self Care | Payer: Medicare Other | Source: Ambulatory Visit | Attending: Nurse Practitioner | Admitting: Nurse Practitioner

## 2015-09-01 DIAGNOSIS — R05 Cough: Secondary | ICD-10-CM | POA: Diagnosis not present

## 2015-09-01 DIAGNOSIS — R112 Nausea with vomiting, unspecified: Secondary | ICD-10-CM | POA: Diagnosis not present

## 2015-09-01 DIAGNOSIS — R059 Cough, unspecified: Secondary | ICD-10-CM

## 2015-09-01 DIAGNOSIS — R509 Fever, unspecified: Secondary | ICD-10-CM | POA: Diagnosis not present

## 2015-09-01 NOTE — Telephone Encounter (Signed)
Pt has been scheduled with Cecille Rubin on 09/15/15 and added to the wait list

## 2015-09-05 DIAGNOSIS — K219 Gastro-esophageal reflux disease without esophagitis: Secondary | ICD-10-CM | POA: Diagnosis not present

## 2015-09-05 DIAGNOSIS — R4181 Age-related cognitive decline: Secondary | ICD-10-CM | POA: Diagnosis not present

## 2015-09-05 DIAGNOSIS — G301 Alzheimer's disease with late onset: Secondary | ICD-10-CM | POA: Diagnosis not present

## 2015-09-05 DIAGNOSIS — E1121 Type 2 diabetes mellitus with diabetic nephropathy: Secondary | ICD-10-CM | POA: Diagnosis not present

## 2015-09-05 DIAGNOSIS — F0281 Dementia in other diseases classified elsewhere with behavioral disturbance: Secondary | ICD-10-CM | POA: Diagnosis not present

## 2015-09-05 DIAGNOSIS — R2689 Other abnormalities of gait and mobility: Secondary | ICD-10-CM | POA: Diagnosis not present

## 2015-09-05 DIAGNOSIS — G308 Other Alzheimer's disease: Secondary | ICD-10-CM | POA: Diagnosis not present

## 2015-09-05 DIAGNOSIS — M6281 Muscle weakness (generalized): Secondary | ICD-10-CM | POA: Diagnosis not present

## 2015-09-05 DIAGNOSIS — Q393 Congenital stenosis and stricture of esophagus: Secondary | ICD-10-CM | POA: Diagnosis not present

## 2015-09-05 DIAGNOSIS — R1314 Dysphagia, pharyngoesophageal phase: Secondary | ICD-10-CM | POA: Diagnosis not present

## 2015-09-05 DIAGNOSIS — N183 Chronic kidney disease, stage 3 (moderate): Secondary | ICD-10-CM | POA: Diagnosis not present

## 2015-09-05 DIAGNOSIS — G309 Alzheimer's disease, unspecified: Secondary | ICD-10-CM | POA: Diagnosis not present

## 2015-09-06 DIAGNOSIS — E119 Type 2 diabetes mellitus without complications: Secondary | ICD-10-CM | POA: Diagnosis not present

## 2015-09-08 DIAGNOSIS — R4181 Age-related cognitive decline: Secondary | ICD-10-CM | POA: Diagnosis not present

## 2015-09-08 DIAGNOSIS — G308 Other Alzheimer's disease: Secondary | ICD-10-CM | POA: Diagnosis not present

## 2015-09-08 DIAGNOSIS — M6281 Muscle weakness (generalized): Secondary | ICD-10-CM | POA: Diagnosis not present

## 2015-09-08 DIAGNOSIS — R2689 Other abnormalities of gait and mobility: Secondary | ICD-10-CM | POA: Diagnosis not present

## 2015-09-08 DIAGNOSIS — G309 Alzheimer's disease, unspecified: Secondary | ICD-10-CM | POA: Diagnosis not present

## 2015-09-08 DIAGNOSIS — Q393 Congenital stenosis and stricture of esophagus: Secondary | ICD-10-CM | POA: Diagnosis not present

## 2015-09-09 DIAGNOSIS — R2689 Other abnormalities of gait and mobility: Secondary | ICD-10-CM | POA: Diagnosis not present

## 2015-09-09 DIAGNOSIS — G308 Other Alzheimer's disease: Secondary | ICD-10-CM | POA: Diagnosis not present

## 2015-09-09 DIAGNOSIS — G309 Alzheimer's disease, unspecified: Secondary | ICD-10-CM | POA: Diagnosis not present

## 2015-09-09 DIAGNOSIS — Q393 Congenital stenosis and stricture of esophagus: Secondary | ICD-10-CM | POA: Diagnosis not present

## 2015-09-09 DIAGNOSIS — M6281 Muscle weakness (generalized): Secondary | ICD-10-CM | POA: Diagnosis not present

## 2015-09-09 DIAGNOSIS — R4181 Age-related cognitive decline: Secondary | ICD-10-CM | POA: Diagnosis not present

## 2015-09-10 DIAGNOSIS — R2689 Other abnormalities of gait and mobility: Secondary | ICD-10-CM | POA: Diagnosis not present

## 2015-09-10 DIAGNOSIS — M6281 Muscle weakness (generalized): Secondary | ICD-10-CM | POA: Diagnosis not present

## 2015-09-10 DIAGNOSIS — R4181 Age-related cognitive decline: Secondary | ICD-10-CM | POA: Diagnosis not present

## 2015-09-10 DIAGNOSIS — Q393 Congenital stenosis and stricture of esophagus: Secondary | ICD-10-CM | POA: Diagnosis not present

## 2015-09-10 DIAGNOSIS — G308 Other Alzheimer's disease: Secondary | ICD-10-CM | POA: Diagnosis not present

## 2015-09-10 DIAGNOSIS — G309 Alzheimer's disease, unspecified: Secondary | ICD-10-CM | POA: Diagnosis not present

## 2015-09-11 DIAGNOSIS — G308 Other Alzheimer's disease: Secondary | ICD-10-CM | POA: Diagnosis not present

## 2015-09-11 DIAGNOSIS — G309 Alzheimer's disease, unspecified: Secondary | ICD-10-CM | POA: Diagnosis not present

## 2015-09-11 DIAGNOSIS — Q393 Congenital stenosis and stricture of esophagus: Secondary | ICD-10-CM | POA: Diagnosis not present

## 2015-09-11 DIAGNOSIS — R2689 Other abnormalities of gait and mobility: Secondary | ICD-10-CM | POA: Diagnosis not present

## 2015-09-11 DIAGNOSIS — R4181 Age-related cognitive decline: Secondary | ICD-10-CM | POA: Diagnosis not present

## 2015-09-11 DIAGNOSIS — M6281 Muscle weakness (generalized): Secondary | ICD-10-CM | POA: Diagnosis not present

## 2015-09-12 DIAGNOSIS — G309 Alzheimer's disease, unspecified: Secondary | ICD-10-CM | POA: Diagnosis not present

## 2015-09-12 DIAGNOSIS — M6281 Muscle weakness (generalized): Secondary | ICD-10-CM | POA: Diagnosis not present

## 2015-09-12 DIAGNOSIS — R4181 Age-related cognitive decline: Secondary | ICD-10-CM | POA: Diagnosis not present

## 2015-09-12 DIAGNOSIS — Q393 Congenital stenosis and stricture of esophagus: Secondary | ICD-10-CM | POA: Diagnosis not present

## 2015-09-12 DIAGNOSIS — G308 Other Alzheimer's disease: Secondary | ICD-10-CM | POA: Diagnosis not present

## 2015-09-12 DIAGNOSIS — R2689 Other abnormalities of gait and mobility: Secondary | ICD-10-CM | POA: Diagnosis not present

## 2015-09-15 ENCOUNTER — Encounter: Payer: Self-pay | Admitting: Physician Assistant

## 2015-09-15 ENCOUNTER — Ambulatory Visit (INDEPENDENT_AMBULATORY_CARE_PROVIDER_SITE_OTHER): Payer: Medicare Other | Admitting: Physician Assistant

## 2015-09-15 VITALS — BP 104/52 | HR 68 | Ht 66.0 in | Wt 136.2 lb

## 2015-09-15 DIAGNOSIS — Q393 Congenital stenosis and stricture of esophagus: Secondary | ICD-10-CM | POA: Diagnosis not present

## 2015-09-15 DIAGNOSIS — M6281 Muscle weakness (generalized): Secondary | ICD-10-CM | POA: Diagnosis not present

## 2015-09-15 DIAGNOSIS — G308 Other Alzheimer's disease: Secondary | ICD-10-CM | POA: Diagnosis not present

## 2015-09-15 DIAGNOSIS — K219 Gastro-esophageal reflux disease without esophagitis: Secondary | ICD-10-CM | POA: Diagnosis not present

## 2015-09-15 DIAGNOSIS — R1310 Dysphagia, unspecified: Secondary | ICD-10-CM | POA: Diagnosis not present

## 2015-09-15 DIAGNOSIS — K5900 Constipation, unspecified: Secondary | ICD-10-CM

## 2015-09-15 DIAGNOSIS — R4181 Age-related cognitive decline: Secondary | ICD-10-CM | POA: Diagnosis not present

## 2015-09-15 DIAGNOSIS — R2689 Other abnormalities of gait and mobility: Secondary | ICD-10-CM | POA: Diagnosis not present

## 2015-09-15 DIAGNOSIS — G309 Alzheimer's disease, unspecified: Secondary | ICD-10-CM | POA: Diagnosis not present

## 2015-09-15 MED ORDER — ESOMEPRAZOLE MAGNESIUM 40 MG PO PACK: 40.0000 mg | PACK | Freq: Every day | ORAL | Status: DC

## 2015-09-15 MED ORDER — POLYETHYLENE GLYCOL 3350 17 GM/SCOOP PO POWD: 1.0000 | Freq: Every day | ORAL | Status: AC

## 2015-09-15 NOTE — Progress Notes (Signed)
Patient ID: Don Montoya, male   DOB: 10/23/1914, 82 y.o.   MRN: [id number]    Review of pertinent gastrointestinal problems:  1. known GE junction stricture (ring vs peptic, dilated 5/10 up to 73mm), recurrent dysphagia; dilated 12/11 up to 66mm with CRE balloon; presented with acute food impaction 12/2012, EGD Dr. Fuller Plan cleared food impaction into stomach, noted benign appearing GE junction stricture (not dilated). 12/2012 EGD, Dr. Ardis Hughs, previously known GE junction stricture, lumen 8-9 mm, passage of the scope created using, minor mucosal tear. (He was on Plavix which was held), dilation not performed given her age, friability of the stricture; biopsies of stricture showed no neoplasm he was started on twice daily proton pump inhibitor; Office visit one month later, his swallowing was normal.  2. colonoscopy 2007 found diverticulosis, 2 diminutive polyps that were not removed due to age, comorbidities    History of Present Illness: Don Montoya  Is a pleasant 82 year old male known to Dr. Ardis Hughs from the above. He has a history of diabetes, diverticulosis, hypertension, peripheral vascular disease, and stroke. He also has hyperlipidemia and renal insufficiency. He presents today with recurrent complaints of dysphagia. He is having difficulty swallowing anything solid. He has to pure his food and eat very slowly or will get stuck. He does not have any dysphagia to liquids. He has been having significant heartburn but has been off his PPI for quite some time. He also states that he has been quite constipated. In the past he used Mira lax but he states he has not used it in about 7 months. He has been to the emergency room twice for abdominal pain due to constipation. He denies bright red blood per rectum or melena. He is asking to have "my gullet stretched again".   Past Medical History  Diagnosis Date  . Diabetes mellitus   . Diverticulosis   . Hypertension   . PVD (peripheral vascular  disease) ([place])   . Renal insufficiency   . Stroke ([place])   . Hyperlipidemia   . GERD (gastroesophageal reflux disease)   . Constipation   . Alzheimer disease     mild with some degree of paranoia  . Esophageal stricture     with dilation  . BPH (benign prostatic hyperplasia)   . Gout   . Peripheral neuropathy ([place])   . Aortic stenosis   . Bilateral inguinal hernia     Past Surgical History  Procedure Laterality Date  . Kidney stone surgery    . Vertebral artery stent    . Inguinal hernia repair Right     right inguinal  . Esophagogastroduodenoscopy N/A 01/08/2013    Procedure: ESOPHAGOGASTRODUODENOSCOPY (EGD);  Surgeon: Ladene Artist, MD;  Location: [facility];  Service: Endoscopy;  Laterality: N/A;  . Groin exploration Left    Family History  Problem Relation Age of Onset  . Diabetes Mother   . Heart disease Mother   . Colon cancer Neg Hx    Social History  Substance Use Topics  . Smoking status: Never Smoker   . Smokeless tobacco: Never Used  . Alcohol Use: No   Current Outpatient Prescriptions  Medication Sig Dispense Refill  . aspirin EC 81 MG tablet Take 81 mg by mouth daily.    . bisoprolol-hydrochlorothiazide (ZIAC) 2.5-6.25 MG per tablet Take 1 tablet by mouth daily. 30 tablet 11  . famotidine (PEPCID AC) 10 MG chewable tablet Chew 1 tablet (10 mg total) by mouth 2 (two) times daily. 60 tablet  3  . LORazepam (ATIVAN) 0.5 MG tablet Take 0.5 mg by mouth every 4 (four) hours as needed (agitation).    . metFORMIN (GLUMETZA) 500 MG (MOD) 24 hr tablet Take 500 mg by mouth daily with breakfast.    . Multiple Vitamins-Minerals (ICAPS MV PO) Take by mouth 2 (two) times daily. Reported on 07/14/2015    . Tamsulosin HCl (FLOMAX) 0.4 MG CAPS Take 2 capsules (0.8 mg total) by mouth daily. 60 capsule 11  . esomeprazole (NEXIUM) 40 MG packet Take 40 mg by mouth daily before breakfast. 30 each 12  . polyethylene glycol powder (GLYCOLAX/MIRALAX) powder Take 255 g by mouth  daily. 255 g 3   No current facility-administered medications for this visit.   Allergies  Allergen Reactions  . Prednisone     psycosis     Review of Systems:  per history of present illness otherwise negative.  Studies:   Dg Chest 2 View  09/01/2015  CLINICAL DATA:  Prolonged cough and congestion in chest, history diabetes mellitus, hypertension, stroke, renal insufficiency, peripheral vascular disease EXAM: CHEST  2 VIEW COMPARISON:  07/14/2015 FINDINGS: Normal heart size, mediastinal contours and pulmonary vascularity. Calcified pleural plaque disease bilaterally consistent with asbestos exposure. Underlying emphysematous changes. No acute infiltrate, pleural effusion or pneumothorax. Mild atherosclerotic calcification thoracic aorta. Bones demineralized. IMPRESSION: Prior asbestos exposure and question COPD. No acute abnormalities. Electronically Signed   By: Lavonia Dana M.D.   On: 09/01/2015 15:36     Physical Exam: BP 104/52 mmHg  Pulse 68  Ht 5\' 6"  (1.676 m)  Wt 136 lb 4 oz (61.803 kg)  BMI 22.00 kg/m2 General: Pleasant, well developed , male in no acute distress Head: Normocephalic and atraumatic Eyes:  sclerae anicteric, conjunctiva pink  Ears: Normal auditory acuity Lungs: Clear throughout to auscultation Heart: Regular rate and rhythm Abdomen: Soft, non distended, non-tender. No masses, no hepatomegaly. Normal bowel sounds Musculoskeletal: Symmetrical with no gross deformities  Extremities: No edema  Neurological: Alert oriented x 4, grossly nonfocal Psychological:  Alert and cooperative. Normal mood and affect  Assessment and Recommendations:  #1. GERD and dysphagia. An antireflux regimen has been reviewed. He's been instructed to continue a pured diet. He will be scheduled for a barium swallow with tablet. If it is revealing for recurrent stricture , he will be considered for repeat endoscopy. In the meantime, he has been started on Nexium packets, 1 packet in  water every morning 30 minutes prior to breakfast.    #2. Constipation. Patient will be restarted on Mira lax 2 capfuls in water daily. He will also use a glycerin suppository one per rectum daily as needed for constipation.        Yailine Ballard, Vita Barley PA-C 09/15/2015,

## 2015-09-15 NOTE — Patient Instructions (Signed)
You have been scheduled for a modified barium swallow on 09/19/15 at 9:30 am. Please arrive 15 minutes prior to your test for registration. You will go to [facility]iology (1st Floor) for your appointment. Please refrain from eating or drinking anything 4 hours prior to your test. Should you need to cancel or reschedule your appointment, please contact [phone] [facility]) or [phone] [place]). _____________________________________________________________________ A Modified Barium Swallow Study, or MBS, is a special x-ray that is taken to check swallowing skills. It is carried out by a Radiologist. During this test, yourmouth, throat, and esophagus, a muscular tube which connects your mouth to your stomach, is checked. The test will help you, your doctor.  What will happen during an MBS? You will be taken to an x-ray room and seated comfortably. You will be asked to swallow small amounts of food and liquid mixed with barium. Barium is a liquid or paste that allows images of your mouth, throat and esophagus to be seen on x-ray. The x-ray captures moving images of the food you are swallowing as it travels from your mouth through your throat and into your esophagus. This test helps identify whether food or liquid is entering your lungs (aspiration). The test also shows which part of your mouth or throat lacks strength or coordination to move the food or liquid in the right direction. This test typically takes 30 minutes to 1 hour to complete. _______________________________________________________________________  We have sent the following medications to your pharmacy for you to pick up at your convenience: Nexium packets 1 packet in 6 oz water 30 mins prior to breakfast.   Miralax 2 capfuls in water daily.   Glycerin suppositories 1 per rectum daily as needed for constipation.

## 2015-09-16 DIAGNOSIS — R4181 Age-related cognitive decline: Secondary | ICD-10-CM | POA: Diagnosis not present

## 2015-09-16 DIAGNOSIS — F0390 Unspecified dementia without behavioral disturbance: Secondary | ICD-10-CM | POA: Diagnosis not present

## 2015-09-16 DIAGNOSIS — M6281 Muscle weakness (generalized): Secondary | ICD-10-CM | POA: Diagnosis not present

## 2015-09-16 DIAGNOSIS — R2689 Other abnormalities of gait and mobility: Secondary | ICD-10-CM | POA: Diagnosis not present

## 2015-09-16 DIAGNOSIS — G309 Alzheimer's disease, unspecified: Secondary | ICD-10-CM | POA: Diagnosis not present

## 2015-09-16 DIAGNOSIS — Q393 Congenital stenosis and stricture of esophagus: Secondary | ICD-10-CM | POA: Diagnosis not present

## 2015-09-16 DIAGNOSIS — G308 Other Alzheimer's disease: Secondary | ICD-10-CM | POA: Diagnosis not present

## 2015-09-16 NOTE — Progress Notes (Signed)
i agree with the above note, plan 

## 2015-09-17 DIAGNOSIS — R2689 Other abnormalities of gait and mobility: Secondary | ICD-10-CM | POA: Diagnosis not present

## 2015-09-17 DIAGNOSIS — G308 Other Alzheimer's disease: Secondary | ICD-10-CM | POA: Diagnosis not present

## 2015-09-17 DIAGNOSIS — M6281 Muscle weakness (generalized): Secondary | ICD-10-CM | POA: Diagnosis not present

## 2015-09-17 DIAGNOSIS — Q393 Congenital stenosis and stricture of esophagus: Secondary | ICD-10-CM | POA: Diagnosis not present

## 2015-09-17 DIAGNOSIS — G309 Alzheimer's disease, unspecified: Secondary | ICD-10-CM | POA: Diagnosis not present

## 2015-09-17 DIAGNOSIS — R4181 Age-related cognitive decline: Secondary | ICD-10-CM | POA: Diagnosis not present

## 2015-09-18 DIAGNOSIS — Q393 Congenital stenosis and stricture of esophagus: Secondary | ICD-10-CM | POA: Diagnosis not present

## 2015-09-18 DIAGNOSIS — M6281 Muscle weakness (generalized): Secondary | ICD-10-CM | POA: Diagnosis not present

## 2015-09-18 DIAGNOSIS — G308 Other Alzheimer's disease: Secondary | ICD-10-CM | POA: Diagnosis not present

## 2015-09-18 DIAGNOSIS — R2689 Other abnormalities of gait and mobility: Secondary | ICD-10-CM | POA: Diagnosis not present

## 2015-09-18 DIAGNOSIS — R4181 Age-related cognitive decline: Secondary | ICD-10-CM | POA: Diagnosis not present

## 2015-09-18 DIAGNOSIS — G309 Alzheimer's disease, unspecified: Secondary | ICD-10-CM | POA: Diagnosis not present

## 2015-09-19 ENCOUNTER — Ambulatory Visit (HOSPITAL_COMMUNITY)
Admission: RE | Admit: 2015-09-19 | Discharge: 2015-09-19 | Disposition: A | Discharge status: Home / Self Care | Payer: Medicare Other | Source: Ambulatory Visit | Attending: Physician Assistant | Admitting: Physician Assistant

## 2015-09-19 DIAGNOSIS — R2689 Other abnormalities of gait and mobility: Secondary | ICD-10-CM | POA: Diagnosis not present

## 2015-09-19 DIAGNOSIS — K219 Gastro-esophageal reflux disease without esophagitis: Secondary | ICD-10-CM | POA: Diagnosis not present

## 2015-09-19 DIAGNOSIS — M6281 Muscle weakness (generalized): Secondary | ICD-10-CM | POA: Diagnosis not present

## 2015-09-19 DIAGNOSIS — G308 Other Alzheimer's disease: Secondary | ICD-10-CM | POA: Diagnosis not present

## 2015-09-19 DIAGNOSIS — R1310 Dysphagia, unspecified: Secondary | ICD-10-CM | POA: Diagnosis not present

## 2015-09-19 DIAGNOSIS — G309 Alzheimer's disease, unspecified: Secondary | ICD-10-CM | POA: Diagnosis not present

## 2015-09-19 DIAGNOSIS — R4181 Age-related cognitive decline: Secondary | ICD-10-CM | POA: Diagnosis not present

## 2015-09-19 DIAGNOSIS — Q393 Congenital stenosis and stricture of esophagus: Secondary | ICD-10-CM | POA: Diagnosis not present

## 2015-09-19 DIAGNOSIS — K222 Esophageal obstruction: Secondary | ICD-10-CM | POA: Diagnosis not present

## 2015-09-22 DIAGNOSIS — H6123 Impacted cerumen, bilateral: Secondary | ICD-10-CM | POA: Diagnosis not present

## 2015-09-23 DIAGNOSIS — Q393 Congenital stenosis and stricture of esophagus: Secondary | ICD-10-CM | POA: Diagnosis not present

## 2015-09-23 DIAGNOSIS — M6281 Muscle weakness (generalized): Secondary | ICD-10-CM | POA: Diagnosis not present

## 2015-09-23 DIAGNOSIS — G309 Alzheimer's disease, unspecified: Secondary | ICD-10-CM | POA: Diagnosis not present

## 2015-09-23 DIAGNOSIS — R2689 Other abnormalities of gait and mobility: Secondary | ICD-10-CM | POA: Diagnosis not present

## 2015-09-23 DIAGNOSIS — G308 Other Alzheimer's disease: Secondary | ICD-10-CM | POA: Diagnosis not present

## 2015-09-23 DIAGNOSIS — R4181 Age-related cognitive decline: Secondary | ICD-10-CM | POA: Diagnosis not present

## 2015-09-24 DIAGNOSIS — R1314 Dysphagia, pharyngoesophageal phase: Secondary | ICD-10-CM | POA: Diagnosis not present

## 2015-09-24 DIAGNOSIS — R1310 Dysphagia, unspecified: Secondary | ICD-10-CM | POA: Diagnosis not present

## 2015-09-24 DIAGNOSIS — K219 Gastro-esophageal reflux disease without esophagitis: Secondary | ICD-10-CM | POA: Diagnosis not present

## 2015-09-25 DIAGNOSIS — R1314 Dysphagia, pharyngoesophageal phase: Secondary | ICD-10-CM | POA: Diagnosis not present

## 2015-09-25 DIAGNOSIS — K219 Gastro-esophageal reflux disease without esophagitis: Secondary | ICD-10-CM | POA: Diagnosis not present

## 2015-09-25 DIAGNOSIS — R1310 Dysphagia, unspecified: Secondary | ICD-10-CM | POA: Diagnosis not present

## 2015-09-26 DIAGNOSIS — R1314 Dysphagia, pharyngoesophageal phase: Secondary | ICD-10-CM | POA: Diagnosis not present

## 2015-09-26 DIAGNOSIS — R1310 Dysphagia, unspecified: Secondary | ICD-10-CM | POA: Diagnosis not present

## 2015-09-26 DIAGNOSIS — K219 Gastro-esophageal reflux disease without esophagitis: Secondary | ICD-10-CM | POA: Diagnosis not present

## 2015-09-29 DIAGNOSIS — G301 Alzheimer's disease with late onset: Secondary | ICD-10-CM | POA: Diagnosis not present

## 2015-09-29 DIAGNOSIS — I10 Essential (primary) hypertension: Secondary | ICD-10-CM | POA: Diagnosis not present

## 2015-09-29 DIAGNOSIS — E1140 Type 2 diabetes mellitus with diabetic neuropathy, unspecified: Secondary | ICD-10-CM | POA: Diagnosis not present

## 2015-09-29 DIAGNOSIS — R1310 Dysphagia, unspecified: Secondary | ICD-10-CM | POA: Diagnosis not present

## 2015-09-29 DIAGNOSIS — F329 Major depressive disorder, single episode, unspecified: Secondary | ICD-10-CM | POA: Diagnosis not present

## 2015-09-29 DIAGNOSIS — R1314 Dysphagia, pharyngoesophageal phase: Secondary | ICD-10-CM | POA: Diagnosis not present

## 2015-09-29 DIAGNOSIS — K219 Gastro-esophageal reflux disease without esophagitis: Secondary | ICD-10-CM | POA: Diagnosis not present

## 2015-09-30 DIAGNOSIS — R1310 Dysphagia, unspecified: Secondary | ICD-10-CM | POA: Diagnosis not present

## 2015-09-30 DIAGNOSIS — R1314 Dysphagia, pharyngoesophageal phase: Secondary | ICD-10-CM | POA: Diagnosis not present

## 2015-09-30 DIAGNOSIS — K219 Gastro-esophageal reflux disease without esophagitis: Secondary | ICD-10-CM | POA: Diagnosis not present

## 2015-10-01 ENCOUNTER — Other Ambulatory Visit: Payer: Self-pay

## 2015-10-01 DIAGNOSIS — K222 Esophageal obstruction: Secondary | ICD-10-CM

## 2015-10-06 DIAGNOSIS — K4090 Unilateral inguinal hernia, without obstruction or gangrene, not specified as recurrent: Secondary | ICD-10-CM | POA: Diagnosis not present

## 2015-10-06 DIAGNOSIS — K5900 Constipation, unspecified: Secondary | ICD-10-CM | POA: Diagnosis not present

## 2015-10-09 ENCOUNTER — Encounter (HOSPITAL_COMMUNITY): Payer: Self-pay | Admitting: *Deleted

## 2015-10-09 NOTE — Progress Notes (Addendum)
Your procedure is scheduled [id number] 10-16-15  Report to [place] at:1000 Call this number if you have problems morning of your [phone]  Do not eat or drink anything after midnight the night before your procedure. You may brush your teeth, rinse out your mouth, but no water, no food, no chewing gum, no mints, no candies, no chewing tobacco.     Take these medicines the morning of your procedure with A SIP OF WATER:bisproprolol/hydrochlorothlorothiazide  NO DIABETIC MEDICATIONS MORNING OF PROCEDURE  FfACILITY TO PROVIDE TRANSPORTATION  [place] [phone]  LEAVE ALL VALUABLES, JEWELRY, BILLFOLD AT HOME.  NO DENTURES, CONTACT LENSES ALLOWED IN THE ENDOSCOPY ROOM.   YOU MAY WEAR DEODORANT, PLEASE REMOVE ALL JEWELRY, WATCHES RINGS, BODY PIERCINGS AND LEAVE AT HOME.   PLEASE SEND LATEST COPY OF PATIENTS MEDICATION RECORD AND LAST TIME OF EATING AND DR INKING DAY OF PROCEDURE  WIFE BARBARA POA  IS TO ACCOMPANY PATIENT AND PROVIDE CONSENT  [facility] , FACILITY WILL PROVIDE TRANSPORTATION AND SOEMONE TO STAY WITH PATIENT  SENT BY [facility] RN PHONE [phone] FAX [phone]

## 2015-10-10 DIAGNOSIS — R1030 Lower abdominal pain, unspecified: Secondary | ICD-10-CM | POA: Diagnosis not present

## 2015-10-10 DIAGNOSIS — R109 Unspecified abdominal pain: Secondary | ICD-10-CM | POA: Diagnosis not present

## 2015-10-11 NOTE — Anesthesia Preprocedure Evaluation (Addendum)
Anesthesia Evaluation  Patient identified by MRN, date of birth, ID band Patient awake    Reviewed: Allergy & Precautions, NPO status , Patient's Chart, lab work & pertinent test results  Airway Mallampati: II   Neck ROM: Full    Dental  (+) Lower Dentures, Upper Dentures   Pulmonary neg pulmonary ROS,    breath sounds clear to auscultation       Cardiovascular hypertension, Pt. on medications + Peripheral Vascular Disease  negative cardio ROS   Rhythm:Regular  ECHO 2015 EF 60% calcified aortic valve without obvious stenosis   Neuro/Psych Anxiety Basilar artery thrombosis with CVA CVA negative neurological ROS  negative psych ROS   GI/Hepatic negative GI ROS, Neg liver ROS, GERD  Medicated,Stricture EG junction   Endo/Other  negative endocrine ROSdiabetes, Poorly Controlled, Type 2, Oral Hypoglycemic Agents  Renal/GU Renal InsufficiencyRenal diseasenegative Renal ROSCreat 1.58,  RI stage  III  negative genitourinary   Musculoskeletal negative musculoskeletal ROS (+)   Abdominal   Peds negative pediatric ROS (+)  Hematology negative hematology ROS (+)   Anesthesia Other Findings   Reproductive/Obstetrics negative OB ROS                            Anesthesia Physical Anesthesia Plan  ASA: III  Anesthesia Plan: MAC   Post-op Pain Management:    Induction:   Airway Management Planned: Nasal Cannula  Additional Equipment:   Intra-op Plan:   Post-operative Plan:   Informed Consent: I have reviewed the patients History and Physical, chart, labs and discussed the procedure including the risks, benefits and alternatives for the proposed anesthesia with the patient or authorized representative who has indicated his/her understanding and acceptance.     Plan Discussed with:   Anesthesia Plan Comments:         Anesthesia Quick Evaluation

## 2015-10-14 NOTE — Progress Notes (Signed)
Faxed all pre op instructions to [facility] attention prisina terrry med tech, fax confirmation received and placed on chart, spoke with prisina terry on phone and all pre op instructions received and understood

## 2015-10-16 ENCOUNTER — Ambulatory Visit (HOSPITAL_COMMUNITY): Payer: Medicare Other | Admitting: Anesthesiology

## 2015-10-16 ENCOUNTER — Ambulatory Visit (HOSPITAL_COMMUNITY)
Admission: RE | Admit: 2015-10-16 | Discharge: 2015-10-16 | Disposition: A | Discharge status: Home / Self Care | Payer: Medicare Other | Source: Ambulatory Visit | Attending: Gastroenterology | Admitting: Gastroenterology

## 2015-10-16 ENCOUNTER — Encounter (HOSPITAL_COMMUNITY)
Admission: RE | Disposition: A | Discharge status: Home / Self Care | Payer: Self-pay | Source: Ambulatory Visit | Attending: Gastroenterology

## 2015-10-16 ENCOUNTER — Encounter (HOSPITAL_COMMUNITY): Payer: Self-pay | Admitting: Gastroenterology

## 2015-10-16 DIAGNOSIS — Z7982 Long term (current) use of aspirin: Secondary | ICD-10-CM | POA: Diagnosis not present

## 2015-10-16 DIAGNOSIS — N189 Chronic kidney disease, unspecified: Secondary | ICD-10-CM | POA: Diagnosis not present

## 2015-10-16 DIAGNOSIS — K219 Gastro-esophageal reflux disease without esophagitis: Secondary | ICD-10-CM | POA: Diagnosis not present

## 2015-10-16 DIAGNOSIS — Z7984 Long term (current) use of oral hypoglycemic drugs: Secondary | ICD-10-CM | POA: Diagnosis not present

## 2015-10-16 DIAGNOSIS — I739 Peripheral vascular disease, unspecified: Secondary | ICD-10-CM | POA: Diagnosis not present

## 2015-10-16 DIAGNOSIS — Z8673 Personal history of transient ischemic attack (TIA), and cerebral infarction without residual deficits: Secondary | ICD-10-CM | POA: Diagnosis not present

## 2015-10-16 DIAGNOSIS — Z79899 Other long term (current) drug therapy: Secondary | ICD-10-CM | POA: Diagnosis not present

## 2015-10-16 DIAGNOSIS — E1151 Type 2 diabetes mellitus with diabetic peripheral angiopathy without gangrene: Secondary | ICD-10-CM | POA: Diagnosis not present

## 2015-10-16 DIAGNOSIS — I10 Essential (primary) hypertension: Secondary | ICD-10-CM | POA: Insufficient documentation

## 2015-10-16 DIAGNOSIS — F0280 Dementia in other diseases classified elsewhere without behavioral disturbance: Secondary | ICD-10-CM | POA: Insufficient documentation

## 2015-10-16 DIAGNOSIS — E1140 Type 2 diabetes mellitus with diabetic neuropathy, unspecified: Secondary | ICD-10-CM | POA: Insufficient documentation

## 2015-10-16 DIAGNOSIS — R1310 Dysphagia, unspecified: Secondary | ICD-10-CM

## 2015-10-16 DIAGNOSIS — M1990 Unspecified osteoarthritis, unspecified site: Secondary | ICD-10-CM | POA: Insufficient documentation

## 2015-10-16 DIAGNOSIS — I129 Hypertensive chronic kidney disease with stage 1 through stage 4 chronic kidney disease, or unspecified chronic kidney disease: Secondary | ICD-10-CM | POA: Diagnosis not present

## 2015-10-16 DIAGNOSIS — K222 Esophageal obstruction: Secondary | ICD-10-CM | POA: Diagnosis not present

## 2015-10-16 DIAGNOSIS — G309 Alzheimer's disease, unspecified: Secondary | ICD-10-CM | POA: Insufficient documentation

## 2015-10-16 HISTORY — PX: ESOPHAGOGASTRODUODENOSCOPY (EGD) WITH PROPOFOL: SHX5813

## 2015-10-16 HISTORY — DX: Dysphagia, unspecified: R13.10

## 2015-10-16 HISTORY — DX: Anxiety disorder, unspecified: F41.9

## 2015-10-16 HISTORY — DX: Unspecified osteoarthritis, unspecified site: M19.90

## 2015-10-16 HISTORY — DX: Difficulty in walking, not elsewhere classified: R26.2

## 2015-10-16 HISTORY — DX: Constipation, unspecified: K59.00

## 2015-10-16 LAB — GLUCOSE, CAPILLARY: Glucose-Capillary: 117 mg/dL — ABNORMAL HIGH (ref 65–99)

## 2015-10-16 SURGERY — ESOPHAGOGASTRODUODENOSCOPY (EGD) WITH PROPOFOL
Anesthesia: Monitor Anesthesia Care

## 2015-10-16 MED ORDER — PROMETHAZINE HCL 25 MG/ML IJ SOLN: 6.2500 mg | INTRAMUSCULAR | Status: DC | PRN

## 2015-10-16 MED ORDER — MEPERIDINE HCL 100 MG/ML IJ SOLN: 6.2500 mg | INTRAMUSCULAR | Status: DC | PRN

## 2015-10-16 MED ORDER — PROPOFOL 500 MG/50ML IV EMUL
INTRAVENOUS | Status: DC | PRN
  Administered 2015-10-16: 100 ug/kg/min via INTRAVENOUS

## 2015-10-16 MED ORDER — PROPOFOL 500 MG/50ML IV EMUL
INTRAVENOUS | Status: DC | PRN
Start: 2015-10-16 — End: 2015-10-16
  Administered 2015-10-16 (×3): 30 mg via INTRAVENOUS

## 2015-10-16 MED ORDER — SODIUM CHLORIDE 0.9 % IV SOLN: INTRAVENOUS | Status: DC

## 2015-10-16 MED ORDER — LACTATED RINGERS IV SOLN
INTRAVENOUS | Status: DC
Start: 2015-10-16 — End: 2015-10-16
  Administered 2015-10-16: 12:00:00 via INTRAVENOUS

## 2015-10-16 SURGICAL SUPPLY — 14 items

## 2015-10-16 NOTE — Anesthesia Postprocedure Evaluation (Signed)
Anesthesia Post Note  Patient: HANY GRANDE  Procedure(s) Performed: Procedure(s) (LRB): ESOPHAGOGASTRODUODENOSCOPY (EGD) WITH PROPOFOL (N/A)  Patient location during evaluation: Endoscopy Anesthesia Type: MAC Level of consciousness: awake and alert Pain management: pain level controlled Vital Signs Assessment: post-procedure vital signs reviewed and stable Respiratory status: spontaneous breathing, nonlabored ventilation, respiratory function stable and patient connected to nasal cannula oxygen Cardiovascular status: blood pressure returned to baseline and stable Postop Assessment: no signs of nausea or vomiting Anesthetic complications: no    Last Vitals:  Filed Vitals:   10/16/15 1244 10/16/15 1250  BP: 95/42 85/38  Pulse: 62 58  Resp: 16 16    Last Pain: There were no vitals filed for this visit.               Alexis Frock

## 2015-10-16 NOTE — H&P (View-Only) (Signed)
i agree with the above note, plan 

## 2015-10-16 NOTE — Op Note (Signed)
[facility] Patient Name: Don Montoya Procedure Date: 10/16/2015 MRN: [id number] Attending MD: [name] , MD Date of Birth: 1915-02-01 CSN:  Age: 82 Admit Type: Outpatient Procedure:                Upper GI endoscopy Indications:              Dysphagia known GE junction stricture(ring vs                            peptic, dilated 11/2008 up to 69mm), recurrent                            dysphagia; dilated 06/2010 up to 49mm with CRE                            balloon; presented with acute food impaction                            12/2012, EGD Dr. Fuller Plan cleared food impaction into                            stomach, noted benign appearing GE junction                            stricture (not dilated). 12/2012 EGD, Dr. Ardis Hughs,                            previously known GE junction stricture, lumen 8-9                            mm, passage of the scope created using, minor                            mucosal tear. (He was on Plavix which was held),                            dilation not performed given her age, friability of                            the stricture; biopsies of stricture showed no                            neoplasm he was started on twice daily proton pump                            inhibitor; Office visit one month later, his                            swallowing was normal. 2017 recurrent dysphagia Providers:                [name], MD, Malka So, RN, [facility]  Jerline Pain, Technician Referring MD:              Medicines:                Monitored Anesthesia Care Complications:            No immediate complications. Estimated Blood Loss:     Estimated blood loss: none. Procedure:                Pre-Anesthesia Assessment:                           - Prior to the procedure, a History and Physical                            was performed, and patient medications and                            allergies were  reviewed. The patient's tolerance of                            previous anesthesia was also reviewed. The risks                            and benefits of the procedure and the sedation                            options and risks were discussed with the patient.                            All questions were answered, and informed consent                            was obtained. Prior Anticoagulants: The patient has                            taken no previous anticoagulant or antiplatelet                            agents. ASA Grade Assessment: III - A patient with                            severe systemic disease. After reviewing the risks                            and benefits, the patient was deemed in                            satisfactory condition to undergo the procedure.                           After obtaining informed consent, the endoscope was                            passed under direct vision. Throughout the  procedure, the patient's blood pressure, pulse, and                            oxygen saturations were monitored continuously. The                            [id number]) scope was introduced through the                            mouth, and advanced to the second part of duodenum.                            The upper GI endoscopy was accomplished without                            difficulty. The patient tolerated the procedure                            well. Scope In: Scope Out: Findings:      One moderate benign-appearing, intrinsic stenosis was found at the       gastroesophageal junction. This measured 8 mm (inner diameter) and was       traversed. A TTS dilator was passed through the scope. Dilation with       balloon dilator was performed to 10 mm, 11 mm, 12 mm and 13.5 mm. There       was the typical minor mucosal tear and self limited oozing of blood       following dilation.      The exam was otherwise without  abnormality. Impression:               - Benign-appearing esophageal stenosis. Dilated to                            13.69mm.                           - The examination was otherwise normal.                           - No specimens collected. Moderate Sedation:      N/A- Per Anesthesia Care Recommendation:           - Patient has a contact number available for                            emergencies. The signs and symptoms of potential                            delayed complications were discussed with the                            patient. Return to normal activities tomorrow.                            Written discharge instructions were provided to the  patient.                           - Resume previous diet.                           - Continue present medications.                           - Repeat upper endoscopy PRN for retreatment. Procedure Code(s):        --- Professional ---                           [phone], Esophagogastroduodenoscopy, flexible,                            transoral; with transendoscopic balloon dilation of                            esophagus (less than 30 mm diameter) Diagnosis Code(s):        --- Professional ---                           K22.2, Esophageal obstruction                           R13.10, Dysphagia, unspecified CPT copyright 2016 [employer] Association. All rights reserved. The codes documented in this report are preliminary and upon coder review may  be revised to meet current compliance requirements. [name], MD [name], MD 10/16/2015 12:41:36 PM Number of Addenda: 0

## 2015-10-16 NOTE — Transfer of Care (Signed)
Immediate Anesthesia Transfer of Care Note  Patient: Don Montoya  Procedure(s) Performed: Procedure(s) with comments: ESOPHAGOGASTRODUODENOSCOPY (EGD) WITH PROPOFOL (N/A) - DIL  Patient Location: PACU  Anesthesia Type:MAC  Level of Consciousness: sedated, patient cooperative and responds to stimulation  Airway & Oxygen Therapy: Patient Spontanous Breathing and Patient connected to nasal cannula oxygen  Post-op Assessment: Report given to RN and Post -op Vital signs reviewed and stable  Post vital signs: Reviewed and stable  Last Vitals:  Filed Vitals:   10/16/15 1038  BP: 118/83  Pulse: 60  Resp: 16    Complications: No apparent anesthesia complications

## 2015-10-16 NOTE — Interval H&P Note (Signed)
History and Physical Interval Note:  10/16/2015 10:24 AM  Don Montoya  has presented today for surgery, with the diagnosis of STRICTURE  The various methods of treatment have been discussed with the patient and family. After consideration of risks, benefits and other options for treatment, the patient has consented to  Procedure(s) with comments: ESOPHAGOGASTRODUODENOSCOPY (EGD) WITH PROPOFOL (N/A) - DIL as a surgical intervention .  The patient's history has been reviewed, patient examined, no change in status, stable for surgery.  I have reviewed the patient's chart and labs.  Questions were answered to the patient's satisfaction.     [name]

## 2015-10-16 NOTE — Discharge Instructions (Signed)

## 2015-10-21 DIAGNOSIS — F0390 Unspecified dementia without behavioral disturbance: Secondary | ICD-10-CM | POA: Diagnosis not present

## 2015-10-21 NOTE — H&P (Signed)
HPI: This is a 82 yo man with dysphagia  Chief complaint is dysphagia   Past Medical History  Diagnosis Date  . Diabetes mellitus   . Diverticulosis   . Hypertension   . PVD (peripheral vascular disease) ([place])   . Stroke ([place])   . Hyperlipidemia   . GERD (gastroesophageal reflux disease)   . Constipation   . Esophageal stricture     with dilation  . BPH (benign prostatic hyperplasia)   . Gout   . Peripheral neuropathy ([place])   . Aortic stenosis   . Bilateral inguinal hernia   . Anxiety   . Renal insufficiency     stage 3 moderate  . Arthritis     oa  . Dysphagia   . Difficulty walking   . BPH (benign prostatic hyperplasia)   . Constipation   . Alzheimer disease     mild with some degree of paranoia    Past Surgical History  Procedure Laterality Date  . Kidney stone surgery    . Vertebral artery stent    . Inguinal hernia repair Right     right inguinal  . Esophagogastroduodenoscopy N/A 01/08/2013    Procedure: ESOPHAGOGASTRODUODENOSCOPY (EGD);  Surgeon: Ladene Artist, MD;  Location: [facility];  Service: Endoscopy;  Laterality: N/A;  . Groin exploration Left   . Esophagogastroduodenoscopy (egd) with propofol N/A 10/16/2015    Procedure: ESOPHAGOGASTRODUODENOSCOPY (EGD) WITH PROPOFOL;  Surgeon: [name], MD;  Location: [facility];  Service: Endoscopy;  Laterality: N/A;  DIL    No current facility-administered medications for this encounter.   Current Outpatient Prescriptions  Medication Sig Dispense Refill  . aspirin EC 81 MG tablet Take 81 mg by mouth daily.    . bisoprolol-hydrochlorothiazide (ZIAC) 2.5-6.25 MG per tablet Take 1 tablet by mouth daily. 30 tablet 11  . citalopram (CELEXA) 10 MG/5ML suspension Take 10 mg by mouth daily.    Marland Kitchen esomeprazole (NEXIUM) 40 MG packet Take 40 mg by mouth daily before breakfast. 30 each 12  . famotidine (PEPCID AC) 10 MG chewable tablet Chew 1 tablet (10 mg total) by mouth 2 (two) times daily. 60 tablet 3  .  LORazepam (ATIVAN) 0.5 MG tablet Take 0.5 mg by mouth every 4 (four) hours as needed (agitation).    . metFORMIN (GLUMETZA) 500 MG (MOD) 24 hr tablet Take 500 mg by mouth daily with breakfast.    . Multiple Vitamins-Minerals (ICAPS MV PO) Take 1 tablet by mouth 2 (two) times daily. Reported on 07/14/2015    . polyethylene glycol powder (GLYCOLAX/MIRALAX) powder Take 255 g by mouth daily. (Patient taking differently: Take 1 Container by mouth See admin instructions. 17 grams twice daily as needed for constipation) 255 g 3  . Tamsulosin HCl (FLOMAX) 0.4 MG CAPS Take 2 capsules (0.8 mg total) by mouth daily. (Patient taking differently: Take 0.4 mg by mouth daily. ) 60 capsule 11    Allergies as of 10/01/2015 - Review Complete 09/15/2015  Allergen Reaction Noted  . Prednisone  05/12/2011    Family History  Problem Relation Age of Onset  . Diabetes Mother   . Heart disease Mother   . Colon cancer Neg Hx     Social History   Social History  . Marital Status: Married    Spouse Name: N/A  . Number of Children: 3  . Years of Education: N/A   Occupational History  . Retired    Social History Main Topics  . Smoking status: Never Smoker   .  Smokeless tobacco: Never Used  . Alcohol Use: No  . Drug Use: No  . Sexual Activity: Not Currently   Other Topics Concern  . Not on file   Social History Narrative   Married    Independent           Physical Exam: BP 105/50 mmHg  Pulse 55  Temp(Src) 97.9 F (36.6 C) (Oral)  Resp 16  Ht 5\' 6"  (1.676 m)  Wt 136 lb (61.689 kg)  BMI 21.96 kg/m2  SpO2 98% Constitutional: generally well-appearing Psychiatric: alert and oriented x3 Abdomen: soft, nontender, nondistended, no obvious ascites, no peritoneal signs, normal bowel sounds   Assessment and plan: 82 y.o. male with dysphagia  For EGD today   Owens Loffler, MD [facility] 10/21/2015, 11:24 AM

## 2015-10-23 DIAGNOSIS — K219 Gastro-esophageal reflux disease without esophagitis: Secondary | ICD-10-CM | POA: Diagnosis not present

## 2015-10-23 DIAGNOSIS — R1310 Dysphagia, unspecified: Secondary | ICD-10-CM | POA: Diagnosis not present

## 2015-10-23 DIAGNOSIS — R1314 Dysphagia, pharyngoesophageal phase: Secondary | ICD-10-CM | POA: Diagnosis not present

## 2015-10-27 DIAGNOSIS — R1314 Dysphagia, pharyngoesophageal phase: Secondary | ICD-10-CM | POA: Diagnosis not present

## 2015-10-27 DIAGNOSIS — R1310 Dysphagia, unspecified: Secondary | ICD-10-CM | POA: Diagnosis not present

## 2015-10-27 DIAGNOSIS — K219 Gastro-esophageal reflux disease without esophagitis: Secondary | ICD-10-CM | POA: Diagnosis not present

## 2015-10-29 DIAGNOSIS — R1310 Dysphagia, unspecified: Secondary | ICD-10-CM | POA: Diagnosis not present

## 2015-10-29 DIAGNOSIS — K219 Gastro-esophageal reflux disease without esophagitis: Secondary | ICD-10-CM | POA: Diagnosis not present

## 2015-10-29 DIAGNOSIS — R1314 Dysphagia, pharyngoesophageal phase: Secondary | ICD-10-CM | POA: Diagnosis not present

## 2015-10-30 DIAGNOSIS — N183 Chronic kidney disease, stage 3 (moderate): Secondary | ICD-10-CM | POA: Diagnosis not present

## 2015-10-30 DIAGNOSIS — R0989 Other specified symptoms and signs involving the circulatory and respiratory systems: Secondary | ICD-10-CM | POA: Diagnosis not present

## 2015-10-30 DIAGNOSIS — K219 Gastro-esophageal reflux disease without esophagitis: Secondary | ICD-10-CM | POA: Diagnosis not present

## 2015-10-30 DIAGNOSIS — R1310 Dysphagia, unspecified: Secondary | ICD-10-CM | POA: Diagnosis not present

## 2015-10-30 DIAGNOSIS — E1121 Type 2 diabetes mellitus with diabetic nephropathy: Secondary | ICD-10-CM | POA: Diagnosis not present

## 2015-10-30 DIAGNOSIS — J189 Pneumonia, unspecified organism: Secondary | ICD-10-CM | POA: Diagnosis not present

## 2015-10-30 DIAGNOSIS — R1314 Dysphagia, pharyngoesophageal phase: Secondary | ICD-10-CM | POA: Diagnosis not present

## 2015-10-30 DIAGNOSIS — E1142 Type 2 diabetes mellitus with diabetic polyneuropathy: Secondary | ICD-10-CM | POA: Diagnosis not present

## 2015-11-03 DIAGNOSIS — K219 Gastro-esophageal reflux disease without esophagitis: Secondary | ICD-10-CM | POA: Diagnosis not present

## 2015-11-03 DIAGNOSIS — R1310 Dysphagia, unspecified: Secondary | ICD-10-CM | POA: Diagnosis not present

## 2015-11-03 DIAGNOSIS — R1314 Dysphagia, pharyngoesophageal phase: Secondary | ICD-10-CM | POA: Diagnosis not present

## 2015-11-04 DIAGNOSIS — R1310 Dysphagia, unspecified: Secondary | ICD-10-CM | POA: Diagnosis not present

## 2015-11-04 DIAGNOSIS — R1314 Dysphagia, pharyngoesophageal phase: Secondary | ICD-10-CM | POA: Diagnosis not present

## 2015-11-04 DIAGNOSIS — K219 Gastro-esophageal reflux disease without esophagitis: Secondary | ICD-10-CM | POA: Diagnosis not present

## 2015-11-06 DIAGNOSIS — R1310 Dysphagia, unspecified: Secondary | ICD-10-CM | POA: Diagnosis not present

## 2015-11-06 DIAGNOSIS — K219 Gastro-esophageal reflux disease without esophagitis: Secondary | ICD-10-CM | POA: Diagnosis not present

## 2015-11-06 DIAGNOSIS — R1314 Dysphagia, pharyngoesophageal phase: Secondary | ICD-10-CM | POA: Diagnosis not present

## 2015-11-11 DIAGNOSIS — R1314 Dysphagia, pharyngoesophageal phase: Secondary | ICD-10-CM | POA: Diagnosis not present

## 2015-11-11 DIAGNOSIS — R1310 Dysphagia, unspecified: Secondary | ICD-10-CM | POA: Diagnosis not present

## 2015-11-11 DIAGNOSIS — F0390 Unspecified dementia without behavioral disturbance: Secondary | ICD-10-CM | POA: Diagnosis not present

## 2015-11-11 DIAGNOSIS — K219 Gastro-esophageal reflux disease without esophagitis: Secondary | ICD-10-CM | POA: Diagnosis not present

## 2015-11-18 ENCOUNTER — Encounter: Payer: Self-pay | Admitting: Gastroenterology

## 2015-11-27 DIAGNOSIS — E1121 Type 2 diabetes mellitus with diabetic nephropathy: Secondary | ICD-10-CM | POA: Diagnosis not present

## 2015-11-27 DIAGNOSIS — I129 Hypertensive chronic kidney disease with stage 1 through stage 4 chronic kidney disease, or unspecified chronic kidney disease: Secondary | ICD-10-CM | POA: Diagnosis not present

## 2015-11-27 DIAGNOSIS — N185 Chronic kidney disease, stage 5: Secondary | ICD-10-CM | POA: Diagnosis not present

## 2015-11-27 DIAGNOSIS — G301 Alzheimer's disease with late onset: Secondary | ICD-10-CM | POA: Diagnosis not present

## 2015-11-27 DIAGNOSIS — F325 Major depressive disorder, single episode, in full remission: Secondary | ICD-10-CM | POA: Diagnosis not present

## 2015-12-01 DIAGNOSIS — E119 Type 2 diabetes mellitus without complications: Secondary | ICD-10-CM | POA: Diagnosis not present

## 2015-12-03 DIAGNOSIS — N390 Urinary tract infection, site not specified: Secondary | ICD-10-CM | POA: Diagnosis not present

## 2015-12-03 DIAGNOSIS — R319 Hematuria, unspecified: Secondary | ICD-10-CM | POA: Diagnosis not present

## 2015-12-09 DIAGNOSIS — F0390 Unspecified dementia without behavioral disturbance: Secondary | ICD-10-CM | POA: Diagnosis not present

## 2015-12-10 DIAGNOSIS — I10 Essential (primary) hypertension: Secondary | ICD-10-CM | POA: Diagnosis not present

## 2015-12-16 DIAGNOSIS — M6281 Muscle weakness (generalized): Secondary | ICD-10-CM | POA: Diagnosis not present

## 2015-12-16 DIAGNOSIS — Z9181 History of falling: Secondary | ICD-10-CM | POA: Diagnosis not present

## 2015-12-16 DIAGNOSIS — R2689 Other abnormalities of gait and mobility: Secondary | ICD-10-CM | POA: Diagnosis not present

## 2015-12-17 DIAGNOSIS — M6281 Muscle weakness (generalized): Secondary | ICD-10-CM | POA: Diagnosis not present

## 2015-12-17 DIAGNOSIS — R2689 Other abnormalities of gait and mobility: Secondary | ICD-10-CM | POA: Diagnosis not present

## 2015-12-17 DIAGNOSIS — Z9181 History of falling: Secondary | ICD-10-CM | POA: Diagnosis not present

## 2015-12-19 DIAGNOSIS — R2689 Other abnormalities of gait and mobility: Secondary | ICD-10-CM | POA: Diagnosis not present

## 2015-12-19 DIAGNOSIS — Z9181 History of falling: Secondary | ICD-10-CM | POA: Diagnosis not present

## 2015-12-19 DIAGNOSIS — M6281 Muscle weakness (generalized): Secondary | ICD-10-CM | POA: Diagnosis not present

## 2015-12-22 DIAGNOSIS — Z9181 History of falling: Secondary | ICD-10-CM | POA: Diagnosis not present

## 2015-12-22 DIAGNOSIS — R2689 Other abnormalities of gait and mobility: Secondary | ICD-10-CM | POA: Diagnosis not present

## 2015-12-22 DIAGNOSIS — M6281 Muscle weakness (generalized): Secondary | ICD-10-CM | POA: Diagnosis not present

## 2015-12-24 DIAGNOSIS — R2689 Other abnormalities of gait and mobility: Secondary | ICD-10-CM | POA: Diagnosis not present

## 2015-12-24 DIAGNOSIS — M6281 Muscle weakness (generalized): Secondary | ICD-10-CM | POA: Diagnosis not present

## 2015-12-24 DIAGNOSIS — Z9181 History of falling: Secondary | ICD-10-CM | POA: Diagnosis not present

## 2015-12-26 DIAGNOSIS — R2689 Other abnormalities of gait and mobility: Secondary | ICD-10-CM | POA: Diagnosis not present

## 2015-12-26 DIAGNOSIS — Z9181 History of falling: Secondary | ICD-10-CM | POA: Diagnosis not present

## 2015-12-26 DIAGNOSIS — M6281 Muscle weakness (generalized): Secondary | ICD-10-CM | POA: Diagnosis not present

## 2015-12-28 DIAGNOSIS — N390 Urinary tract infection, site not specified: Secondary | ICD-10-CM | POA: Diagnosis not present

## 2015-12-29 DIAGNOSIS — Z9181 History of falling: Secondary | ICD-10-CM | POA: Diagnosis not present

## 2015-12-29 DIAGNOSIS — R2689 Other abnormalities of gait and mobility: Secondary | ICD-10-CM | POA: Diagnosis not present

## 2015-12-29 DIAGNOSIS — M6281 Muscle weakness (generalized): Secondary | ICD-10-CM | POA: Diagnosis not present

## 2015-12-31 DIAGNOSIS — M6281 Muscle weakness (generalized): Secondary | ICD-10-CM | POA: Diagnosis not present

## 2015-12-31 DIAGNOSIS — Z9181 History of falling: Secondary | ICD-10-CM | POA: Diagnosis not present

## 2015-12-31 DIAGNOSIS — R2689 Other abnormalities of gait and mobility: Secondary | ICD-10-CM | POA: Diagnosis not present

## 2016-01-02 DIAGNOSIS — Z9181 History of falling: Secondary | ICD-10-CM | POA: Diagnosis not present

## 2016-01-02 DIAGNOSIS — R2689 Other abnormalities of gait and mobility: Secondary | ICD-10-CM | POA: Diagnosis not present

## 2016-01-02 DIAGNOSIS — M6281 Muscle weakness (generalized): Secondary | ICD-10-CM | POA: Diagnosis not present

## 2016-01-05 DIAGNOSIS — M6281 Muscle weakness (generalized): Secondary | ICD-10-CM | POA: Diagnosis not present

## 2016-01-05 DIAGNOSIS — Z9181 History of falling: Secondary | ICD-10-CM | POA: Diagnosis not present

## 2016-01-05 DIAGNOSIS — R2689 Other abnormalities of gait and mobility: Secondary | ICD-10-CM | POA: Diagnosis not present

## 2016-01-07 DIAGNOSIS — Z9181 History of falling: Secondary | ICD-10-CM | POA: Diagnosis not present

## 2016-01-07 DIAGNOSIS — R2689 Other abnormalities of gait and mobility: Secondary | ICD-10-CM | POA: Diagnosis not present

## 2016-01-07 DIAGNOSIS — M6281 Muscle weakness (generalized): Secondary | ICD-10-CM | POA: Diagnosis not present

## 2016-01-09 DIAGNOSIS — R2689 Other abnormalities of gait and mobility: Secondary | ICD-10-CM | POA: Diagnosis not present

## 2016-01-09 DIAGNOSIS — Z9181 History of falling: Secondary | ICD-10-CM | POA: Diagnosis not present

## 2016-01-09 DIAGNOSIS — M6281 Muscle weakness (generalized): Secondary | ICD-10-CM | POA: Diagnosis not present

## 2016-01-13 DIAGNOSIS — F0390 Unspecified dementia without behavioral disturbance: Secondary | ICD-10-CM | POA: Diagnosis not present

## 2016-02-04 DIAGNOSIS — N183 Chronic kidney disease, stage 3 (moderate): Secondary | ICD-10-CM | POA: Diagnosis not present

## 2016-02-04 DIAGNOSIS — G301 Alzheimer's disease with late onset: Secondary | ICD-10-CM | POA: Diagnosis not present

## 2016-02-04 DIAGNOSIS — F325 Major depressive disorder, single episode, in full remission: Secondary | ICD-10-CM | POA: Diagnosis not present

## 2016-02-04 DIAGNOSIS — E1121 Type 2 diabetes mellitus with diabetic nephropathy: Secondary | ICD-10-CM | POA: Diagnosis not present

## 2016-02-05 DIAGNOSIS — E119 Type 2 diabetes mellitus without complications: Secondary | ICD-10-CM | POA: Diagnosis not present

## 2016-02-24 DIAGNOSIS — I872 Venous insufficiency (chronic) (peripheral): Secondary | ICD-10-CM | POA: Diagnosis not present

## 2016-03-31 DIAGNOSIS — E1121 Type 2 diabetes mellitus with diabetic nephropathy: Secondary | ICD-10-CM | POA: Diagnosis not present

## 2016-03-31 DIAGNOSIS — G301 Alzheimer's disease with late onset: Secondary | ICD-10-CM | POA: Diagnosis not present

## 2016-03-31 DIAGNOSIS — F325 Major depressive disorder, single episode, in full remission: Secondary | ICD-10-CM | POA: Diagnosis not present

## 2016-03-31 DIAGNOSIS — I129 Hypertensive chronic kidney disease with stage 1 through stage 4 chronic kidney disease, or unspecified chronic kidney disease: Secondary | ICD-10-CM | POA: Diagnosis not present

## 2016-03-31 DIAGNOSIS — N183 Chronic kidney disease, stage 3 (moderate): Secondary | ICD-10-CM | POA: Diagnosis not present

## 2016-03-31 DIAGNOSIS — M79673 Pain in unspecified foot: Secondary | ICD-10-CM | POA: Diagnosis not present

## 2016-04-01 DIAGNOSIS — E119 Type 2 diabetes mellitus without complications: Secondary | ICD-10-CM | POA: Diagnosis not present

## 2016-05-07 DIAGNOSIS — R319 Hematuria, unspecified: Secondary | ICD-10-CM | POA: Diagnosis not present

## 2016-05-07 DIAGNOSIS — N390 Urinary tract infection, site not specified: Secondary | ICD-10-CM | POA: Diagnosis not present

## 2016-06-03 DIAGNOSIS — E1121 Type 2 diabetes mellitus with diabetic nephropathy: Secondary | ICD-10-CM | POA: Diagnosis not present

## 2016-06-03 DIAGNOSIS — N183 Chronic kidney disease, stage 3 (moderate): Secondary | ICD-10-CM | POA: Diagnosis not present

## 2016-06-22 DIAGNOSIS — R1312 Dysphagia, oropharyngeal phase: Secondary | ICD-10-CM | POA: Diagnosis not present

## 2016-06-22 DIAGNOSIS — G308 Other Alzheimer's disease: Secondary | ICD-10-CM | POA: Diagnosis not present

## 2016-06-22 DIAGNOSIS — K219 Gastro-esophageal reflux disease without esophagitis: Secondary | ICD-10-CM | POA: Diagnosis not present

## 2016-06-23 DIAGNOSIS — R1312 Dysphagia, oropharyngeal phase: Secondary | ICD-10-CM | POA: Diagnosis not present

## 2016-06-23 DIAGNOSIS — G308 Other Alzheimer's disease: Secondary | ICD-10-CM | POA: Diagnosis not present

## 2016-06-23 DIAGNOSIS — K219 Gastro-esophageal reflux disease without esophagitis: Secondary | ICD-10-CM | POA: Diagnosis not present

## 2016-06-28 DIAGNOSIS — R1312 Dysphagia, oropharyngeal phase: Secondary | ICD-10-CM | POA: Diagnosis not present

## 2016-06-28 DIAGNOSIS — K219 Gastro-esophageal reflux disease without esophagitis: Secondary | ICD-10-CM | POA: Diagnosis not present

## 2016-06-28 DIAGNOSIS — G308 Other Alzheimer's disease: Secondary | ICD-10-CM | POA: Diagnosis not present

## 2016-06-30 DIAGNOSIS — G308 Other Alzheimer's disease: Secondary | ICD-10-CM | POA: Diagnosis not present

## 2016-06-30 DIAGNOSIS — R1312 Dysphagia, oropharyngeal phase: Secondary | ICD-10-CM | POA: Diagnosis not present

## 2016-06-30 DIAGNOSIS — K219 Gastro-esophageal reflux disease without esophagitis: Secondary | ICD-10-CM | POA: Diagnosis not present

## 2016-07-01 DIAGNOSIS — R1312 Dysphagia, oropharyngeal phase: Secondary | ICD-10-CM | POA: Diagnosis not present

## 2016-07-01 DIAGNOSIS — G308 Other Alzheimer's disease: Secondary | ICD-10-CM | POA: Diagnosis not present

## 2016-07-01 DIAGNOSIS — K219 Gastro-esophageal reflux disease without esophagitis: Secondary | ICD-10-CM | POA: Diagnosis not present

## 2016-07-02 DIAGNOSIS — S8011XA Contusion of right lower leg, initial encounter: Secondary | ICD-10-CM | POA: Diagnosis not present

## 2016-07-06 DIAGNOSIS — G308 Other Alzheimer's disease: Secondary | ICD-10-CM | POA: Diagnosis not present

## 2016-07-06 DIAGNOSIS — K219 Gastro-esophageal reflux disease without esophagitis: Secondary | ICD-10-CM | POA: Diagnosis not present

## 2016-07-06 DIAGNOSIS — R1312 Dysphagia, oropharyngeal phase: Secondary | ICD-10-CM | POA: Diagnosis not present

## 2016-07-09 DIAGNOSIS — R1312 Dysphagia, oropharyngeal phase: Secondary | ICD-10-CM | POA: Diagnosis not present

## 2016-07-09 DIAGNOSIS — G308 Other Alzheimer's disease: Secondary | ICD-10-CM | POA: Diagnosis not present

## 2016-07-09 DIAGNOSIS — K219 Gastro-esophageal reflux disease without esophagitis: Secondary | ICD-10-CM | POA: Diagnosis not present

## 2016-07-12 DIAGNOSIS — R1312 Dysphagia, oropharyngeal phase: Secondary | ICD-10-CM | POA: Diagnosis not present

## 2016-07-12 DIAGNOSIS — G308 Other Alzheimer's disease: Secondary | ICD-10-CM | POA: Diagnosis not present

## 2016-07-12 DIAGNOSIS — K219 Gastro-esophageal reflux disease without esophagitis: Secondary | ICD-10-CM | POA: Diagnosis not present

## 2016-07-13 DIAGNOSIS — F3289 Other specified depressive episodes: Secondary | ICD-10-CM | POA: Diagnosis not present

## 2016-07-14 DIAGNOSIS — K219 Gastro-esophageal reflux disease without esophagitis: Secondary | ICD-10-CM | POA: Diagnosis not present

## 2016-07-14 DIAGNOSIS — R1312 Dysphagia, oropharyngeal phase: Secondary | ICD-10-CM | POA: Diagnosis not present

## 2016-07-14 DIAGNOSIS — G308 Other Alzheimer's disease: Secondary | ICD-10-CM | POA: Diagnosis not present

## 2016-07-16 DIAGNOSIS — G308 Other Alzheimer's disease: Secondary | ICD-10-CM | POA: Diagnosis not present

## 2016-07-16 DIAGNOSIS — K219 Gastro-esophageal reflux disease without esophagitis: Secondary | ICD-10-CM | POA: Diagnosis not present

## 2016-07-16 DIAGNOSIS — R1312 Dysphagia, oropharyngeal phase: Secondary | ICD-10-CM | POA: Diagnosis not present

## 2016-08-31 DIAGNOSIS — N390 Urinary tract infection, site not specified: Secondary | ICD-10-CM | POA: Diagnosis not present

## 2016-08-31 DIAGNOSIS — R319 Hematuria, unspecified: Secondary | ICD-10-CM | POA: Diagnosis not present

## 2016-09-09 DIAGNOSIS — E119 Type 2 diabetes mellitus without complications: Secondary | ICD-10-CM | POA: Diagnosis not present

## 2016-09-09 DIAGNOSIS — I10 Essential (primary) hypertension: Secondary | ICD-10-CM | POA: Diagnosis not present

## 2016-09-09 DIAGNOSIS — G301 Alzheimer's disease with late onset: Secondary | ICD-10-CM | POA: Diagnosis not present

## 2016-09-09 DIAGNOSIS — F325 Major depressive disorder, single episode, in full remission: Secondary | ICD-10-CM | POA: Diagnosis not present

## 2016-10-31 DIAGNOSIS — N183 Chronic kidney disease, stage 3 (moderate): Secondary | ICD-10-CM | POA: Diagnosis not present

## 2016-10-31 DIAGNOSIS — F325 Major depressive disorder, single episode, in full remission: Secondary | ICD-10-CM | POA: Diagnosis not present

## 2016-10-31 DIAGNOSIS — I129 Hypertensive chronic kidney disease with stage 1 through stage 4 chronic kidney disease, or unspecified chronic kidney disease: Secondary | ICD-10-CM | POA: Diagnosis not present

## 2016-10-31 DIAGNOSIS — G301 Alzheimer's disease with late onset: Secondary | ICD-10-CM | POA: Diagnosis not present

## 2016-11-10 DIAGNOSIS — R410 Disorientation, unspecified: Secondary | ICD-10-CM | POA: Diagnosis not present

## 2016-11-10 DIAGNOSIS — R319 Hematuria, unspecified: Secondary | ICD-10-CM | POA: Diagnosis not present

## 2016-11-10 DIAGNOSIS — N390 Urinary tract infection, site not specified: Secondary | ICD-10-CM | POA: Diagnosis not present

## 2016-11-12 DIAGNOSIS — R2681 Unsteadiness on feet: Secondary | ICD-10-CM | POA: Diagnosis not present

## 2016-11-12 DIAGNOSIS — R1312 Dysphagia, oropharyngeal phase: Secondary | ICD-10-CM | POA: Diagnosis not present

## 2016-11-12 DIAGNOSIS — Z9181 History of falling: Secondary | ICD-10-CM | POA: Diagnosis not present

## 2016-11-15 DIAGNOSIS — Z9181 History of falling: Secondary | ICD-10-CM | POA: Diagnosis not present

## 2016-11-15 DIAGNOSIS — R1312 Dysphagia, oropharyngeal phase: Secondary | ICD-10-CM | POA: Diagnosis not present

## 2016-11-15 DIAGNOSIS — R2681 Unsteadiness on feet: Secondary | ICD-10-CM | POA: Diagnosis not present

## 2016-11-17 DIAGNOSIS — R2681 Unsteadiness on feet: Secondary | ICD-10-CM | POA: Diagnosis not present

## 2016-11-17 DIAGNOSIS — Z9181 History of falling: Secondary | ICD-10-CM | POA: Diagnosis not present

## 2016-11-17 DIAGNOSIS — R1312 Dysphagia, oropharyngeal phase: Secondary | ICD-10-CM | POA: Diagnosis not present

## 2016-11-18 DIAGNOSIS — Z9181 History of falling: Secondary | ICD-10-CM | POA: Diagnosis not present

## 2016-11-18 DIAGNOSIS — R2681 Unsteadiness on feet: Secondary | ICD-10-CM | POA: Diagnosis not present

## 2016-11-18 DIAGNOSIS — R1312 Dysphagia, oropharyngeal phase: Secondary | ICD-10-CM | POA: Diagnosis not present

## 2016-11-19 DIAGNOSIS — R2681 Unsteadiness on feet: Secondary | ICD-10-CM | POA: Diagnosis not present

## 2016-11-19 DIAGNOSIS — R1312 Dysphagia, oropharyngeal phase: Secondary | ICD-10-CM | POA: Diagnosis not present

## 2016-11-19 DIAGNOSIS — Z9181 History of falling: Secondary | ICD-10-CM | POA: Diagnosis not present

## 2016-11-22 DIAGNOSIS — R1312 Dysphagia, oropharyngeal phase: Secondary | ICD-10-CM | POA: Diagnosis not present

## 2016-11-22 DIAGNOSIS — R2681 Unsteadiness on feet: Secondary | ICD-10-CM | POA: Diagnosis not present

## 2016-11-22 DIAGNOSIS — Z9181 History of falling: Secondary | ICD-10-CM | POA: Diagnosis not present

## 2016-11-24 DIAGNOSIS — R1312 Dysphagia, oropharyngeal phase: Secondary | ICD-10-CM | POA: Diagnosis not present

## 2016-11-24 DIAGNOSIS — Z9181 History of falling: Secondary | ICD-10-CM | POA: Diagnosis not present

## 2016-11-24 DIAGNOSIS — R2681 Unsteadiness on feet: Secondary | ICD-10-CM | POA: Diagnosis not present

## 2016-11-25 DIAGNOSIS — R2681 Unsteadiness on feet: Secondary | ICD-10-CM | POA: Diagnosis not present

## 2016-11-25 DIAGNOSIS — R1312 Dysphagia, oropharyngeal phase: Secondary | ICD-10-CM | POA: Diagnosis not present

## 2016-11-25 DIAGNOSIS — Z9181 History of falling: Secondary | ICD-10-CM | POA: Diagnosis not present

## 2016-11-26 DIAGNOSIS — R1312 Dysphagia, oropharyngeal phase: Secondary | ICD-10-CM | POA: Diagnosis not present

## 2016-11-26 DIAGNOSIS — Z9181 History of falling: Secondary | ICD-10-CM | POA: Diagnosis not present

## 2016-11-26 DIAGNOSIS — R2681 Unsteadiness on feet: Secondary | ICD-10-CM | POA: Diagnosis not present

## 2016-12-02 DIAGNOSIS — D649 Anemia, unspecified: Secondary | ICD-10-CM | POA: Diagnosis not present

## 2016-12-02 DIAGNOSIS — E119 Type 2 diabetes mellitus without complications: Secondary | ICD-10-CM | POA: Diagnosis not present

## 2016-12-17 DIAGNOSIS — R319 Hematuria, unspecified: Secondary | ICD-10-CM | POA: Diagnosis not present

## 2016-12-17 DIAGNOSIS — R4182 Altered mental status, unspecified: Secondary | ICD-10-CM | POA: Diagnosis not present

## 2017-01-09 DIAGNOSIS — E119 Type 2 diabetes mellitus without complications: Secondary | ICD-10-CM | POA: Diagnosis not present

## 2017-01-09 DIAGNOSIS — G301 Alzheimer's disease with late onset: Secondary | ICD-10-CM | POA: Diagnosis not present

## 2017-01-09 DIAGNOSIS — F325 Major depressive disorder, single episode, in full remission: Secondary | ICD-10-CM | POA: Diagnosis not present

## 2017-01-26 DIAGNOSIS — K219 Gastro-esophageal reflux disease without esophagitis: Secondary | ICD-10-CM | POA: Diagnosis not present

## 2017-01-26 DIAGNOSIS — M1990 Unspecified osteoarthritis, unspecified site: Secondary | ICD-10-CM | POA: Diagnosis not present

## 2017-01-26 DIAGNOSIS — Z9181 History of falling: Secondary | ICD-10-CM | POA: Diagnosis not present

## 2017-01-26 DIAGNOSIS — R2681 Unsteadiness on feet: Secondary | ICD-10-CM | POA: Diagnosis not present

## 2017-01-26 DIAGNOSIS — W14XXXA Fall from tree, initial encounter: Secondary | ICD-10-CM | POA: Diagnosis not present

## 2017-01-26 DIAGNOSIS — R1312 Dysphagia, oropharyngeal phase: Secondary | ICD-10-CM | POA: Diagnosis not present

## 2017-01-26 DIAGNOSIS — G308 Other Alzheimer's disease: Secondary | ICD-10-CM | POA: Diagnosis not present

## 2017-01-27 DIAGNOSIS — M1990 Unspecified osteoarthritis, unspecified site: Secondary | ICD-10-CM | POA: Diagnosis not present

## 2017-01-27 DIAGNOSIS — W14XXXA Fall from tree, initial encounter: Secondary | ICD-10-CM | POA: Diagnosis not present

## 2017-01-27 DIAGNOSIS — K219 Gastro-esophageal reflux disease without esophagitis: Secondary | ICD-10-CM | POA: Diagnosis not present

## 2017-01-27 DIAGNOSIS — R2681 Unsteadiness on feet: Secondary | ICD-10-CM | POA: Diagnosis not present

## 2017-01-27 DIAGNOSIS — G308 Other Alzheimer's disease: Secondary | ICD-10-CM | POA: Diagnosis not present

## 2017-01-27 DIAGNOSIS — Z9181 History of falling: Secondary | ICD-10-CM | POA: Diagnosis not present

## 2017-01-28 DIAGNOSIS — R2681 Unsteadiness on feet: Secondary | ICD-10-CM | POA: Diagnosis not present

## 2017-01-28 DIAGNOSIS — K219 Gastro-esophageal reflux disease without esophagitis: Secondary | ICD-10-CM | POA: Diagnosis not present

## 2017-01-28 DIAGNOSIS — R410 Disorientation, unspecified: Secondary | ICD-10-CM | POA: Diagnosis not present

## 2017-01-28 DIAGNOSIS — G308 Other Alzheimer's disease: Secondary | ICD-10-CM | POA: Diagnosis not present

## 2017-01-28 DIAGNOSIS — D649 Anemia, unspecified: Secondary | ICD-10-CM | POA: Diagnosis not present

## 2017-01-28 DIAGNOSIS — W14XXXA Fall from tree, initial encounter: Secondary | ICD-10-CM | POA: Diagnosis not present

## 2017-01-28 DIAGNOSIS — M1990 Unspecified osteoarthritis, unspecified site: Secondary | ICD-10-CM | POA: Diagnosis not present

## 2017-01-28 DIAGNOSIS — Z9181 History of falling: Secondary | ICD-10-CM | POA: Diagnosis not present

## 2017-01-28 DIAGNOSIS — R569 Unspecified convulsions: Secondary | ICD-10-CM | POA: Diagnosis not present

## 2017-01-28 DIAGNOSIS — R4182 Altered mental status, unspecified: Secondary | ICD-10-CM | POA: Diagnosis not present

## 2017-01-28 DIAGNOSIS — I10 Essential (primary) hypertension: Secondary | ICD-10-CM | POA: Diagnosis not present

## 2017-01-31 DIAGNOSIS — W14XXXA Fall from tree, initial encounter: Secondary | ICD-10-CM | POA: Diagnosis not present

## 2017-01-31 DIAGNOSIS — Z9181 History of falling: Secondary | ICD-10-CM | POA: Diagnosis not present

## 2017-01-31 DIAGNOSIS — R2681 Unsteadiness on feet: Secondary | ICD-10-CM | POA: Diagnosis not present

## 2017-01-31 DIAGNOSIS — K219 Gastro-esophageal reflux disease without esophagitis: Secondary | ICD-10-CM | POA: Diagnosis not present

## 2017-01-31 DIAGNOSIS — N390 Urinary tract infection, site not specified: Secondary | ICD-10-CM | POA: Diagnosis not present

## 2017-01-31 DIAGNOSIS — R319 Hematuria, unspecified: Secondary | ICD-10-CM | POA: Diagnosis not present

## 2017-01-31 DIAGNOSIS — G308 Other Alzheimer's disease: Secondary | ICD-10-CM | POA: Diagnosis not present

## 2017-01-31 DIAGNOSIS — M1990 Unspecified osteoarthritis, unspecified site: Secondary | ICD-10-CM | POA: Diagnosis not present

## 2017-02-01 DIAGNOSIS — M1990 Unspecified osteoarthritis, unspecified site: Secondary | ICD-10-CM | POA: Diagnosis not present

## 2017-02-01 DIAGNOSIS — W14XXXA Fall from tree, initial encounter: Secondary | ICD-10-CM | POA: Diagnosis not present

## 2017-02-01 DIAGNOSIS — Z9181 History of falling: Secondary | ICD-10-CM | POA: Diagnosis not present

## 2017-02-01 DIAGNOSIS — R2681 Unsteadiness on feet: Secondary | ICD-10-CM | POA: Diagnosis not present

## 2017-02-01 DIAGNOSIS — K219 Gastro-esophageal reflux disease without esophagitis: Secondary | ICD-10-CM | POA: Diagnosis not present

## 2017-02-01 DIAGNOSIS — G308 Other Alzheimer's disease: Secondary | ICD-10-CM | POA: Diagnosis not present

## 2017-02-02 DIAGNOSIS — G308 Other Alzheimer's disease: Secondary | ICD-10-CM | POA: Diagnosis not present

## 2017-02-02 DIAGNOSIS — W14XXXA Fall from tree, initial encounter: Secondary | ICD-10-CM | POA: Diagnosis not present

## 2017-02-02 DIAGNOSIS — R2681 Unsteadiness on feet: Secondary | ICD-10-CM | POA: Diagnosis not present

## 2017-02-02 DIAGNOSIS — M1990 Unspecified osteoarthritis, unspecified site: Secondary | ICD-10-CM | POA: Diagnosis not present

## 2017-02-02 DIAGNOSIS — K219 Gastro-esophageal reflux disease without esophagitis: Secondary | ICD-10-CM | POA: Diagnosis not present

## 2017-02-02 DIAGNOSIS — Z9181 History of falling: Secondary | ICD-10-CM | POA: Diagnosis not present

## 2017-02-03 DIAGNOSIS — Z9181 History of falling: Secondary | ICD-10-CM | POA: Diagnosis not present

## 2017-02-03 DIAGNOSIS — G308 Other Alzheimer's disease: Secondary | ICD-10-CM | POA: Diagnosis not present

## 2017-02-03 DIAGNOSIS — W14XXXA Fall from tree, initial encounter: Secondary | ICD-10-CM | POA: Diagnosis not present

## 2017-02-03 DIAGNOSIS — R2681 Unsteadiness on feet: Secondary | ICD-10-CM | POA: Diagnosis not present

## 2017-02-03 DIAGNOSIS — M1990 Unspecified osteoarthritis, unspecified site: Secondary | ICD-10-CM | POA: Diagnosis not present

## 2017-02-03 DIAGNOSIS — K219 Gastro-esophageal reflux disease without esophagitis: Secondary | ICD-10-CM | POA: Diagnosis not present

## 2017-02-04 DIAGNOSIS — G308 Other Alzheimer's disease: Secondary | ICD-10-CM | POA: Diagnosis not present

## 2017-02-04 DIAGNOSIS — R2681 Unsteadiness on feet: Secondary | ICD-10-CM | POA: Diagnosis not present

## 2017-02-04 DIAGNOSIS — M1990 Unspecified osteoarthritis, unspecified site: Secondary | ICD-10-CM | POA: Diagnosis not present

## 2017-02-04 DIAGNOSIS — Z9181 History of falling: Secondary | ICD-10-CM | POA: Diagnosis not present

## 2017-02-04 DIAGNOSIS — W14XXXA Fall from tree, initial encounter: Secondary | ICD-10-CM | POA: Diagnosis not present

## 2017-02-04 DIAGNOSIS — K219 Gastro-esophageal reflux disease without esophagitis: Secondary | ICD-10-CM | POA: Diagnosis not present

## 2017-02-07 ENCOUNTER — Other Ambulatory Visit: Payer: Self-pay | Admitting: Geriatric Medicine

## 2017-02-07 DIAGNOSIS — W14XXXA Fall from tree, initial encounter: Secondary | ICD-10-CM | POA: Diagnosis not present

## 2017-02-07 DIAGNOSIS — G308 Other Alzheimer's disease: Secondary | ICD-10-CM | POA: Diagnosis not present

## 2017-02-07 DIAGNOSIS — M1990 Unspecified osteoarthritis, unspecified site: Secondary | ICD-10-CM | POA: Diagnosis not present

## 2017-02-07 DIAGNOSIS — R2681 Unsteadiness on feet: Secondary | ICD-10-CM | POA: Diagnosis not present

## 2017-02-07 DIAGNOSIS — K219 Gastro-esophageal reflux disease without esophagitis: Secondary | ICD-10-CM | POA: Diagnosis not present

## 2017-02-07 DIAGNOSIS — Z9181 History of falling: Secondary | ICD-10-CM | POA: Diagnosis not present

## 2017-02-08 DIAGNOSIS — W14XXXA Fall from tree, initial encounter: Secondary | ICD-10-CM | POA: Diagnosis not present

## 2017-02-08 DIAGNOSIS — K219 Gastro-esophageal reflux disease without esophagitis: Secondary | ICD-10-CM | POA: Diagnosis not present

## 2017-02-08 DIAGNOSIS — G308 Other Alzheimer's disease: Secondary | ICD-10-CM | POA: Diagnosis not present

## 2017-02-08 DIAGNOSIS — M1990 Unspecified osteoarthritis, unspecified site: Secondary | ICD-10-CM | POA: Diagnosis not present

## 2017-02-08 DIAGNOSIS — Z9181 History of falling: Secondary | ICD-10-CM | POA: Diagnosis not present

## 2017-02-08 DIAGNOSIS — R2681 Unsteadiness on feet: Secondary | ICD-10-CM | POA: Diagnosis not present

## 2017-02-09 ENCOUNTER — Other Ambulatory Visit: Payer: Self-pay | Admitting: Geriatric Medicine

## 2017-02-09 DIAGNOSIS — M1990 Unspecified osteoarthritis, unspecified site: Secondary | ICD-10-CM | POA: Diagnosis not present

## 2017-02-09 DIAGNOSIS — R109 Unspecified abdominal pain: Secondary | ICD-10-CM

## 2017-02-09 DIAGNOSIS — W14XXXA Fall from tree, initial encounter: Secondary | ICD-10-CM | POA: Diagnosis not present

## 2017-02-09 DIAGNOSIS — K219 Gastro-esophageal reflux disease without esophagitis: Secondary | ICD-10-CM | POA: Diagnosis not present

## 2017-02-09 DIAGNOSIS — Z9181 History of falling: Secondary | ICD-10-CM | POA: Diagnosis not present

## 2017-02-09 DIAGNOSIS — R2681 Unsteadiness on feet: Secondary | ICD-10-CM | POA: Diagnosis not present

## 2017-02-09 DIAGNOSIS — R630 Anorexia: Secondary | ICD-10-CM

## 2017-02-09 DIAGNOSIS — G308 Other Alzheimer's disease: Secondary | ICD-10-CM | POA: Diagnosis not present

## 2017-02-10 DIAGNOSIS — R2681 Unsteadiness on feet: Secondary | ICD-10-CM | POA: Diagnosis not present

## 2017-02-10 DIAGNOSIS — W14XXXA Fall from tree, initial encounter: Secondary | ICD-10-CM | POA: Diagnosis not present

## 2017-02-10 DIAGNOSIS — G308 Other Alzheimer's disease: Secondary | ICD-10-CM | POA: Diagnosis not present

## 2017-02-10 DIAGNOSIS — Z9181 History of falling: Secondary | ICD-10-CM | POA: Diagnosis not present

## 2017-02-10 DIAGNOSIS — K219 Gastro-esophageal reflux disease without esophagitis: Secondary | ICD-10-CM | POA: Diagnosis not present

## 2017-02-10 DIAGNOSIS — M1990 Unspecified osteoarthritis, unspecified site: Secondary | ICD-10-CM | POA: Diagnosis not present

## 2017-02-11 DIAGNOSIS — K219 Gastro-esophageal reflux disease without esophagitis: Secondary | ICD-10-CM | POA: Diagnosis not present

## 2017-02-11 DIAGNOSIS — Z9181 History of falling: Secondary | ICD-10-CM | POA: Diagnosis not present

## 2017-02-11 DIAGNOSIS — W14XXXA Fall from tree, initial encounter: Secondary | ICD-10-CM | POA: Diagnosis not present

## 2017-02-11 DIAGNOSIS — G308 Other Alzheimer's disease: Secondary | ICD-10-CM | POA: Diagnosis not present

## 2017-02-11 DIAGNOSIS — M1990 Unspecified osteoarthritis, unspecified site: Secondary | ICD-10-CM | POA: Diagnosis not present

## 2017-02-11 DIAGNOSIS — R2681 Unsteadiness on feet: Secondary | ICD-10-CM | POA: Diagnosis not present

## 2017-02-15 DIAGNOSIS — K219 Gastro-esophageal reflux disease without esophagitis: Secondary | ICD-10-CM | POA: Diagnosis not present

## 2017-02-15 DIAGNOSIS — G308 Other Alzheimer's disease: Secondary | ICD-10-CM | POA: Diagnosis not present

## 2017-02-15 DIAGNOSIS — M1990 Unspecified osteoarthritis, unspecified site: Secondary | ICD-10-CM | POA: Diagnosis not present

## 2017-02-15 DIAGNOSIS — Z9181 History of falling: Secondary | ICD-10-CM | POA: Diagnosis not present

## 2017-02-15 DIAGNOSIS — W14XXXA Fall from tree, initial encounter: Secondary | ICD-10-CM | POA: Diagnosis not present

## 2017-02-15 DIAGNOSIS — R2681 Unsteadiness on feet: Secondary | ICD-10-CM | POA: Diagnosis not present

## 2017-02-16 DIAGNOSIS — Z9181 History of falling: Secondary | ICD-10-CM | POA: Diagnosis not present

## 2017-02-16 DIAGNOSIS — G308 Other Alzheimer's disease: Secondary | ICD-10-CM | POA: Diagnosis not present

## 2017-02-16 DIAGNOSIS — R2681 Unsteadiness on feet: Secondary | ICD-10-CM | POA: Diagnosis not present

## 2017-02-16 DIAGNOSIS — M1990 Unspecified osteoarthritis, unspecified site: Secondary | ICD-10-CM | POA: Diagnosis not present

## 2017-02-16 DIAGNOSIS — W14XXXA Fall from tree, initial encounter: Secondary | ICD-10-CM | POA: Diagnosis not present

## 2017-02-16 DIAGNOSIS — K219 Gastro-esophageal reflux disease without esophagitis: Secondary | ICD-10-CM | POA: Diagnosis not present

## 2017-02-17 DIAGNOSIS — M1990 Unspecified osteoarthritis, unspecified site: Secondary | ICD-10-CM | POA: Diagnosis not present

## 2017-02-17 DIAGNOSIS — R2681 Unsteadiness on feet: Secondary | ICD-10-CM | POA: Diagnosis not present

## 2017-02-17 DIAGNOSIS — K219 Gastro-esophageal reflux disease without esophagitis: Secondary | ICD-10-CM | POA: Diagnosis not present

## 2017-02-17 DIAGNOSIS — G308 Other Alzheimer's disease: Secondary | ICD-10-CM | POA: Diagnosis not present

## 2017-02-17 DIAGNOSIS — Z9181 History of falling: Secondary | ICD-10-CM | POA: Diagnosis not present

## 2017-02-17 DIAGNOSIS — W14XXXA Fall from tree, initial encounter: Secondary | ICD-10-CM | POA: Diagnosis not present

## 2017-02-18 DIAGNOSIS — R2681 Unsteadiness on feet: Secondary | ICD-10-CM | POA: Diagnosis not present

## 2017-02-18 DIAGNOSIS — M1990 Unspecified osteoarthritis, unspecified site: Secondary | ICD-10-CM | POA: Diagnosis not present

## 2017-02-18 DIAGNOSIS — Z9181 History of falling: Secondary | ICD-10-CM | POA: Diagnosis not present

## 2017-02-18 DIAGNOSIS — G308 Other Alzheimer's disease: Secondary | ICD-10-CM | POA: Diagnosis not present

## 2017-02-18 DIAGNOSIS — W14XXXA Fall from tree, initial encounter: Secondary | ICD-10-CM | POA: Diagnosis not present

## 2017-02-18 DIAGNOSIS — K219 Gastro-esophageal reflux disease without esophagitis: Secondary | ICD-10-CM | POA: Diagnosis not present

## 2017-02-21 DIAGNOSIS — Z9181 History of falling: Secondary | ICD-10-CM | POA: Diagnosis not present

## 2017-02-21 DIAGNOSIS — G308 Other Alzheimer's disease: Secondary | ICD-10-CM | POA: Diagnosis not present

## 2017-02-21 DIAGNOSIS — W14XXXA Fall from tree, initial encounter: Secondary | ICD-10-CM | POA: Diagnosis not present

## 2017-02-21 DIAGNOSIS — R2681 Unsteadiness on feet: Secondary | ICD-10-CM | POA: Diagnosis not present

## 2017-02-21 DIAGNOSIS — K219 Gastro-esophageal reflux disease without esophagitis: Secondary | ICD-10-CM | POA: Diagnosis not present

## 2017-02-21 DIAGNOSIS — M1990 Unspecified osteoarthritis, unspecified site: Secondary | ICD-10-CM | POA: Diagnosis not present

## 2017-02-22 DIAGNOSIS — R2681 Unsteadiness on feet: Secondary | ICD-10-CM | POA: Diagnosis not present

## 2017-02-22 DIAGNOSIS — G308 Other Alzheimer's disease: Secondary | ICD-10-CM | POA: Diagnosis not present

## 2017-02-22 DIAGNOSIS — Z9181 History of falling: Secondary | ICD-10-CM | POA: Diagnosis not present

## 2017-02-22 DIAGNOSIS — M1990 Unspecified osteoarthritis, unspecified site: Secondary | ICD-10-CM | POA: Diagnosis not present

## 2017-02-22 DIAGNOSIS — W14XXXA Fall from tree, initial encounter: Secondary | ICD-10-CM | POA: Diagnosis not present

## 2017-02-22 DIAGNOSIS — K219 Gastro-esophageal reflux disease without esophagitis: Secondary | ICD-10-CM | POA: Diagnosis not present

## 2017-02-24 DIAGNOSIS — R1312 Dysphagia, oropharyngeal phase: Secondary | ICD-10-CM | POA: Diagnosis not present

## 2017-02-24 DIAGNOSIS — Z9181 History of falling: Secondary | ICD-10-CM | POA: Diagnosis not present

## 2017-02-24 DIAGNOSIS — G308 Other Alzheimer's disease: Secondary | ICD-10-CM | POA: Diagnosis not present

## 2017-02-24 DIAGNOSIS — M1990 Unspecified osteoarthritis, unspecified site: Secondary | ICD-10-CM | POA: Diagnosis not present

## 2017-02-24 DIAGNOSIS — K219 Gastro-esophageal reflux disease without esophagitis: Secondary | ICD-10-CM | POA: Diagnosis not present

## 2017-02-24 DIAGNOSIS — W14XXXA Fall from tree, initial encounter: Secondary | ICD-10-CM | POA: Diagnosis not present

## 2017-02-24 DIAGNOSIS — R2681 Unsteadiness on feet: Secondary | ICD-10-CM | POA: Diagnosis not present

## 2017-03-03 DIAGNOSIS — I129 Hypertensive chronic kidney disease with stage 1 through stage 4 chronic kidney disease, or unspecified chronic kidney disease: Secondary | ICD-10-CM | POA: Diagnosis not present

## 2017-03-03 DIAGNOSIS — N183 Chronic kidney disease, stage 3 (moderate): Secondary | ICD-10-CM | POA: Diagnosis not present

## 2017-03-03 DIAGNOSIS — G301 Alzheimer's disease with late onset: Secondary | ICD-10-CM | POA: Diagnosis not present

## 2017-03-03 DIAGNOSIS — F0281 Dementia in other diseases classified elsewhere with behavioral disturbance: Secondary | ICD-10-CM | POA: Diagnosis not present

## 2017-03-03 DIAGNOSIS — F325 Major depressive disorder, single episode, in full remission: Secondary | ICD-10-CM | POA: Diagnosis not present

## 2017-03-03 DIAGNOSIS — E1121 Type 2 diabetes mellitus with diabetic nephropathy: Secondary | ICD-10-CM | POA: Diagnosis not present

## 2017-03-11 DIAGNOSIS — E119 Type 2 diabetes mellitus without complications: Secondary | ICD-10-CM | POA: Diagnosis not present

## 2017-03-31 DIAGNOSIS — I10 Essential (primary) hypertension: Secondary | ICD-10-CM | POA: Diagnosis not present

## 2017-03-31 DIAGNOSIS — H04129 Dry eye syndrome of unspecified lacrimal gland: Secondary | ICD-10-CM | POA: Diagnosis not present

## 2017-03-31 DIAGNOSIS — F064 Anxiety disorder due to known physiological condition: Secondary | ICD-10-CM | POA: Diagnosis not present

## 2017-03-31 DIAGNOSIS — F3289 Other specified depressive episodes: Secondary | ICD-10-CM | POA: Diagnosis not present

## 2017-03-31 DIAGNOSIS — N400 Enlarged prostate without lower urinary tract symptoms: Secondary | ICD-10-CM | POA: Diagnosis not present

## 2017-03-31 DIAGNOSIS — G8929 Other chronic pain: Secondary | ICD-10-CM | POA: Diagnosis not present

## 2017-03-31 DIAGNOSIS — K219 Gastro-esophageal reflux disease without esophagitis: Secondary | ICD-10-CM | POA: Diagnosis not present

## 2017-03-31 DIAGNOSIS — E638 Other specified nutritional deficiencies: Secondary | ICD-10-CM | POA: Diagnosis not present

## 2017-03-31 DIAGNOSIS — K5900 Constipation, unspecified: Secondary | ICD-10-CM | POA: Diagnosis not present

## 2017-03-31 DIAGNOSIS — F5101 Primary insomnia: Secondary | ICD-10-CM | POA: Diagnosis not present

## 2017-03-31 DIAGNOSIS — G308 Other Alzheimer's disease: Secondary | ICD-10-CM | POA: Diagnosis not present

## 2017-04-18 DIAGNOSIS — N401 Enlarged prostate with lower urinary tract symptoms: Secondary | ICD-10-CM | POA: Diagnosis not present

## 2017-04-19 DIAGNOSIS — M6281 Muscle weakness (generalized): Secondary | ICD-10-CM | POA: Diagnosis not present

## 2017-04-19 DIAGNOSIS — G309 Alzheimer's disease, unspecified: Secondary | ICD-10-CM | POA: Diagnosis not present

## 2017-04-19 DIAGNOSIS — N390 Urinary tract infection, site not specified: Secondary | ICD-10-CM | POA: Diagnosis not present

## 2017-04-19 DIAGNOSIS — R41841 Cognitive communication deficit: Secondary | ICD-10-CM | POA: Diagnosis not present

## 2017-04-19 DIAGNOSIS — R319 Hematuria, unspecified: Secondary | ICD-10-CM | POA: Diagnosis not present

## 2017-04-20 DIAGNOSIS — G309 Alzheimer's disease, unspecified: Secondary | ICD-10-CM | POA: Diagnosis not present

## 2017-04-20 DIAGNOSIS — M6281 Muscle weakness (generalized): Secondary | ICD-10-CM | POA: Diagnosis not present

## 2017-04-20 DIAGNOSIS — R41841 Cognitive communication deficit: Secondary | ICD-10-CM | POA: Diagnosis not present

## 2017-04-21 DIAGNOSIS — G309 Alzheimer's disease, unspecified: Secondary | ICD-10-CM | POA: Diagnosis not present

## 2017-04-21 DIAGNOSIS — R41841 Cognitive communication deficit: Secondary | ICD-10-CM | POA: Diagnosis not present

## 2017-04-21 DIAGNOSIS — M6281 Muscle weakness (generalized): Secondary | ICD-10-CM | POA: Diagnosis not present

## 2017-04-22 DIAGNOSIS — G309 Alzheimer's disease, unspecified: Secondary | ICD-10-CM | POA: Diagnosis not present

## 2017-04-22 DIAGNOSIS — R41841 Cognitive communication deficit: Secondary | ICD-10-CM | POA: Diagnosis not present

## 2017-04-22 DIAGNOSIS — M6281 Muscle weakness (generalized): Secondary | ICD-10-CM | POA: Diagnosis not present

## 2017-04-25 DIAGNOSIS — G309 Alzheimer's disease, unspecified: Secondary | ICD-10-CM | POA: Diagnosis not present

## 2017-04-25 DIAGNOSIS — M6281 Muscle weakness (generalized): Secondary | ICD-10-CM | POA: Diagnosis not present

## 2017-04-25 DIAGNOSIS — R41841 Cognitive communication deficit: Secondary | ICD-10-CM | POA: Diagnosis not present

## 2017-04-26 DIAGNOSIS — G309 Alzheimer's disease, unspecified: Secondary | ICD-10-CM | POA: Diagnosis not present

## 2017-04-26 DIAGNOSIS — M6281 Muscle weakness (generalized): Secondary | ICD-10-CM | POA: Diagnosis not present

## 2017-04-26 DIAGNOSIS — R41841 Cognitive communication deficit: Secondary | ICD-10-CM | POA: Diagnosis not present

## 2017-04-27 DIAGNOSIS — M6281 Muscle weakness (generalized): Secondary | ICD-10-CM | POA: Diagnosis not present

## 2017-04-27 DIAGNOSIS — R41841 Cognitive communication deficit: Secondary | ICD-10-CM | POA: Diagnosis not present

## 2017-04-27 DIAGNOSIS — G309 Alzheimer's disease, unspecified: Secondary | ICD-10-CM | POA: Diagnosis not present

## 2017-04-28 DIAGNOSIS — M6281 Muscle weakness (generalized): Secondary | ICD-10-CM | POA: Diagnosis not present

## 2017-04-28 DIAGNOSIS — R41841 Cognitive communication deficit: Secondary | ICD-10-CM | POA: Diagnosis not present

## 2017-04-28 DIAGNOSIS — G309 Alzheimer's disease, unspecified: Secondary | ICD-10-CM | POA: Diagnosis not present

## 2017-04-29 DIAGNOSIS — M6281 Muscle weakness (generalized): Secondary | ICD-10-CM | POA: Diagnosis not present

## 2017-04-29 DIAGNOSIS — G309 Alzheimer's disease, unspecified: Secondary | ICD-10-CM | POA: Diagnosis not present

## 2017-04-29 DIAGNOSIS — R41841 Cognitive communication deficit: Secondary | ICD-10-CM | POA: Diagnosis not present

## 2017-05-02 DIAGNOSIS — M6281 Muscle weakness (generalized): Secondary | ICD-10-CM | POA: Diagnosis not present

## 2017-05-02 DIAGNOSIS — G309 Alzheimer's disease, unspecified: Secondary | ICD-10-CM | POA: Diagnosis not present

## 2017-05-02 DIAGNOSIS — R41841 Cognitive communication deficit: Secondary | ICD-10-CM | POA: Diagnosis not present

## 2017-05-03 DIAGNOSIS — M6281 Muscle weakness (generalized): Secondary | ICD-10-CM | POA: Diagnosis not present

## 2017-05-03 DIAGNOSIS — R41841 Cognitive communication deficit: Secondary | ICD-10-CM | POA: Diagnosis not present

## 2017-05-03 DIAGNOSIS — G309 Alzheimer's disease, unspecified: Secondary | ICD-10-CM | POA: Diagnosis not present

## 2017-05-04 DIAGNOSIS — M6281 Muscle weakness (generalized): Secondary | ICD-10-CM | POA: Diagnosis not present

## 2017-05-04 DIAGNOSIS — G309 Alzheimer's disease, unspecified: Secondary | ICD-10-CM | POA: Diagnosis not present

## 2017-05-04 DIAGNOSIS — R41841 Cognitive communication deficit: Secondary | ICD-10-CM | POA: Diagnosis not present

## 2017-05-05 DIAGNOSIS — F5101 Primary insomnia: Secondary | ICD-10-CM | POA: Diagnosis not present

## 2017-05-05 DIAGNOSIS — G309 Alzheimer's disease, unspecified: Secondary | ICD-10-CM | POA: Diagnosis not present

## 2017-05-05 DIAGNOSIS — E638 Other specified nutritional deficiencies: Secondary | ICD-10-CM | POA: Diagnosis not present

## 2017-05-05 DIAGNOSIS — R41841 Cognitive communication deficit: Secondary | ICD-10-CM | POA: Diagnosis not present

## 2017-05-05 DIAGNOSIS — G308 Other Alzheimer's disease: Secondary | ICD-10-CM | POA: Diagnosis not present

## 2017-05-05 DIAGNOSIS — F064 Anxiety disorder due to known physiological condition: Secondary | ICD-10-CM | POA: Diagnosis not present

## 2017-05-05 DIAGNOSIS — K5900 Constipation, unspecified: Secondary | ICD-10-CM | POA: Diagnosis not present

## 2017-05-05 DIAGNOSIS — G8929 Other chronic pain: Secondary | ICD-10-CM | POA: Diagnosis not present

## 2017-05-05 DIAGNOSIS — R54 Age-related physical debility: Secondary | ICD-10-CM | POA: Diagnosis not present

## 2017-05-05 DIAGNOSIS — N400 Enlarged prostate without lower urinary tract symptoms: Secondary | ICD-10-CM | POA: Diagnosis not present

## 2017-05-05 DIAGNOSIS — K219 Gastro-esophageal reflux disease without esophagitis: Secondary | ICD-10-CM | POA: Diagnosis not present

## 2017-05-05 DIAGNOSIS — I10 Essential (primary) hypertension: Secondary | ICD-10-CM | POA: Diagnosis not present

## 2017-05-05 DIAGNOSIS — H04129 Dry eye syndrome of unspecified lacrimal gland: Secondary | ICD-10-CM | POA: Diagnosis not present

## 2017-05-05 DIAGNOSIS — M6281 Muscle weakness (generalized): Secondary | ICD-10-CM | POA: Diagnosis not present

## 2017-05-05 DIAGNOSIS — F3289 Other specified depressive episodes: Secondary | ICD-10-CM | POA: Diagnosis not present

## 2017-05-06 DIAGNOSIS — G309 Alzheimer's disease, unspecified: Secondary | ICD-10-CM | POA: Diagnosis not present

## 2017-05-06 DIAGNOSIS — M6281 Muscle weakness (generalized): Secondary | ICD-10-CM | POA: Diagnosis not present

## 2017-05-06 DIAGNOSIS — R41841 Cognitive communication deficit: Secondary | ICD-10-CM | POA: Diagnosis not present

## 2017-05-09 DIAGNOSIS — R41841 Cognitive communication deficit: Secondary | ICD-10-CM | POA: Diagnosis not present

## 2017-05-09 DIAGNOSIS — M6281 Muscle weakness (generalized): Secondary | ICD-10-CM | POA: Diagnosis not present

## 2017-05-09 DIAGNOSIS — G309 Alzheimer's disease, unspecified: Secondary | ICD-10-CM | POA: Diagnosis not present

## 2017-05-10 DIAGNOSIS — M6281 Muscle weakness (generalized): Secondary | ICD-10-CM | POA: Diagnosis not present

## 2017-05-10 DIAGNOSIS — R41841 Cognitive communication deficit: Secondary | ICD-10-CM | POA: Diagnosis not present

## 2017-05-10 DIAGNOSIS — G309 Alzheimer's disease, unspecified: Secondary | ICD-10-CM | POA: Diagnosis not present

## 2017-05-13 DIAGNOSIS — M6281 Muscle weakness (generalized): Secondary | ICD-10-CM | POA: Diagnosis not present

## 2017-05-13 DIAGNOSIS — G309 Alzheimer's disease, unspecified: Secondary | ICD-10-CM | POA: Diagnosis not present

## 2017-05-13 DIAGNOSIS — R41841 Cognitive communication deficit: Secondary | ICD-10-CM | POA: Diagnosis not present

## 2017-05-16 DIAGNOSIS — R41841 Cognitive communication deficit: Secondary | ICD-10-CM | POA: Diagnosis not present

## 2017-05-16 DIAGNOSIS — G309 Alzheimer's disease, unspecified: Secondary | ICD-10-CM | POA: Diagnosis not present

## 2017-05-16 DIAGNOSIS — M6281 Muscle weakness (generalized): Secondary | ICD-10-CM | POA: Diagnosis not present

## 2017-05-17 DIAGNOSIS — M6281 Muscle weakness (generalized): Secondary | ICD-10-CM | POA: Diagnosis not present

## 2017-05-17 DIAGNOSIS — R41841 Cognitive communication deficit: Secondary | ICD-10-CM | POA: Diagnosis not present

## 2017-05-17 DIAGNOSIS — G309 Alzheimer's disease, unspecified: Secondary | ICD-10-CM | POA: Diagnosis not present

## 2017-05-18 DIAGNOSIS — G309 Alzheimer's disease, unspecified: Secondary | ICD-10-CM | POA: Diagnosis not present

## 2017-05-18 DIAGNOSIS — M6281 Muscle weakness (generalized): Secondary | ICD-10-CM | POA: Diagnosis not present

## 2017-05-18 DIAGNOSIS — R41841 Cognitive communication deficit: Secondary | ICD-10-CM | POA: Diagnosis not present

## 2017-05-19 DIAGNOSIS — R41841 Cognitive communication deficit: Secondary | ICD-10-CM | POA: Diagnosis not present

## 2017-05-19 DIAGNOSIS — M6281 Muscle weakness (generalized): Secondary | ICD-10-CM | POA: Diagnosis not present

## 2017-05-19 DIAGNOSIS — G309 Alzheimer's disease, unspecified: Secondary | ICD-10-CM | POA: Diagnosis not present

## 2017-06-01 DIAGNOSIS — F064 Anxiety disorder due to known physiological condition: Secondary | ICD-10-CM | POA: Diagnosis not present

## 2017-06-01 DIAGNOSIS — G8929 Other chronic pain: Secondary | ICD-10-CM | POA: Diagnosis not present

## 2017-06-01 DIAGNOSIS — M6281 Muscle weakness (generalized): Secondary | ICD-10-CM | POA: Diagnosis not present

## 2017-06-01 DIAGNOSIS — N400 Enlarged prostate without lower urinary tract symptoms: Secondary | ICD-10-CM | POA: Diagnosis not present

## 2017-06-01 DIAGNOSIS — K5900 Constipation, unspecified: Secondary | ICD-10-CM | POA: Diagnosis not present

## 2017-06-01 DIAGNOSIS — K219 Gastro-esophageal reflux disease without esophagitis: Secondary | ICD-10-CM | POA: Diagnosis not present

## 2017-06-01 DIAGNOSIS — F5101 Primary insomnia: Secondary | ICD-10-CM | POA: Diagnosis not present

## 2017-06-01 DIAGNOSIS — I10 Essential (primary) hypertension: Secondary | ICD-10-CM | POA: Diagnosis not present

## 2017-06-01 DIAGNOSIS — G308 Other Alzheimer's disease: Secondary | ICD-10-CM | POA: Diagnosis not present

## 2017-06-01 DIAGNOSIS — H04129 Dry eye syndrome of unspecified lacrimal gland: Secondary | ICD-10-CM | POA: Diagnosis not present

## 2017-06-01 DIAGNOSIS — E638 Other specified nutritional deficiencies: Secondary | ICD-10-CM | POA: Diagnosis not present

## 2017-06-01 DIAGNOSIS — F3289 Other specified depressive episodes: Secondary | ICD-10-CM | POA: Diagnosis not present

## 2017-06-02 DIAGNOSIS — I10 Essential (primary) hypertension: Secondary | ICD-10-CM | POA: Diagnosis not present

## 2017-06-02 DIAGNOSIS — D649 Anemia, unspecified: Secondary | ICD-10-CM | POA: Diagnosis not present

## 2017-06-03 DIAGNOSIS — R300 Dysuria: Secondary | ICD-10-CM | POA: Diagnosis not present

## 2017-06-03 DIAGNOSIS — N4889 Other specified disorders of penis: Secondary | ICD-10-CM | POA: Diagnosis not present

## 2017-06-03 DIAGNOSIS — R1030 Lower abdominal pain, unspecified: Secondary | ICD-10-CM | POA: Diagnosis not present

## 2017-06-04 DIAGNOSIS — R1030 Lower abdominal pain, unspecified: Secondary | ICD-10-CM | POA: Diagnosis not present

## 2017-06-04 DIAGNOSIS — N390 Urinary tract infection, site not specified: Secondary | ICD-10-CM | POA: Diagnosis not present

## 2017-06-04 DIAGNOSIS — R319 Hematuria, unspecified: Secondary | ICD-10-CM | POA: Diagnosis not present

## 2017-06-06 DIAGNOSIS — R300 Dysuria: Secondary | ICD-10-CM | POA: Diagnosis not present

## 2017-06-06 DIAGNOSIS — R1030 Lower abdominal pain, unspecified: Secondary | ICD-10-CM | POA: Diagnosis not present

## 2017-06-06 DIAGNOSIS — N4889 Other specified disorders of penis: Secondary | ICD-10-CM | POA: Diagnosis not present

## 2017-06-29 DIAGNOSIS — R1312 Dysphagia, oropharyngeal phase: Secondary | ICD-10-CM | POA: Diagnosis not present

## 2017-06-29 DIAGNOSIS — G309 Alzheimer's disease, unspecified: Secondary | ICD-10-CM | POA: Diagnosis not present

## 2017-06-30 DIAGNOSIS — G309 Alzheimer's disease, unspecified: Secondary | ICD-10-CM | POA: Diagnosis not present

## 2017-06-30 DIAGNOSIS — R1312 Dysphagia, oropharyngeal phase: Secondary | ICD-10-CM | POA: Diagnosis not present

## 2017-07-01 DIAGNOSIS — K219 Gastro-esophageal reflux disease without esophagitis: Secondary | ICD-10-CM | POA: Diagnosis not present

## 2017-07-01 DIAGNOSIS — F064 Anxiety disorder due to known physiological condition: Secondary | ICD-10-CM | POA: Diagnosis not present

## 2017-07-01 DIAGNOSIS — F5101 Primary insomnia: Secondary | ICD-10-CM | POA: Diagnosis not present

## 2017-07-01 DIAGNOSIS — K5900 Constipation, unspecified: Secondary | ICD-10-CM | POA: Diagnosis not present

## 2017-07-01 DIAGNOSIS — G8929 Other chronic pain: Secondary | ICD-10-CM | POA: Diagnosis not present

## 2017-07-01 DIAGNOSIS — H04129 Dry eye syndrome of unspecified lacrimal gland: Secondary | ICD-10-CM | POA: Diagnosis not present

## 2017-07-01 DIAGNOSIS — R1312 Dysphagia, oropharyngeal phase: Secondary | ICD-10-CM | POA: Diagnosis not present

## 2017-07-01 DIAGNOSIS — G309 Alzheimer's disease, unspecified: Secondary | ICD-10-CM | POA: Diagnosis not present

## 2017-07-01 DIAGNOSIS — G308 Other Alzheimer's disease: Secondary | ICD-10-CM | POA: Diagnosis not present

## 2017-07-01 DIAGNOSIS — I10 Essential (primary) hypertension: Secondary | ICD-10-CM | POA: Diagnosis not present

## 2017-07-01 DIAGNOSIS — M6281 Muscle weakness (generalized): Secondary | ICD-10-CM | POA: Diagnosis not present

## 2017-07-01 DIAGNOSIS — N400 Enlarged prostate without lower urinary tract symptoms: Secondary | ICD-10-CM | POA: Diagnosis not present

## 2017-07-01 DIAGNOSIS — F3289 Other specified depressive episodes: Secondary | ICD-10-CM | POA: Diagnosis not present

## 2017-07-01 DIAGNOSIS — E638 Other specified nutritional deficiencies: Secondary | ICD-10-CM | POA: Diagnosis not present

## 2017-07-06 DIAGNOSIS — G309 Alzheimer's disease, unspecified: Secondary | ICD-10-CM | POA: Diagnosis not present

## 2017-07-06 DIAGNOSIS — R1312 Dysphagia, oropharyngeal phase: Secondary | ICD-10-CM | POA: Diagnosis not present

## 2017-07-07 DIAGNOSIS — G309 Alzheimer's disease, unspecified: Secondary | ICD-10-CM | POA: Diagnosis not present

## 2017-07-07 DIAGNOSIS — R1312 Dysphagia, oropharyngeal phase: Secondary | ICD-10-CM | POA: Diagnosis not present

## 2017-08-05 DIAGNOSIS — K5900 Constipation, unspecified: Secondary | ICD-10-CM | POA: Diagnosis not present

## 2017-08-05 DIAGNOSIS — I10 Essential (primary) hypertension: Secondary | ICD-10-CM | POA: Diagnosis not present

## 2017-08-05 DIAGNOSIS — L601 Onycholysis: Secondary | ICD-10-CM | POA: Diagnosis not present

## 2017-08-05 DIAGNOSIS — G308 Other Alzheimer's disease: Secondary | ICD-10-CM | POA: Diagnosis not present

## 2017-08-05 DIAGNOSIS — N400 Enlarged prostate without lower urinary tract symptoms: Secondary | ICD-10-CM | POA: Diagnosis not present

## 2017-08-05 DIAGNOSIS — E638 Other specified nutritional deficiencies: Secondary | ICD-10-CM | POA: Diagnosis not present

## 2017-08-05 DIAGNOSIS — F5101 Primary insomnia: Secondary | ICD-10-CM | POA: Diagnosis not present

## 2017-08-05 DIAGNOSIS — K219 Gastro-esophageal reflux disease without esophagitis: Secondary | ICD-10-CM | POA: Diagnosis not present

## 2017-08-05 DIAGNOSIS — H04129 Dry eye syndrome of unspecified lacrimal gland: Secondary | ICD-10-CM | POA: Diagnosis not present

## 2017-08-05 DIAGNOSIS — Q845 Enlarged and hypertrophic nails: Secondary | ICD-10-CM | POA: Diagnosis not present

## 2017-08-05 DIAGNOSIS — G8929 Other chronic pain: Secondary | ICD-10-CM | POA: Diagnosis not present

## 2017-08-05 DIAGNOSIS — M6281 Muscle weakness (generalized): Secondary | ICD-10-CM | POA: Diagnosis not present

## 2017-08-06 DIAGNOSIS — D649 Anemia, unspecified: Secondary | ICD-10-CM | POA: Diagnosis not present

## 2017-08-06 DIAGNOSIS — R7989 Other specified abnormal findings of blood chemistry: Secondary | ICD-10-CM | POA: Diagnosis not present

## 2017-08-06 DIAGNOSIS — R569 Unspecified convulsions: Secondary | ICD-10-CM | POA: Diagnosis not present

## 2017-08-06 DIAGNOSIS — G308 Other Alzheimer's disease: Secondary | ICD-10-CM | POA: Diagnosis not present

## 2017-08-08 DIAGNOSIS — F064 Anxiety disorder due to known physiological condition: Secondary | ICD-10-CM | POA: Diagnosis not present

## 2017-08-08 DIAGNOSIS — N400 Enlarged prostate without lower urinary tract symptoms: Secondary | ICD-10-CM | POA: Diagnosis not present

## 2017-08-08 DIAGNOSIS — K5900 Constipation, unspecified: Secondary | ICD-10-CM | POA: Diagnosis not present

## 2017-08-08 DIAGNOSIS — E638 Other specified nutritional deficiencies: Secondary | ICD-10-CM | POA: Diagnosis not present

## 2017-08-08 DIAGNOSIS — F3289 Other specified depressive episodes: Secondary | ICD-10-CM | POA: Diagnosis not present

## 2017-08-08 DIAGNOSIS — H04129 Dry eye syndrome of unspecified lacrimal gland: Secondary | ICD-10-CM | POA: Diagnosis not present

## 2017-08-08 DIAGNOSIS — I10 Essential (primary) hypertension: Secondary | ICD-10-CM | POA: Diagnosis not present

## 2017-08-08 DIAGNOSIS — F5101 Primary insomnia: Secondary | ICD-10-CM | POA: Diagnosis not present

## 2017-08-08 DIAGNOSIS — M6281 Muscle weakness (generalized): Secondary | ICD-10-CM | POA: Diagnosis not present

## 2017-08-08 DIAGNOSIS — K219 Gastro-esophageal reflux disease without esophagitis: Secondary | ICD-10-CM | POA: Diagnosis not present

## 2017-08-08 DIAGNOSIS — G8929 Other chronic pain: Secondary | ICD-10-CM | POA: Diagnosis not present

## 2017-08-08 DIAGNOSIS — G308 Other Alzheimer's disease: Secondary | ICD-10-CM | POA: Diagnosis not present

## 2017-08-09 DIAGNOSIS — R7989 Other specified abnormal findings of blood chemistry: Secondary | ICD-10-CM | POA: Diagnosis not present

## 2017-08-09 DIAGNOSIS — D649 Anemia, unspecified: Secondary | ICD-10-CM | POA: Diagnosis not present

## 2017-08-09 DIAGNOSIS — G308 Other Alzheimer's disease: Secondary | ICD-10-CM | POA: Diagnosis not present

## 2017-08-09 DIAGNOSIS — R569 Unspecified convulsions: Secondary | ICD-10-CM | POA: Diagnosis not present

## 2017-08-10 DIAGNOSIS — R451 Restlessness and agitation: Secondary | ICD-10-CM | POA: Diagnosis not present

## 2017-08-10 DIAGNOSIS — Z9181 History of falling: Secondary | ICD-10-CM | POA: Diagnosis not present

## 2017-08-10 DIAGNOSIS — W19XXXD Unspecified fall, subsequent encounter: Secondary | ICD-10-CM | POA: Diagnosis not present

## 2017-08-16 DIAGNOSIS — R609 Edema, unspecified: Secondary | ICD-10-CM | POA: Diagnosis not present

## 2017-08-29 DIAGNOSIS — R569 Unspecified convulsions: Secondary | ICD-10-CM | POA: Diagnosis not present

## 2017-08-29 DIAGNOSIS — F064 Anxiety disorder due to known physiological condition: Secondary | ICD-10-CM | POA: Diagnosis not present

## 2017-09-06 DIAGNOSIS — I10 Essential (primary) hypertension: Secondary | ICD-10-CM | POA: Diagnosis not present

## 2017-09-06 DIAGNOSIS — N400 Enlarged prostate without lower urinary tract symptoms: Secondary | ICD-10-CM | POA: Diagnosis not present

## 2017-09-06 DIAGNOSIS — K219 Gastro-esophageal reflux disease without esophagitis: Secondary | ICD-10-CM | POA: Diagnosis not present

## 2017-09-06 DIAGNOSIS — G308 Other Alzheimer's disease: Secondary | ICD-10-CM | POA: Diagnosis not present

## 2017-09-06 DIAGNOSIS — K5900 Constipation, unspecified: Secondary | ICD-10-CM | POA: Diagnosis not present

## 2017-09-06 DIAGNOSIS — G8929 Other chronic pain: Secondary | ICD-10-CM | POA: Diagnosis not present

## 2017-09-06 DIAGNOSIS — E638 Other specified nutritional deficiencies: Secondary | ICD-10-CM | POA: Diagnosis not present

## 2017-09-06 DIAGNOSIS — R197 Diarrhea, unspecified: Secondary | ICD-10-CM | POA: Diagnosis not present

## 2017-09-06 DIAGNOSIS — M6281 Muscle weakness (generalized): Secondary | ICD-10-CM | POA: Diagnosis not present

## 2017-09-06 DIAGNOSIS — H04129 Dry eye syndrome of unspecified lacrimal gland: Secondary | ICD-10-CM | POA: Diagnosis not present

## 2017-09-06 DIAGNOSIS — F3289 Other specified depressive episodes: Secondary | ICD-10-CM | POA: Diagnosis not present

## 2017-09-06 DIAGNOSIS — F064 Anxiety disorder due to known physiological condition: Secondary | ICD-10-CM | POA: Diagnosis not present

## 2017-09-13 DIAGNOSIS — R296 Repeated falls: Secondary | ICD-10-CM | POA: Diagnosis not present

## 2017-09-21 DIAGNOSIS — R05 Cough: Secondary | ICD-10-CM | POA: Diagnosis not present

## 2017-09-21 DIAGNOSIS — R0989 Other specified symptoms and signs involving the circulatory and respiratory systems: Secondary | ICD-10-CM | POA: Diagnosis not present

## 2017-09-27 DIAGNOSIS — B001 Herpesviral vesicular dermatitis: Secondary | ICD-10-CM | POA: Diagnosis not present

## 2017-09-28 DIAGNOSIS — F0391 Unspecified dementia with behavioral disturbance: Secondary | ICD-10-CM | POA: Diagnosis not present

## 2017-09-28 DIAGNOSIS — R451 Restlessness and agitation: Secondary | ICD-10-CM | POA: Diagnosis not present

## 2017-09-30 DIAGNOSIS — F064 Anxiety disorder due to known physiological condition: Secondary | ICD-10-CM | POA: Diagnosis not present

## 2017-09-30 DIAGNOSIS — E119 Type 2 diabetes mellitus without complications: Secondary | ICD-10-CM | POA: Diagnosis not present

## 2017-09-30 DIAGNOSIS — G308 Other Alzheimer's disease: Secondary | ICD-10-CM | POA: Diagnosis not present

## 2017-09-30 DIAGNOSIS — R319 Hematuria, unspecified: Secondary | ICD-10-CM | POA: Diagnosis not present

## 2017-10-03 DIAGNOSIS — R451 Restlessness and agitation: Secondary | ICD-10-CM | POA: Diagnosis not present

## 2017-10-03 DIAGNOSIS — F0391 Unspecified dementia with behavioral disturbance: Secondary | ICD-10-CM | POA: Diagnosis not present

## 2017-10-05 DIAGNOSIS — N400 Enlarged prostate without lower urinary tract symptoms: Secondary | ICD-10-CM | POA: Diagnosis not present

## 2017-10-05 DIAGNOSIS — G308 Other Alzheimer's disease: Secondary | ICD-10-CM | POA: Diagnosis not present

## 2017-10-05 DIAGNOSIS — F3289 Other specified depressive episodes: Secondary | ICD-10-CM | POA: Diagnosis not present

## 2017-10-05 DIAGNOSIS — G8929 Other chronic pain: Secondary | ICD-10-CM | POA: Diagnosis not present

## 2017-10-05 DIAGNOSIS — K219 Gastro-esophageal reflux disease without esophagitis: Secondary | ICD-10-CM | POA: Diagnosis not present

## 2017-10-05 DIAGNOSIS — I10 Essential (primary) hypertension: Secondary | ICD-10-CM | POA: Diagnosis not present

## 2017-10-05 DIAGNOSIS — K5900 Constipation, unspecified: Secondary | ICD-10-CM | POA: Diagnosis not present

## 2017-10-05 DIAGNOSIS — F064 Anxiety disorder due to known physiological condition: Secondary | ICD-10-CM | POA: Diagnosis not present

## 2017-10-05 DIAGNOSIS — R197 Diarrhea, unspecified: Secondary | ICD-10-CM | POA: Diagnosis not present

## 2017-10-05 DIAGNOSIS — H04129 Dry eye syndrome of unspecified lacrimal gland: Secondary | ICD-10-CM | POA: Diagnosis not present

## 2017-10-05 DIAGNOSIS — E638 Other specified nutritional deficiencies: Secondary | ICD-10-CM | POA: Diagnosis not present

## 2017-10-05 DIAGNOSIS — M6281 Muscle weakness (generalized): Secondary | ICD-10-CM | POA: Diagnosis not present

## 2017-10-14 DIAGNOSIS — M25562 Pain in left knee: Secondary | ICD-10-CM | POA: Diagnosis not present

## 2017-10-14 DIAGNOSIS — R296 Repeated falls: Secondary | ICD-10-CM | POA: Diagnosis not present

## 2017-10-24 DIAGNOSIS — R296 Repeated falls: Secondary | ICD-10-CM | POA: Diagnosis not present

## 2017-10-25 DIAGNOSIS — G308 Other Alzheimer's disease: Secondary | ICD-10-CM | POA: Diagnosis not present

## 2017-10-25 DIAGNOSIS — F3289 Other specified depressive episodes: Secondary | ICD-10-CM | POA: Diagnosis not present

## 2017-10-27 DIAGNOSIS — K5900 Constipation, unspecified: Secondary | ICD-10-CM | POA: Diagnosis not present

## 2017-10-27 DIAGNOSIS — F064 Anxiety disorder due to known physiological condition: Secondary | ICD-10-CM | POA: Diagnosis not present

## 2017-10-27 DIAGNOSIS — N400 Enlarged prostate without lower urinary tract symptoms: Secondary | ICD-10-CM | POA: Diagnosis not present

## 2017-10-27 DIAGNOSIS — I10 Essential (primary) hypertension: Secondary | ICD-10-CM | POA: Diagnosis not present

## 2017-10-27 DIAGNOSIS — N189 Chronic kidney disease, unspecified: Secondary | ICD-10-CM | POA: Diagnosis not present

## 2017-10-27 DIAGNOSIS — F3289 Other specified depressive episodes: Secondary | ICD-10-CM | POA: Diagnosis not present

## 2017-10-27 DIAGNOSIS — K219 Gastro-esophageal reflux disease without esophagitis: Secondary | ICD-10-CM | POA: Diagnosis not present

## 2017-10-27 DIAGNOSIS — M6281 Muscle weakness (generalized): Secondary | ICD-10-CM | POA: Diagnosis not present

## 2017-10-27 DIAGNOSIS — H04129 Dry eye syndrome of unspecified lacrimal gland: Secondary | ICD-10-CM | POA: Diagnosis not present

## 2017-10-27 DIAGNOSIS — G308 Other Alzheimer's disease: Secondary | ICD-10-CM | POA: Diagnosis not present

## 2017-10-27 DIAGNOSIS — R296 Repeated falls: Secondary | ICD-10-CM | POA: Diagnosis not present

## 2017-10-27 DIAGNOSIS — G8929 Other chronic pain: Secondary | ICD-10-CM | POA: Diagnosis not present

## 2017-11-03 IMAGING — CR DG CHEST 2V
2 series · 2 of 2 positions shown · non-contrast
Comparison: 07/14/2015

CLINICAL DATA: Prolonged cough and congestion in chest, history
diabetes mellitus, hypertension, stroke, renal insufficiency,
peripheral vascular disease

EXAM:
CHEST  2 VIEW

[w chest lat]
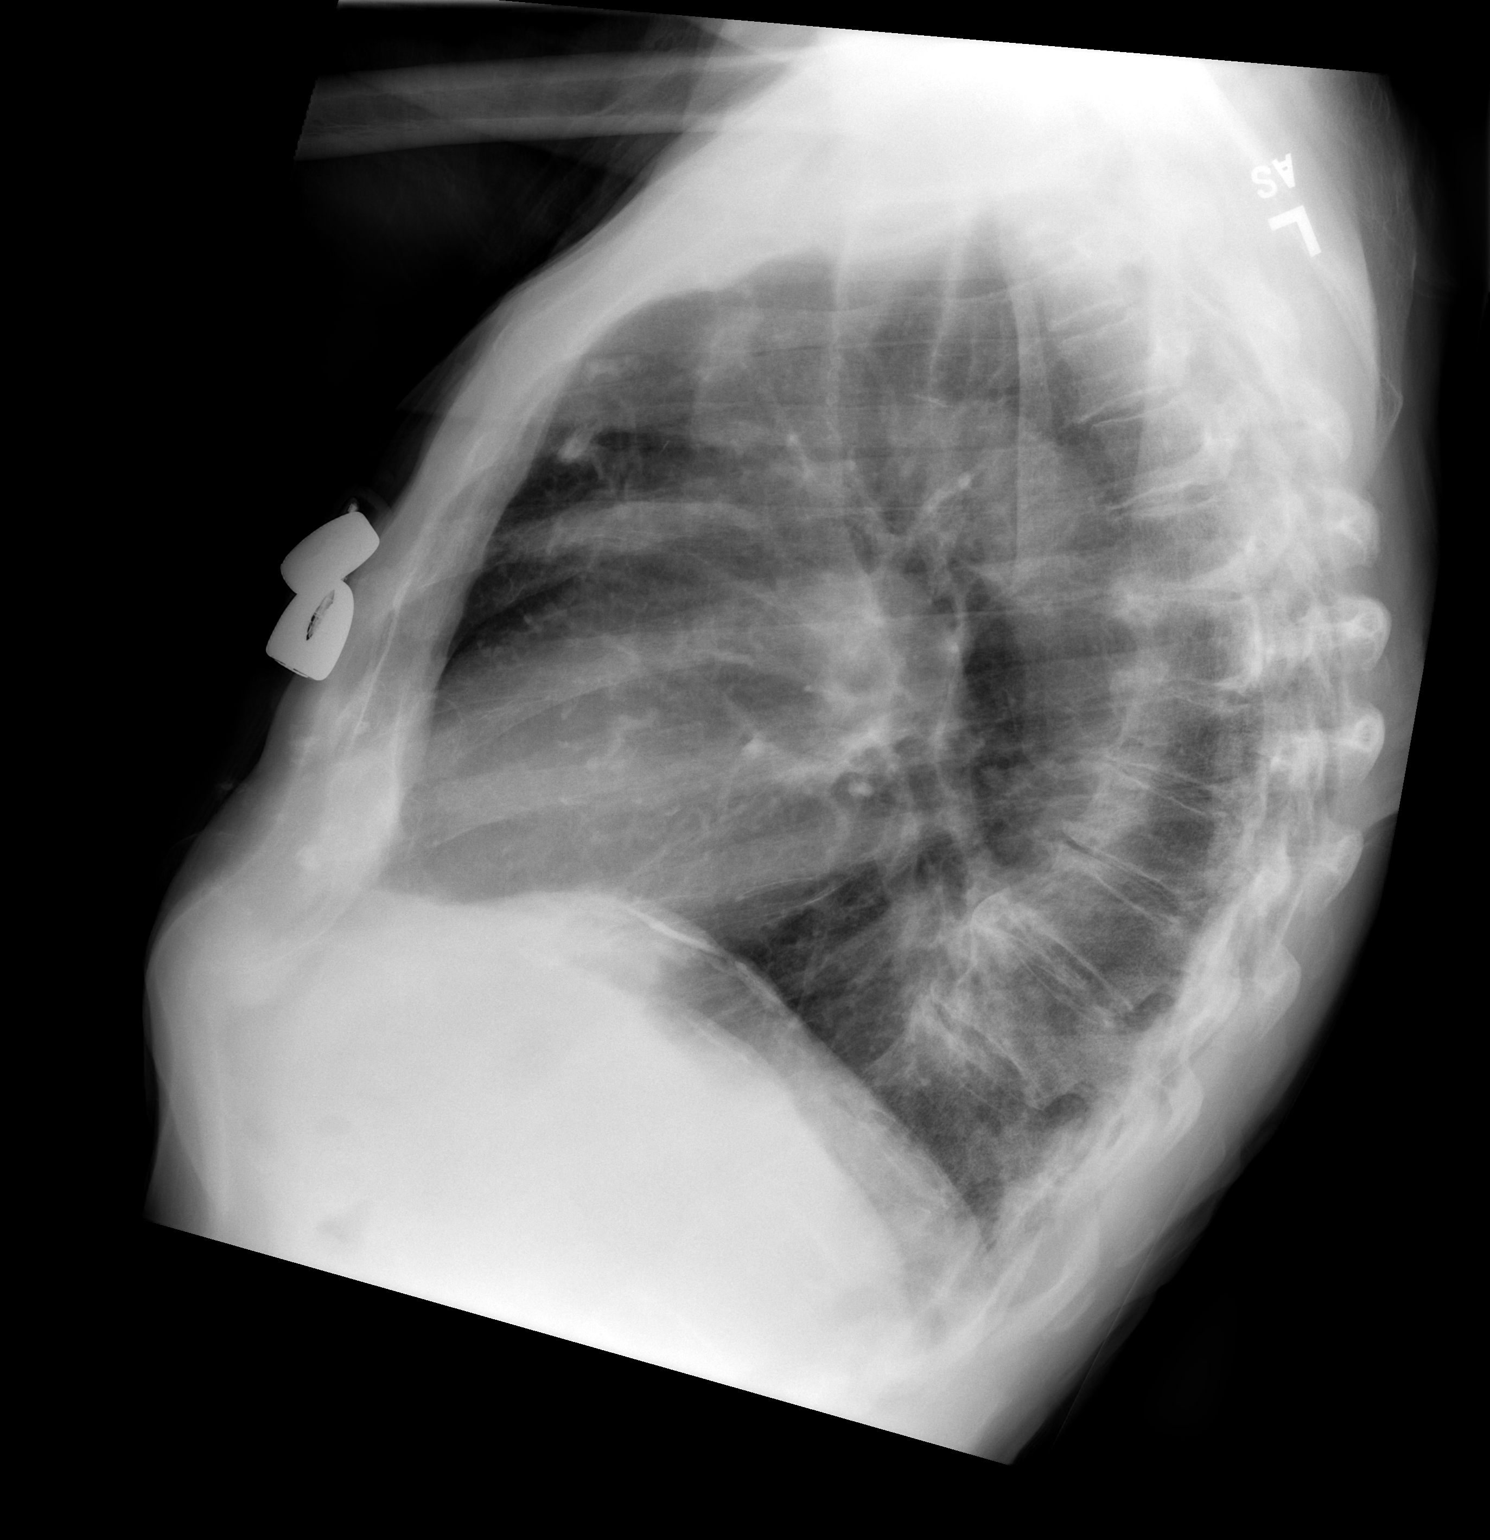

[w chest ap]
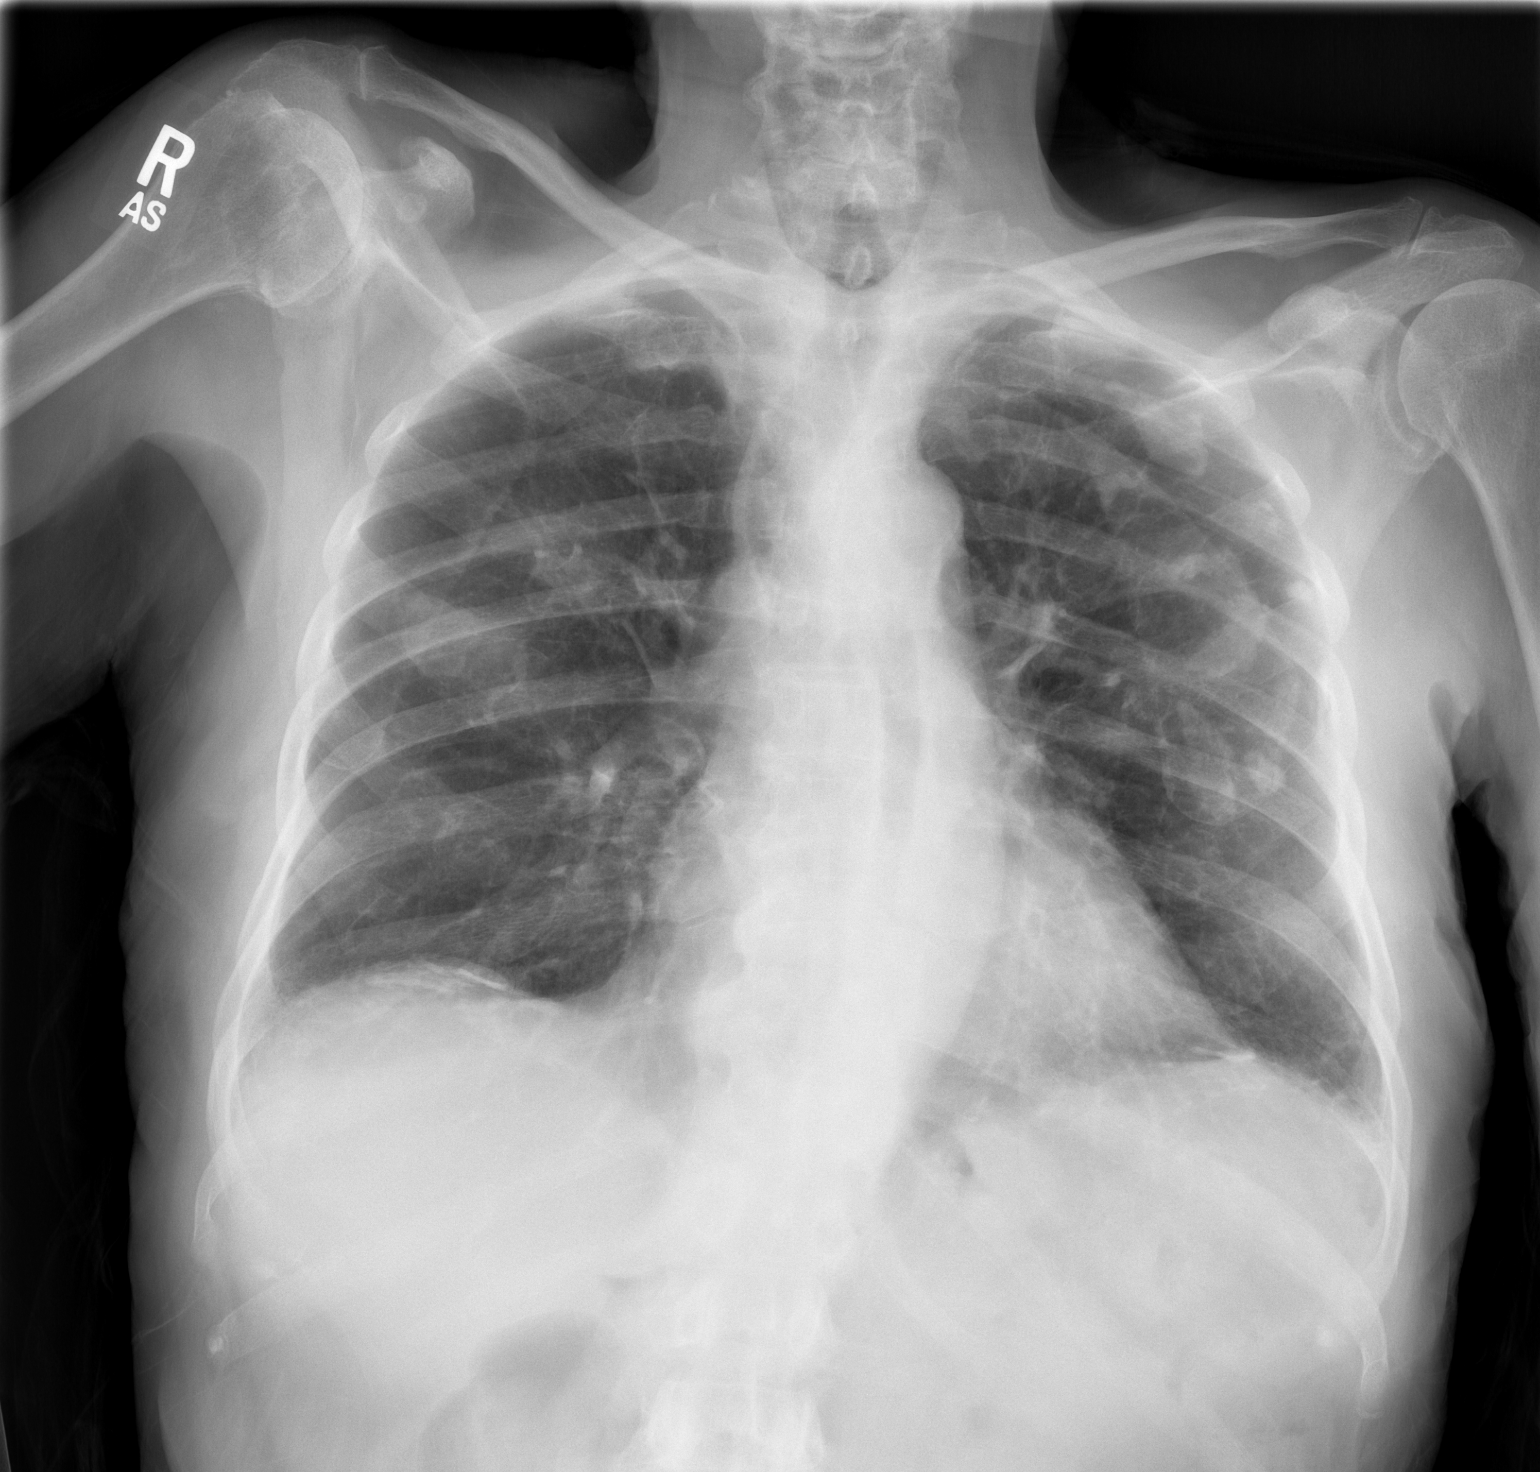

[2 of 2 positions shown; findings below may reference images not displayed]

FINDINGS: Normal heart size, mediastinal contours and pulmonary vascularity.

Calcified pleural plaque disease bilaterally consistent with
asbestos exposure.

Underlying emphysematous changes.

No acute infiltrate, pleural effusion or pneumothorax.

Mild atherosclerotic calcification thoracic aorta.

Bones demineralized.
IMPRESSION: Prior asbestos exposure and question COPD.

No acute abnormalities.

## 2017-11-04 DIAGNOSIS — F3289 Other specified depressive episodes: Secondary | ICD-10-CM | POA: Diagnosis not present

## 2017-11-04 DIAGNOSIS — F0391 Unspecified dementia with behavioral disturbance: Secondary | ICD-10-CM | POA: Diagnosis not present

## 2017-11-04 DIAGNOSIS — G308 Other Alzheimer's disease: Secondary | ICD-10-CM | POA: Diagnosis not present

## 2017-11-08 DIAGNOSIS — R197 Diarrhea, unspecified: Secondary | ICD-10-CM | POA: Diagnosis not present

## 2017-11-08 DIAGNOSIS — I10 Essential (primary) hypertension: Secondary | ICD-10-CM | POA: Diagnosis not present

## 2017-11-08 DIAGNOSIS — D649 Anemia, unspecified: Secondary | ICD-10-CM | POA: Diagnosis not present

## 2017-11-08 DIAGNOSIS — E638 Other specified nutritional deficiencies: Secondary | ICD-10-CM | POA: Diagnosis not present

## 2017-11-08 DIAGNOSIS — H04129 Dry eye syndrome of unspecified lacrimal gland: Secondary | ICD-10-CM | POA: Diagnosis not present

## 2017-11-08 DIAGNOSIS — R569 Unspecified convulsions: Secondary | ICD-10-CM | POA: Diagnosis not present

## 2017-11-08 DIAGNOSIS — K219 Gastro-esophageal reflux disease without esophagitis: Secondary | ICD-10-CM | POA: Diagnosis not present

## 2017-11-08 DIAGNOSIS — M6281 Muscle weakness (generalized): Secondary | ICD-10-CM | POA: Diagnosis not present

## 2017-11-08 DIAGNOSIS — G8929 Other chronic pain: Secondary | ICD-10-CM | POA: Diagnosis not present

## 2017-11-08 DIAGNOSIS — R296 Repeated falls: Secondary | ICD-10-CM | POA: Diagnosis not present

## 2017-11-08 DIAGNOSIS — F064 Anxiety disorder due to known physiological condition: Secondary | ICD-10-CM | POA: Diagnosis not present

## 2017-11-08 DIAGNOSIS — K5900 Constipation, unspecified: Secondary | ICD-10-CM | POA: Diagnosis not present

## 2017-11-08 DIAGNOSIS — N189 Chronic kidney disease, unspecified: Secondary | ICD-10-CM | POA: Diagnosis not present

## 2017-11-08 DIAGNOSIS — N400 Enlarged prostate without lower urinary tract symptoms: Secondary | ICD-10-CM | POA: Diagnosis not present

## 2017-11-28 DIAGNOSIS — G308 Other Alzheimer's disease: Secondary | ICD-10-CM | POA: Diagnosis not present

## 2017-11-28 DIAGNOSIS — R451 Restlessness and agitation: Secondary | ICD-10-CM | POA: Diagnosis not present

## 2017-12-01 DIAGNOSIS — R296 Repeated falls: Secondary | ICD-10-CM | POA: Diagnosis not present

## 2017-12-08 DIAGNOSIS — R296 Repeated falls: Secondary | ICD-10-CM | POA: Diagnosis not present

## 2017-12-14 DIAGNOSIS — E638 Other specified nutritional deficiencies: Secondary | ICD-10-CM | POA: Diagnosis not present

## 2017-12-14 DIAGNOSIS — R296 Repeated falls: Secondary | ICD-10-CM | POA: Diagnosis not present

## 2017-12-14 DIAGNOSIS — D649 Anemia, unspecified: Secondary | ICD-10-CM | POA: Diagnosis not present

## 2017-12-14 DIAGNOSIS — K219 Gastro-esophageal reflux disease without esophagitis: Secondary | ICD-10-CM | POA: Diagnosis not present

## 2017-12-14 DIAGNOSIS — N189 Chronic kidney disease, unspecified: Secondary | ICD-10-CM | POA: Diagnosis not present

## 2017-12-14 DIAGNOSIS — I10 Essential (primary) hypertension: Secondary | ICD-10-CM | POA: Diagnosis not present

## 2017-12-14 DIAGNOSIS — N400 Enlarged prostate without lower urinary tract symptoms: Secondary | ICD-10-CM | POA: Diagnosis not present

## 2017-12-14 DIAGNOSIS — R197 Diarrhea, unspecified: Secondary | ICD-10-CM | POA: Diagnosis not present

## 2017-12-14 DIAGNOSIS — M6281 Muscle weakness (generalized): Secondary | ICD-10-CM | POA: Diagnosis not present

## 2017-12-14 DIAGNOSIS — G8929 Other chronic pain: Secondary | ICD-10-CM | POA: Diagnosis not present

## 2017-12-14 DIAGNOSIS — H04129 Dry eye syndrome of unspecified lacrimal gland: Secondary | ICD-10-CM | POA: Diagnosis not present

## 2017-12-14 DIAGNOSIS — K5900 Constipation, unspecified: Secondary | ICD-10-CM | POA: Diagnosis not present

## 2017-12-23 DIAGNOSIS — B379 Candidiasis, unspecified: Secondary | ICD-10-CM | POA: Diagnosis not present

## 2017-12-27 DIAGNOSIS — R296 Repeated falls: Secondary | ICD-10-CM | POA: Diagnosis not present

## 2017-12-28 DIAGNOSIS — N400 Enlarged prostate without lower urinary tract symptoms: Secondary | ICD-10-CM | POA: Diagnosis not present

## 2018-01-04 DIAGNOSIS — N189 Chronic kidney disease, unspecified: Secondary | ICD-10-CM | POA: Diagnosis not present

## 2018-01-04 DIAGNOSIS — N400 Enlarged prostate without lower urinary tract symptoms: Secondary | ICD-10-CM | POA: Diagnosis not present

## 2018-01-04 DIAGNOSIS — H04129 Dry eye syndrome of unspecified lacrimal gland: Secondary | ICD-10-CM | POA: Diagnosis not present

## 2018-01-04 DIAGNOSIS — E638 Other specified nutritional deficiencies: Secondary | ICD-10-CM | POA: Diagnosis not present

## 2018-01-04 DIAGNOSIS — R296 Repeated falls: Secondary | ICD-10-CM | POA: Diagnosis not present

## 2018-01-04 DIAGNOSIS — G8929 Other chronic pain: Secondary | ICD-10-CM | POA: Diagnosis not present

## 2018-01-04 DIAGNOSIS — K219 Gastro-esophageal reflux disease without esophagitis: Secondary | ICD-10-CM | POA: Diagnosis not present

## 2018-01-04 DIAGNOSIS — R197 Diarrhea, unspecified: Secondary | ICD-10-CM | POA: Diagnosis not present

## 2018-01-04 DIAGNOSIS — M6281 Muscle weakness (generalized): Secondary | ICD-10-CM | POA: Diagnosis not present

## 2018-01-04 DIAGNOSIS — D649 Anemia, unspecified: Secondary | ICD-10-CM | POA: Diagnosis not present

## 2018-01-04 DIAGNOSIS — I10 Essential (primary) hypertension: Secondary | ICD-10-CM | POA: Diagnosis not present

## 2018-01-04 DIAGNOSIS — K5900 Constipation, unspecified: Secondary | ICD-10-CM | POA: Diagnosis not present

## 2018-01-12 DIAGNOSIS — D649 Anemia, unspecified: Secondary | ICD-10-CM | POA: Diagnosis not present

## 2018-01-12 DIAGNOSIS — N189 Chronic kidney disease, unspecified: Secondary | ICD-10-CM | POA: Diagnosis not present

## 2018-01-12 DIAGNOSIS — R296 Repeated falls: Secondary | ICD-10-CM | POA: Diagnosis not present

## 2018-01-12 DIAGNOSIS — R197 Diarrhea, unspecified: Secondary | ICD-10-CM | POA: Diagnosis not present

## 2018-01-12 DIAGNOSIS — N400 Enlarged prostate without lower urinary tract symptoms: Secondary | ICD-10-CM | POA: Diagnosis not present

## 2018-01-12 DIAGNOSIS — K219 Gastro-esophageal reflux disease without esophagitis: Secondary | ICD-10-CM | POA: Diagnosis not present

## 2018-01-12 DIAGNOSIS — H04129 Dry eye syndrome of unspecified lacrimal gland: Secondary | ICD-10-CM | POA: Diagnosis not present

## 2018-01-12 DIAGNOSIS — M6281 Muscle weakness (generalized): Secondary | ICD-10-CM | POA: Diagnosis not present

## 2018-01-12 DIAGNOSIS — G8929 Other chronic pain: Secondary | ICD-10-CM | POA: Diagnosis not present

## 2018-01-12 DIAGNOSIS — E638 Other specified nutritional deficiencies: Secondary | ICD-10-CM | POA: Diagnosis not present

## 2018-01-12 DIAGNOSIS — I10 Essential (primary) hypertension: Secondary | ICD-10-CM | POA: Diagnosis not present

## 2018-01-12 DIAGNOSIS — K5900 Constipation, unspecified: Secondary | ICD-10-CM | POA: Diagnosis not present

## 2018-01-26 DIAGNOSIS — R112 Nausea with vomiting, unspecified: Secondary | ICD-10-CM | POA: Diagnosis not present

## 2018-01-26 DIAGNOSIS — R0600 Dyspnea, unspecified: Secondary | ICD-10-CM | POA: Diagnosis not present

## 2018-01-26 DIAGNOSIS — R0902 Hypoxemia: Secondary | ICD-10-CM | POA: Diagnosis not present

## 2018-01-26 DIAGNOSIS — K219 Gastro-esophageal reflux disease without esophagitis: Secondary | ICD-10-CM | POA: Diagnosis not present

## 2018-02-07 DIAGNOSIS — B3749 Other urogenital candidiasis: Secondary | ICD-10-CM | POA: Diagnosis not present

## 2018-02-14 DIAGNOSIS — R635 Abnormal weight gain: Secondary | ICD-10-CM | POA: Diagnosis not present

## 2018-02-14 DIAGNOSIS — H04129 Dry eye syndrome of unspecified lacrimal gland: Secondary | ICD-10-CM | POA: Diagnosis not present

## 2018-02-14 DIAGNOSIS — D649 Anemia, unspecified: Secondary | ICD-10-CM | POA: Diagnosis not present

## 2018-02-14 DIAGNOSIS — G8929 Other chronic pain: Secondary | ICD-10-CM | POA: Diagnosis not present

## 2018-02-14 DIAGNOSIS — N400 Enlarged prostate without lower urinary tract symptoms: Secondary | ICD-10-CM | POA: Diagnosis not present

## 2018-02-14 DIAGNOSIS — N189 Chronic kidney disease, unspecified: Secondary | ICD-10-CM | POA: Diagnosis not present

## 2018-02-14 DIAGNOSIS — I10 Essential (primary) hypertension: Secondary | ICD-10-CM | POA: Diagnosis not present

## 2018-02-14 DIAGNOSIS — R296 Repeated falls: Secondary | ICD-10-CM | POA: Diagnosis not present

## 2018-02-14 DIAGNOSIS — K219 Gastro-esophageal reflux disease without esophagitis: Secondary | ICD-10-CM | POA: Diagnosis not present

## 2018-02-14 DIAGNOSIS — G308 Other Alzheimer's disease: Secondary | ICD-10-CM | POA: Diagnosis not present

## 2018-02-14 DIAGNOSIS — M6281 Muscle weakness (generalized): Secondary | ICD-10-CM | POA: Diagnosis not present

## 2018-02-14 DIAGNOSIS — K5900 Constipation, unspecified: Secondary | ICD-10-CM | POA: Diagnosis not present

## 2018-02-20 DIAGNOSIS — F064 Anxiety disorder due to known physiological condition: Secondary | ICD-10-CM | POA: Diagnosis not present

## 2018-02-20 DIAGNOSIS — R569 Unspecified convulsions: Secondary | ICD-10-CM | POA: Diagnosis not present

## 2018-02-20 DIAGNOSIS — D649 Anemia, unspecified: Secondary | ICD-10-CM | POA: Diagnosis not present

## 2018-02-20 DIAGNOSIS — I10 Essential (primary) hypertension: Secondary | ICD-10-CM | POA: Diagnosis not present

## 2018-02-20 DIAGNOSIS — R4182 Altered mental status, unspecified: Secondary | ICD-10-CM | POA: Diagnosis not present

## 2018-02-21 DIAGNOSIS — R509 Fever, unspecified: Secondary | ICD-10-CM | POA: Diagnosis not present

## 2018-02-21 DIAGNOSIS — D72829 Elevated white blood cell count, unspecified: Secondary | ICD-10-CM | POA: Diagnosis not present

## 2018-02-21 DIAGNOSIS — R05 Cough: Secondary | ICD-10-CM | POA: Diagnosis not present

## 2018-02-21 DIAGNOSIS — R110 Nausea: Secondary | ICD-10-CM | POA: Diagnosis not present

## 2018-02-22 DIAGNOSIS — D72829 Elevated white blood cell count, unspecified: Secondary | ICD-10-CM | POA: Diagnosis not present

## 2018-02-22 DIAGNOSIS — R509 Fever, unspecified: Secondary | ICD-10-CM | POA: Diagnosis not present

## 2018-02-23 DIAGNOSIS — R509 Fever, unspecified: Secondary | ICD-10-CM | POA: Diagnosis not present

## 2018-02-23 DIAGNOSIS — D649 Anemia, unspecified: Secondary | ICD-10-CM | POA: Diagnosis not present

## 2018-03-02 DIAGNOSIS — Z79899 Other long term (current) drug therapy: Secondary | ICD-10-CM | POA: Diagnosis not present

## 2018-03-02 DIAGNOSIS — D649 Anemia, unspecified: Secondary | ICD-10-CM | POA: Diagnosis not present

## 2018-03-13 ENCOUNTER — Encounter (HOSPITAL_COMMUNITY): Payer: Self-pay

## 2018-03-13 ENCOUNTER — Inpatient Hospital Stay (HOSPITAL_COMMUNITY)
Admission: EM | Admit: 2018-03-13 | Discharge: 2018-03-15 | DRG: 871 | Disposition: A | Discharge status: Skilled Nursing Facility | Payer: Medicare Other | Source: Skilled Nursing Facility | Attending: Family Medicine | Admitting: Family Medicine

## 2018-03-13 ENCOUNTER — Other Ambulatory Visit: Payer: Self-pay

## 2018-03-13 ENCOUNTER — Emergency Department (HOSPITAL_COMMUNITY): Payer: Medicare Other

## 2018-03-13 ENCOUNTER — Inpatient Hospital Stay (HOSPITAL_COMMUNITY): Payer: Medicare Other

## 2018-03-13 DIAGNOSIS — G319 Degenerative disease of nervous system, unspecified: Secondary | ICD-10-CM | POA: Diagnosis present

## 2018-03-13 DIAGNOSIS — Z66 Do not resuscitate: Secondary | ICD-10-CM | POA: Diagnosis present

## 2018-03-13 DIAGNOSIS — J9601 Acute respiratory failure with hypoxia: Secondary | ICD-10-CM | POA: Diagnosis present

## 2018-03-13 DIAGNOSIS — E875 Hyperkalemia: Secondary | ICD-10-CM | POA: Diagnosis present

## 2018-03-13 DIAGNOSIS — E1151 Type 2 diabetes mellitus with diabetic peripheral angiopathy without gangrene: Secondary | ICD-10-CM | POA: Diagnosis present

## 2018-03-13 DIAGNOSIS — R51 Headache: Secondary | ICD-10-CM | POA: Diagnosis not present

## 2018-03-13 DIAGNOSIS — R509 Fever, unspecified: Secondary | ICD-10-CM | POA: Insufficient documentation

## 2018-03-13 DIAGNOSIS — I129 Hypertensive chronic kidney disease with stage 1 through stage 4 chronic kidney disease, or unspecified chronic kidney disease: Secondary | ICD-10-CM | POA: Diagnosis present

## 2018-03-13 DIAGNOSIS — R404 Transient alteration of awareness: Secondary | ICD-10-CM | POA: Diagnosis not present

## 2018-03-13 DIAGNOSIS — E1142 Type 2 diabetes mellitus with diabetic polyneuropathy: Secondary | ICD-10-CM | POA: Diagnosis present

## 2018-03-13 DIAGNOSIS — F0280 Dementia in other diseases classified elsewhere without behavioral disturbance: Secondary | ICD-10-CM | POA: Diagnosis present

## 2018-03-13 DIAGNOSIS — S0081XA Abrasion of other part of head, initial encounter: Secondary | ICD-10-CM | POA: Diagnosis not present

## 2018-03-13 DIAGNOSIS — G309 Alzheimer's disease, unspecified: Secondary | ICD-10-CM | POA: Diagnosis present

## 2018-03-13 DIAGNOSIS — Z515 Encounter for palliative care: Secondary | ICD-10-CM

## 2018-03-13 DIAGNOSIS — R05 Cough: Secondary | ICD-10-CM | POA: Diagnosis not present

## 2018-03-13 DIAGNOSIS — J90 Pleural effusion, not elsewhere classified: Secondary | ICD-10-CM | POA: Diagnosis not present

## 2018-03-13 DIAGNOSIS — Z833 Family history of diabetes mellitus: Secondary | ICD-10-CM

## 2018-03-13 DIAGNOSIS — R4020 Unspecified coma: Secondary | ICD-10-CM | POA: Diagnosis not present

## 2018-03-13 DIAGNOSIS — I371 Nonrheumatic pulmonary valve insufficiency: Secondary | ICD-10-CM | POA: Diagnosis not present

## 2018-03-13 DIAGNOSIS — R4182 Altered mental status, unspecified: Secondary | ICD-10-CM | POA: Diagnosis not present

## 2018-03-13 DIAGNOSIS — Z6820 Body mass index (BMI) 20.0-20.9, adult: Secondary | ICD-10-CM

## 2018-03-13 DIAGNOSIS — A419 Sepsis, unspecified organism: Secondary | ICD-10-CM | POA: Diagnosis not present

## 2018-03-13 DIAGNOSIS — R55 Syncope and collapse: Secondary | ICD-10-CM | POA: Diagnosis not present

## 2018-03-13 DIAGNOSIS — G9341 Metabolic encephalopathy: Secondary | ICD-10-CM | POA: Diagnosis present

## 2018-03-13 DIAGNOSIS — R410 Disorientation, unspecified: Secondary | ICD-10-CM | POA: Diagnosis not present

## 2018-03-13 DIAGNOSIS — W1830XA Fall on same level, unspecified, initial encounter: Secondary | ICD-10-CM | POA: Diagnosis not present

## 2018-03-13 DIAGNOSIS — E1122 Type 2 diabetes mellitus with diabetic chronic kidney disease: Secondary | ICD-10-CM | POA: Diagnosis present

## 2018-03-13 DIAGNOSIS — M2550 Pain in unspecified joint: Secondary | ICD-10-CM | POA: Diagnosis not present

## 2018-03-13 DIAGNOSIS — Z7189 Other specified counseling: Secondary | ICD-10-CM

## 2018-03-13 DIAGNOSIS — N183 Chronic kidney disease, stage 3 (moderate): Secondary | ICD-10-CM | POA: Diagnosis present

## 2018-03-13 DIAGNOSIS — E43 Unspecified severe protein-calorie malnutrition: Secondary | ICD-10-CM | POA: Diagnosis present

## 2018-03-13 DIAGNOSIS — M109 Gout, unspecified: Secondary | ICD-10-CM | POA: Diagnosis present

## 2018-03-13 DIAGNOSIS — Z888 Allergy status to other drugs, medicaments and biological substances status: Secondary | ICD-10-CM

## 2018-03-13 DIAGNOSIS — F39 Unspecified mood [affective] disorder: Secondary | ICD-10-CM | POA: Diagnosis present

## 2018-03-13 DIAGNOSIS — E119 Type 2 diabetes mellitus without complications: Secondary | ICD-10-CM

## 2018-03-13 DIAGNOSIS — I350 Nonrheumatic aortic (valve) stenosis: Secondary | ICD-10-CM | POA: Diagnosis present

## 2018-03-13 DIAGNOSIS — Z7982 Long term (current) use of aspirin: Secondary | ICD-10-CM

## 2018-03-13 DIAGNOSIS — R1310 Dysphagia, unspecified: Secondary | ICD-10-CM | POA: Diagnosis present

## 2018-03-13 DIAGNOSIS — Z77090 Contact with and (suspected) exposure to asbestos: Secondary | ICD-10-CM | POA: Diagnosis present

## 2018-03-13 DIAGNOSIS — K219 Gastro-esophageal reflux disease without esophagitis: Secondary | ICD-10-CM | POA: Diagnosis present

## 2018-03-13 DIAGNOSIS — N400 Enlarged prostate without lower urinary tract symptoms: Secondary | ICD-10-CM | POA: Diagnosis present

## 2018-03-13 DIAGNOSIS — W19XXXA Unspecified fall, initial encounter: Secondary | ICD-10-CM | POA: Diagnosis not present

## 2018-03-13 DIAGNOSIS — Z7401 Bed confinement status: Secondary | ICD-10-CM | POA: Diagnosis not present

## 2018-03-13 DIAGNOSIS — E785 Hyperlipidemia, unspecified: Secondary | ICD-10-CM | POA: Diagnosis present

## 2018-03-13 DIAGNOSIS — S0990XA Unspecified injury of head, initial encounter: Secondary | ICD-10-CM | POA: Diagnosis not present

## 2018-03-13 DIAGNOSIS — J189 Pneumonia, unspecified organism: Secondary | ICD-10-CM | POA: Diagnosis not present

## 2018-03-13 DIAGNOSIS — S199XXA Unspecified injury of neck, initial encounter: Secondary | ICD-10-CM | POA: Diagnosis not present

## 2018-03-13 DIAGNOSIS — I248 Other forms of acute ischemic heart disease: Secondary | ICD-10-CM | POA: Diagnosis present

## 2018-03-13 DIAGNOSIS — N289 Disorder of kidney and ureter, unspecified: Secondary | ICD-10-CM

## 2018-03-13 DIAGNOSIS — R451 Restlessness and agitation: Secondary | ICD-10-CM | POA: Diagnosis not present

## 2018-03-13 DIAGNOSIS — Z7984 Long term (current) use of oral hypoglycemic drugs: Secondary | ICD-10-CM

## 2018-03-13 DIAGNOSIS — R296 Repeated falls: Secondary | ICD-10-CM | POA: Diagnosis present

## 2018-03-13 DIAGNOSIS — R456 Violent behavior: Secondary | ICD-10-CM | POA: Diagnosis not present

## 2018-03-13 DIAGNOSIS — Z79899 Other long term (current) drug therapy: Secondary | ICD-10-CM

## 2018-03-13 DIAGNOSIS — Z8249 Family history of ischemic heart disease and other diseases of the circulatory system: Secondary | ICD-10-CM

## 2018-03-13 DIAGNOSIS — R0902 Hypoxemia: Secondary | ICD-10-CM | POA: Diagnosis not present

## 2018-03-13 DIAGNOSIS — I361 Nonrheumatic tricuspid (valve) insufficiency: Secondary | ICD-10-CM | POA: Diagnosis not present

## 2018-03-13 DIAGNOSIS — Z8673 Personal history of transient ischemic attack (TIA), and cerebral infarction without residual deficits: Secondary | ICD-10-CM

## 2018-03-13 DIAGNOSIS — F419 Anxiety disorder, unspecified: Secondary | ICD-10-CM | POA: Diagnosis present

## 2018-03-13 DIAGNOSIS — I4510 Unspecified right bundle-branch block: Secondary | ICD-10-CM | POA: Diagnosis not present

## 2018-03-13 DIAGNOSIS — R599 Enlarged lymph nodes, unspecified: Secondary | ICD-10-CM | POA: Diagnosis present

## 2018-03-13 LAB — URINALYSIS, ROUTINE W REFLEX MICROSCOPIC
BACTERIA UA: NONE SEEN
BILIRUBIN URINE: NEGATIVE
GLUCOSE, UA: NEGATIVE mg/dL
KETONES UR: 5 mg/dL — AB
LEUKOCYTES UA: NEGATIVE
NITRITE: NEGATIVE
PROTEIN: 30 mg/dL — AB
Specific Gravity, Urine: 1.02 (ref 1.005–1.030)
pH: 5 (ref 5.0–8.0)

## 2018-03-13 LAB — CBC WITH DIFFERENTIAL/PLATELET
BASOS ABS: 0 10*3/uL (ref 0.0–0.1)
BASOS PCT: 0 %
EOS ABS: 0 10*3/uL (ref 0.0–0.7)
Eosinophils Relative: 0 %
HEMATOCRIT: 31.8 % — AB (ref 39.0–52.0)
HEMOGLOBIN: 10.3 g/dL — AB (ref 13.0–17.0)
Lymphocytes Relative: 5 %
Lymphs Abs: 0.9 10*3/uL (ref 0.7–4.0)
MCH: 31.3 pg (ref 26.0–34.0)
MCHC: 32.4 g/dL (ref 30.0–36.0)
MCV: 96.7 fL (ref 78.0–100.0)
MONOS PCT: 10 %
Monocytes Absolute: 1.8 10*3/uL — ABNORMAL HIGH (ref 0.1–1.0)
NEUTROS ABS: 14.8 10*3/uL — AB (ref 1.7–7.7)
NEUTROS PCT: 85 %
Platelets: 183 10*3/uL (ref 150–400)
RBC: 3.29 MIL/uL — AB (ref 4.22–5.81)
RDW: 14 % (ref 11.5–15.5)
WBC: 17.6 10*3/uL — AB (ref 4.0–10.5)

## 2018-03-13 LAB — COMPREHENSIVE METABOLIC PANEL
ALT: 8 U/L (ref 0–44)
AST: 31 U/L (ref 15–41)
Albumin: 2.8 g/dL — ABNORMAL LOW (ref 3.5–5.0)
Alkaline Phosphatase: 39 U/L (ref 38–126)
Anion gap: 14 (ref 5–15)
BUN: 29 mg/dL — ABNORMAL HIGH (ref 8–23)
CHLORIDE: 104 mmol/L (ref 98–111)
CO2: 22 mmol/L (ref 22–32)
CREATININE: 1.63 mg/dL — AB (ref 0.61–1.24)
Calcium: 8.6 mg/dL — ABNORMAL LOW (ref 8.9–10.3)
GFR calc non Af Amer: 33 mL/min — ABNORMAL LOW (ref >60)
GFR, EST AFRICAN AMERICAN: 38 mL/min — AB (ref >60)
Glucose, Bld: 162 mg/dL — ABNORMAL HIGH (ref 70–99)
POTASSIUM: 5.3 mmol/L — AB (ref 3.5–5.1)
SODIUM: 140 mmol/L (ref 135–145)
Total Bilirubin: 1.4 mg/dL — ABNORMAL HIGH (ref 0.3–1.2)
Total Protein: 5.8 g/dL — ABNORMAL LOW (ref 6.5–8.1)

## 2018-03-13 LAB — SEDIMENTATION RATE: Sed Rate: 18 mm/hr — ABNORMAL HIGH (ref 0–16)

## 2018-03-13 LAB — VALPROIC ACID LEVEL: Valproic Acid Lvl: 35 ug/mL — ABNORMAL LOW (ref 50.0–100.0)

## 2018-03-13 LAB — TROPONIN I
Troponin I: 0.03 ng/mL (ref <0.03)
Troponin I: 0.03 ng/mL (ref <0.03)

## 2018-03-13 LAB — CBG MONITORING, ED: GLUCOSE-CAPILLARY: 81 mg/dL (ref 70–99)

## 2018-03-13 LAB — VITAMIN B12: Vitamin B-12: 700 pg/mL (ref 180–914)

## 2018-03-13 LAB — TSH: TSH: 1.546 u[IU]/mL (ref 0.350–4.500)

## 2018-03-13 LAB — I-STAT CG4 LACTIC ACID, ED: LACTIC ACID, VENOUS: 3.17 mmol/L — AB (ref 0.5–1.9)

## 2018-03-13 LAB — BRAIN NATRIURETIC PEPTIDE: B NATRIURETIC PEPTIDE 5: 374 pg/mL — AB (ref 0.0–100.0)

## 2018-03-13 MED ORDER — SODIUM CHLORIDE 0.9 % IV BOLUS (SEPSIS)
1000.0000 mL | Freq: Once | INTRAVENOUS | Status: AC
  Administered 2018-03-13: 1000 mL via INTRAVENOUS

## 2018-03-13 MED ORDER — SODIUM CHLORIDE 0.9% FLUSH: 3.0000 mL | INTRAVENOUS | Status: DC | PRN

## 2018-03-13 MED ORDER — ACETAMINOPHEN 650 MG RE SUPP: 650.0000 mg | Freq: Four times a day (QID) | RECTAL | Status: DC | PRN

## 2018-03-13 MED ORDER — LORAZEPAM 0.5 MG PO TABS
0.5000 mg | ORAL_TABLET | Freq: Three times a day (TID) | ORAL | Status: DC
  Administered 2018-03-14 – 2018-03-15 (×4): 0.5 mg via ORAL
  Filled 2018-03-13 (×4): qty 1

## 2018-03-13 MED ORDER — SODIUM CHLORIDE 0.9 % IV SOLN
250.0000 mL | INTRAVENOUS | Status: DC | PRN
  Administered 2018-03-14: 250 mL via INTRAVENOUS

## 2018-03-13 MED ORDER — ENOXAPARIN SODIUM 30 MG/0.3ML ~~LOC~~ SOLN
30.0000 mg | Freq: Every day | SUBCUTANEOUS | Status: DC
  Administered 2018-03-13 – 2018-03-14 (×2): 30 mg via SUBCUTANEOUS
  Filled 2018-03-13 (×2): qty 0.3

## 2018-03-13 MED ORDER — POLYETHYLENE GLYCOL 3350 17 GM/SCOOP PO POWD: 17.0000 g | ORAL | Status: DC

## 2018-03-13 MED ORDER — FAMOTIDINE 20 MG PO TABS
10.0000 mg | ORAL_TABLET | Freq: Every evening | ORAL | Status: DC
  Administered 2018-03-14: 10 mg via ORAL
  Filled 2018-03-13: qty 1

## 2018-03-13 MED ORDER — SODIUM CHLORIDE 0.9 % IV SOLN
2.0000 g | INTRAVENOUS | Status: DC
  Administered 2018-03-13 – 2018-03-14 (×2): 2 g via INTRAVENOUS
  Filled 2018-03-13: qty 2
  Filled 2018-03-13 (×2): qty 20

## 2018-03-13 MED ORDER — ACETAMINOPHEN 325 MG PO TABS: 650.0000 mg | ORAL_TABLET | Freq: Four times a day (QID) | ORAL | Status: DC | PRN

## 2018-03-13 MED ORDER — ACETAMINOPHEN 650 MG RE SUPP
650.0000 mg | Freq: Once | RECTAL | Status: AC
  Administered 2018-03-13: 650 mg via RECTAL
  Filled 2018-03-13: qty 1

## 2018-03-13 MED ORDER — CITALOPRAM HYDROBROMIDE 10 MG PO TABS
10.0000 mg | ORAL_TABLET | Freq: Every day | ORAL | Status: DC
  Administered 2018-03-15: 10 mg via ORAL
  Filled 2018-03-13 (×2): qty 1

## 2018-03-13 MED ORDER — POLYVINYL ALCOHOL 1.4 % OP SOLN
2.0000 [drp] | Freq: Three times a day (TID) | OPHTHALMIC | Status: DC
  Administered 2018-03-13 – 2018-03-15 (×6): 2 [drp] via OPHTHALMIC
  Filled 2018-03-13: qty 15

## 2018-03-13 MED ORDER — TRAZODONE HCL 50 MG PO TABS
25.0000 mg | ORAL_TABLET | Freq: Every day | ORAL | Status: DC
  Administered 2018-03-14: 25 mg via ORAL
  Filled 2018-03-13: qty 1

## 2018-03-13 MED ORDER — POLYETHYLENE GLYCOL 3350 17 G PO PACK: 17.0000 g | PACK | ORAL | Status: DC

## 2018-03-13 MED ORDER — SODIUM CHLORIDE 0.9% FLUSH
3.0000 mL | Freq: Two times a day (BID) | INTRAVENOUS | Status: DC
  Administered 2018-03-13 – 2018-03-14 (×3): 3 mL via INTRAVENOUS

## 2018-03-13 MED ORDER — PROSIGHT PO TABS
1.0000 | ORAL_TABLET | Freq: Two times a day (BID) | ORAL | Status: DC
  Administered 2018-03-14 – 2018-03-15 (×2): 1 via ORAL
  Filled 2018-03-13 (×2): qty 1

## 2018-03-13 MED ORDER — DOXAZOSIN MESYLATE 1 MG PO TABS
1.0000 mg | ORAL_TABLET | Freq: Every day | ORAL | Status: DC
  Administered 2018-03-15: 1 mg via ORAL
  Filled 2018-03-13: qty 1

## 2018-03-13 MED ORDER — SENNOSIDES-DOCUSATE SODIUM 8.6-50 MG PO TABS
2.0000 | ORAL_TABLET | Freq: Every day | ORAL | Status: DC
  Administered 2018-03-14: 2 via ORAL
  Filled 2018-03-13: qty 2

## 2018-03-13 MED ORDER — DIVALPROEX SODIUM 125 MG PO CSDR
250.0000 mg | DELAYED_RELEASE_CAPSULE | Freq: Two times a day (BID) | ORAL | Status: DC
  Administered 2018-03-14 – 2018-03-15 (×3): 250 mg via ORAL
  Filled 2018-03-13 (×5): qty 2

## 2018-03-13 MED ORDER — CARBOXYMETHYLCELLULOSE SODIUM 0.5 % OP SOLN: 2.0000 [drp] | Freq: Three times a day (TID) | OPHTHALMIC | Status: DC

## 2018-03-13 MED ORDER — PRO-STAT SUGAR FREE PO LIQD
30.0000 mL | Freq: Two times a day (BID) | ORAL | Status: DC
  Administered 2018-03-14 – 2018-03-15 (×2): 30 mL via ORAL
  Filled 2018-03-13 (×2): qty 30

## 2018-03-13 MED ORDER — SODIUM CHLORIDE 0.9 % IV SOLN
500.0000 mg | INTRAVENOUS | Status: DC
  Administered 2018-03-13 – 2018-03-15 (×2): 500 mg via INTRAVENOUS
  Filled 2018-03-13 (×3): qty 500

## 2018-03-13 MED ORDER — DIVALPROEX SODIUM 125 MG PO CSDR
500.0000 mg | DELAYED_RELEASE_CAPSULE | Freq: Every day | ORAL | Status: DC
  Administered 2018-03-14: 500 mg via ORAL
  Filled 2018-03-13 (×3): qty 4

## 2018-03-13 MED ORDER — BISOPROLOL FUMARATE 5 MG PO TABS
2.5000 mg | ORAL_TABLET | Freq: Every day | ORAL | Status: DC
  Administered 2018-03-15: 2.5 mg via ORAL
  Filled 2018-03-13: qty 1

## 2018-03-13 NOTE — ED Notes (Signed)
Bed: [facility] Expected date:  Expected time:  Means of arrival:  Comments: 

## 2018-03-13 NOTE — ED Provider Notes (Signed)
[place] DEPT Provider Note   CSN: [id number] Arrival date & time: 03/13/18  1816     History   Chief Complaint Chief Complaint  Patient presents with  . Altered Mental Status    HPI SAID RUEB is a 84 y.o. male.  84 year old male with history of dementia and Alzheimer's presents after being found on the floor.  Fall was unwitnessed and he has had increasing frequency of this.  Patient was initially unconscious but then became more arousable.  Patient has a valid DNR.  CBG was 136 and patient transported here for further evaluation.     Past Medical History:  Diagnosis Date  . Alzheimer disease    mild with some degree of paranoia  . Anxiety   . Aortic stenosis   . Arthritis    oa  . Bilateral inguinal hernia   . BPH (benign prostatic hyperplasia)   . BPH (benign prostatic hyperplasia)   . Constipation   . Constipation   . Diabetes mellitus   . Difficulty walking   . Diverticulosis   . Dysphagia   . Esophageal stricture    with dilation  . GERD (gastroesophageal reflux disease)   . Gout   . Hyperlipidemia   . Hypertension   . Peripheral neuropathy   . PVD (peripheral vascular disease) ([place])   . Renal insufficiency    stage 3 moderate  . Stroke [facility])     Patient Active Problem List   Diagnosis Date Noted  . Stricture and stenosis of esophagus 01/08/2013  . Impacted esophageal foreign body 01/08/2013  . Bilateral inguinal hernia-right recurrent 11/30/2011  . Constipation, acute 05/08/2011  . Hip pain 03/30/2011  . BPH (benign prostatic hypertrophy) with urinary retention 01/29/2011  . GOUT, UNSPECIFIED 07/28/2010  . OCCLUSION&STENOSIS BASILAR ARTERY W/INFARCT 06/08/2010  . GERD 11/21/2008  . PERSONAL HX COLONIC POLYPS 11/21/2008  . MACULAR DEGENERATION OF RETINA UNSPECIFIED 06/07/2008  . ELEVATED PROSTATE SPECIFIC ANTIGEN 06/07/2008  . HYPERLIPIDEMIA 06/09/2007  . DIABETES MELLITUS, TYPE II 02/07/2007  .  HYPERTENSION 02/07/2007  . PERIPHERAL VASCULAR DISEASE 02/07/2007  . Diverticulosis of colon (without mention of hemorrhage) 02/07/2007  . RENAL INSUFFICIENCY 02/07/2007    Past Surgical History:  Procedure Laterality Date  . ESOPHAGOGASTRODUODENOSCOPY N/A 01/08/2013   Procedure: ESOPHAGOGASTRODUODENOSCOPY (EGD);  Surgeon: Ladene Artist, MD;  Location: [facility];  Service: Endoscopy;  Laterality: N/A;  . ESOPHAGOGASTRODUODENOSCOPY (EGD) WITH PROPOFOL N/A 10/16/2015   Procedure: ESOPHAGOGASTRODUODENOSCOPY (EGD) WITH PROPOFOL;  Surgeon: [name], MD;  Location: [facility];  Service: Endoscopy;  Laterality: N/A;  DIL  . GROIN EXPLORATION Left   . INGUINAL HERNIA REPAIR Right    right inguinal  . KIDNEY STONE SURGERY    . vertebral artery stent          Home Medications    Prior to Admission medications   Medication Sig Start Date End Date Taking? Authorizing Provider  aspirin EC 81 MG tablet Take 81 mg by mouth daily.    [provider]  bisoprolol-hydrochlorothiazide (ZIAC) 2.5-6.25 MG per tablet Take 1 tablet by mouth daily. 11/23/12 10/10/15  Ricard Dillon, MD  citalopram (CELEXA) 10 MG/5ML suspension Take 10 mg by mouth daily.    [provider]  esomeprazole (NEXIUM) 40 MG packet Take 40 mg by mouth daily before breakfast. 09/15/15   Hvozdovic, Lori P, PA-C  famotidine (PEPCID AC) 10 MG chewable tablet Chew 1 tablet (10 mg total) by mouth 2 (two) times daily. 07/06/13  Ricard Dillon, MD  LORazepam (ATIVAN) 0.5 MG tablet Take 0.5 mg by mouth every 4 (four) hours as needed (agitation).    [provider]  metFORMIN (GLUMETZA) 500 MG (MOD) 24 hr tablet Take 500 mg by mouth daily with breakfast.    [provider]  Multiple Vitamins-Minerals (ICAPS MV PO) Take 1 tablet by mouth 2 (two) times daily. Reported on 07/14/2015    [provider]  polyethylene glycol powder (GLYCOLAX/MIRALAX) powder Take 255 g by mouth daily. Patient  taking differently: Take 1 Container by mouth See admin instructions. 17 grams twice daily as needed for constipation 09/15/15   Hvozdovic, Lori P, PA-C  Tamsulosin HCl (FLOMAX) 0.4 MG CAPS Take 2 capsules (0.8 mg total) by mouth daily. Patient taking differently: Take 0.4 mg by mouth daily.  07/25/12   Ricard Dillon, MD    Family History Family History  Problem Relation Age of Onset  . Diabetes Mother   . Heart disease Mother   . Colon cancer Neg Hx     Social History Social History   Tobacco Use  . Smoking status: Never Smoker  . Smokeless tobacco: Never Used  Substance Use Topics  . Alcohol use: No  . Drug use: No     Allergies   Prednisone   Review of Systems Review of Systems  Unable to perform ROS: Dementia     Physical Exam Updated Vital Signs BP (!) 99/47 (BP Location: Left Arm)   Pulse 73   Temp 99.9 F (37.7 C) (Oral)   Resp 16   Ht 1.676 m (5\' 6" )   Wt 61.7 kg   SpO2 (!) 88%   BMI 21.95 kg/m   Physical Exam  Constitutional: He appears well-developed and well-nourished. He appears lethargic.  Non-toxic appearance. No distress.  HENT:  Head: Head is with abrasion and with contusion.  Eyes: Pupils are equal, round, and reactive to light. Conjunctivae, EOM and lids are normal.  Neck: Normal range of motion. Neck supple. No tracheal deviation present. No thyroid mass present.  Cardiovascular: Normal rate, regular rhythm and normal heart sounds. Exam reveals no gallop.  No murmur heard. Pulmonary/Chest: Effort normal and breath sounds normal. No stridor. No respiratory distress. He has no decreased breath sounds. He has no wheezes. He has no rhonchi. He has no rales.  Abdominal: Soft. Normal appearance and bowel sounds are normal. He exhibits no distension. There is no tenderness. There is no rebound and no CVA tenderness.  Musculoskeletal: Normal range of motion. He exhibits no edema or tenderness.  Neurological: He appears lethargic. He is  disoriented. He displays atrophy. GCS eye subscore is 3. GCS verbal subscore is 2. GCS motor subscore is 5.  Skin: Skin is warm and dry. No abrasion and no rash noted.  Psychiatric: He is inattentive.  Nursing note and vitals reviewed.    ED Treatments / Results  Labs (all labs ordered are listed, but only abnormal results are displayed) Labs Reviewed  CULTURE, BLOOD (ROUTINE X 2)  CULTURE, BLOOD (ROUTINE X 2)  COMPREHENSIVE METABOLIC PANEL  CBC WITH DIFFERENTIAL/PLATELET  URINALYSIS, ROUTINE W REFLEX MICROSCOPIC  I-STAT CG4 LACTIC ACID, ED  CBG MONITORING, ED    EKG None  Radiology No results found.  Procedures Procedures (including critical care time)  Medications Ordered in ED Medications  acetaminophen (TYLENOL) suppository 650 mg (has no administration in time range)     Initial Impression / Assessment and Plan / ED Course  I have reviewed  the triage vital signs and the nursing notes.  Pertinent labs & imaging results that were available during my care of the patient were reviewed by me and considered in my medical decision making (see chart for details).     Patient given IV fluids here and code sepsis called.  Started on antibiotics.  Lactate noted.  Head and neck CT without acute findings.  Chest x-ray concerning for heart failure versus pneumonia.  Blood pressure has improved.  Will admit to the hospitalist service.  CRITICAL CARE Performed by: Leota Jacobsen Total critical care time: 60 minutes Critical care time was exclusive of separately billable procedures and treating other patients. Critical care was necessary to treat or prevent imminent or life-threatening deterioration. Critical care was time spent personally by me on the following activities: development of treatment plan with patient and/or surrogate as well as nursing, discussions with consultants, evaluation of patient's response to treatment, examination of patient, obtaining history from  patient or surrogate, ordering and performing treatments and interventions, ordering and review of laboratory studies, ordering and review of radiographic studies, pulse oximetry and re-evaluation of patient's condition.   Final Clinical Impressions(s) / ED Diagnoses   Final diagnoses:  None    ED Discharge Orders    None       Lacretia Leigh, MD 03/13/18 2005

## 2018-03-13 NOTE — ED Notes (Signed)
Will obtain an EKG once patient returns from CT and X-ray.

## 2018-03-13 NOTE — ED Notes (Signed)
Placed on 2L [place]  

## 2018-03-13 NOTE — ED Notes (Signed)
MD notified of I-stat lactic acid.

## 2018-03-13 NOTE — H&P (Addendum)
TRH H&P   Patient Demographics:    Don Montoya, is a 84 y.o. male  MRN: [id number]   DOB - 09-06-1914  Admit Date - 03/13/2018  Outpatient Primary MD for the patient is Daylene Posey, MD  Referring MD/NP/PA:  Vivi Martens  Outpatient Specialists:     Patient coming from:  Home    Chief Complaint  Patient presents with  . Altered Mental Status      HPI:    Don Montoya  is a 84 y.o. male, w dementia , hypertension, hyperlipidemia, pvd, CVA,  apparently c/o AMS.  Pt had 2 falls and hit his head and therefore sent to ER for evaluation.    In ED,  CT brain/ C spine IMPRESSION: Progressive atrophy and small vessel chronic ischemic changes of deep cerebral white matter.  No acute intracranial abnormalities.  Multilevel degenerative disc and facet disease changes of the cervical spine.  No acute cervical spine abnormalities.  CXR IMPRESSION: 1. Probable mild bibasilar interstitial edema. 2. Calcified pleural plaques again noted bilaterally, compatible with prior asbestos exposure. 3. No osseous fracture or dislocation seen.  Na 140, K 5.3, Bun 29, Creatinine 1.63 Alb 2.8 Ast 31, Alt 8 Alk phos 39, T. Bili 1.4  Wbc 17.6, Hgb 10.3, Plt 183 BNP 374   Lactic acid 3.17  Pt will be admitted for falls  , ? AMS    Review of systems:    In addition to the HPI above, unable to obtain due to dementia No Fever-chills, No Headache, No changes with Vision or hearing, No problems swallowing food or Liquids, No Chest pain, no Cough or Shortness of Breath No Abdominal pain, No Nausea or Vommitting, Bowel movements are regular, No Blood in stool or Urine, No dysuria, No new skin rashes or bruises, No new joints pains-aches,  No new weakness, tingling, numbness in any extremity, No recent weight gain or loss, No polyuria, polydypsia or polyphagia, No significant  Mental Stressors.  A full 10 point Review of Systems was done, except as stated above, all other Review of Systems were negative.   With Past History of the following :    Past Medical History:  Diagnosis Date  . Alzheimer disease    mild with some degree of paranoia  . Anxiety   . Aortic stenosis   . Arthritis    oa  . Bilateral inguinal hernia   . BPH (benign prostatic hyperplasia)   . BPH (benign prostatic hyperplasia)   . Constipation   . Constipation   . Diabetes mellitus   . Difficulty walking   . Diverticulosis   . Dysphagia   . Esophageal stricture    with dilation  . GERD (gastroesophageal reflux disease)   . Gout   . Hyperlipidemia   . Hypertension   . Peripheral neuropathy   . PVD (peripheral vascular disease) ([place])   . Renal  insufficiency    stage 3 moderate  . Stroke [facility])       Past Surgical History:  Procedure Laterality Date  . ESOPHAGOGASTRODUODENOSCOPY N/A 01/08/2013   Procedure: ESOPHAGOGASTRODUODENOSCOPY (EGD);  Surgeon: Ladene Artist, MD;  Location: [facility];  Service: Endoscopy;  Laterality: N/A;  . ESOPHAGOGASTRODUODENOSCOPY (EGD) WITH PROPOFOL N/A 10/16/2015   Procedure: ESOPHAGOGASTRODUODENOSCOPY (EGD) WITH PROPOFOL;  Surgeon: [name], MD;  Location: [facility];  Service: Endoscopy;  Laterality: N/A;  DIL  . GROIN EXPLORATION Left   . INGUINAL HERNIA REPAIR Right    right inguinal  . KIDNEY STONE SURGERY    . vertebral artery stent        Social History:     Social History   Tobacco Use  . Smoking status: Never Smoker  . Smokeless tobacco: Never Used  Substance Use Topics  . Alcohol use: No     Lives - at SNF, [facility]  Mobility - unclear   Family History :     Family History  Problem Relation Age of Onset  . Diabetes Mother   . Heart disease Mother   . Colon cancer Neg Hx        Home Medications:   Prior to Admission medications   Medication Sig Start Date End Date Taking? Authorizing Provider    aspirin EC 81 MG tablet Take 81 mg by mouth daily.    [provider]  bisoprolol-hydrochlorothiazide (ZIAC) 2.5-6.25 MG per tablet Take 1 tablet by mouth daily. 11/23/12 10/10/15  Ricard Dillon, MD  citalopram (CELEXA) 10 MG/5ML suspension Take 10 mg by mouth daily.    [provider]  esomeprazole (NEXIUM) 40 MG packet Take 40 mg by mouth daily before breakfast. 09/15/15   Hvozdovic, Lori P, PA-C  famotidine (PEPCID AC) 10 MG chewable tablet Chew 1 tablet (10 mg total) by mouth 2 (two) times daily. 07/06/13   Ricard Dillon, MD  LORazepam (ATIVAN) 0.5 MG tablet Take 0.5 mg by mouth every 4 (four) hours as needed (agitation).    [provider]  metFORMIN (GLUMETZA) 500 MG (MOD) 24 hr tablet Take 500 mg by mouth daily with breakfast.    [provider]  Multiple Vitamins-Minerals (ICAPS MV PO) Take 1 tablet by mouth 2 (two) times daily. Reported on 07/14/2015    [provider]  polyethylene glycol powder (GLYCOLAX/MIRALAX) powder Take 255 g by mouth daily. Patient taking differently: Take 1 Container by mouth See admin instructions. 17 grams twice daily as needed for constipation 09/15/15   Hvozdovic, Lori P, PA-C  Tamsulosin HCl (FLOMAX) 0.4 MG CAPS Take 2 capsules (0.8 mg total) by mouth daily. Patient taking differently: Take 0.4 mg by mouth daily.  07/25/12   Ricard Dillon, MD     Allergies:     Allergies  Allergen Reactions  . Prednisone Other (See Comments)    psycosis     Physical Exam:   Vitals  Blood pressure 98/80, pulse 79, temperature 99.9 F (37.7 C), temperature source Oral, resp. rate (!) 23, height 5' 6"  (3.785 m), weight 61.7 kg, SpO2 99 %.   1. General  lying in bed in NAD,    2. Normal affect and insight, Not Suicidal or Homicidal, Awake Alert, Oriented X 1.  3. No F.N deficits, ALL C.Nerves Intact, Strength 5/5 all 4 extremities, Sensation intact all 4 extremities, Plantars down going.  4. Ears and Eyes appear  Normal, Conjunctivae clear, PERRLA. Moist Oral Mucosa.  5. Supple Neck,  No JVD, No cervical lymphadenopathy appriciated, No Carotid Bruits.  6. Symmetrical Chest wall movement, Good air movement bilaterally, CTAB.  7. RRR, No Gallops, Rubs or Murmurs, No Parasternal Heave.  8. Positive Bowel Sounds, Abdomen Soft, No tenderness, No organomegaly appriciated,No rebound -guarding or rigidity.  9.  No Cyanosis, Normal Skin Turgor, No Skin Rash or Bruise.  10. Good muscle tone,  joints appear normal , no effusions, Normal ROM.  11. No Palpable Lymph Nodes in Neck or Axillae    Data Review:    CBC Recent Labs  Lab 03/13/18 1853  WBC 17.6*  HGB 10.3*  HCT 31.8*  PLT 183  MCV 96.7  MCH 31.3  MCHC 32.4  RDW 14.0  LYMPHSABS 0.9  MONOABS 1.8*  EOSABS 0.0  BASOSABS 0.0   ------------------------------------------------------------------------------------------------------------------  Chemistries  Recent Labs  Lab 03/13/18 1853  NA 140  K 5.3*  CL 104  CO2 22  GLUCOSE 162*  BUN 29*  CREATININE 1.63*  CALCIUM 8.6*  AST 31  ALT 8  ALKPHOS 39  BILITOT 1.4*   ------------------------------------------------------------------------------------------------------------------ estimated creatinine clearance is 21 mL/min (A) (by C-G formula based on SCr of 1.63 mg/dL (H)). ------------------------------------------------------------------------------------------------------------------ No results for input(s): TSH, T4TOTAL, T3FREE, THYROIDAB in the last 72 hours.  Invalid input(s): FREET3  Coagulation profile No results for input(s): INR, PROTIME in the last 168 hours. ------------------------------------------------------------------------------------------------------------------- No results for input(s): DDIMER in the last 72 hours. -------------------------------------------------------------------------------------------------------------------  Cardiac Enzymes No  results for input(s): CKMB, TROPONINI, MYOGLOBIN in the last 168 hours.  Invalid input(s): CK ------------------------------------------------------------------------------------------------------------------ No results found for: BNP   ---------------------------------------------------------------------------------------------------------------  Urinalysis    Component Value Date/Time   COLORURINE YELLOW 07/23/2015 0409   APPEARANCEUR CLEAR 07/23/2015 0409   LABSPEC 1.017 07/23/2015 0409   PHURINE 5.5 07/23/2015 0409   GLUCOSEU NEGATIVE 07/23/2015 0409   HGBUR NEGATIVE 07/23/2015 0409   BILIRUBINUR NEGATIVE 07/23/2015 0409   BILIRUBINUR negative 07/14/2015 1312   BILIRUBINUR neg 01/15/2012 1000   KETONESUR NEGATIVE 07/23/2015 0409   PROTEINUR NEGATIVE 07/23/2015 0409   UROBILINOGEN 0.2 07/14/2015 1312   UROBILINOGEN 0.2 11/05/2011 1528   NITRITE NEGATIVE 07/23/2015 0409   LEUKOCYTESUR NEGATIVE 07/23/2015 0409    ----------------------------------------------------------------------------------------------------------------   Imaging Results:    Dg Chest 2 View  Result Date: 03/13/2018 CLINICAL DATA:  Increasing falls.  Found down today. EXAM: CHEST - 2 VIEW COMPARISON:  Chest x-ray dated 09/01/2015. FINDINGS: Heart size and mediastinal contours are stable. Calcified pleural plaques again noted bilaterally. Probable mild bilateral interstitial edema, best seen on the lateral projection. No confluent opacity to suggest a developing pneumonia. No pleural effusion or pneumothorax seen. No acute or suspicious osseous finding. IMPRESSION: 1. Probable mild bibasilar interstitial edema. 2. Calcified pleural plaques again noted bilaterally, compatible with prior asbestos exposure. 3. No osseous fracture or dislocation seen. Electronically Signed   By: Franki Cabot M.D.   On: 03/13/2018 19:21   Ct Head Wo Contrast  Result Date: 03/13/2018 CLINICAL DATA:  Increasing falls,  posttraumatic headache, fell twice this afternoon, found unconscious after being alone for 10 minutes, abrasion and bruising to forehead, history Alzheimer's, diabetes mellitus, hypertension EXAM: CT HEAD WITHOUT CONTRAST CT CERVICAL SPINE WITHOUT CONTRAST TECHNIQUE: Multidetector CT imaging of the head and cervical spine was performed following the standard protocol without intravenous contrast. Multiplanar CT image reconstructions of the cervical spine were also generated. COMPARISON:  12/01/2007 CT head FINDINGS: CT HEAD FINDINGS Brain: Generalized atrophy increased since previous exam. Normal ventricular morphology. No midline shift  or mass effect. Small vessel chronic ischemic changes of deep cerebral white matter progressive since 2009. No intracranial hemorrhage, mass lesion, evidence of acute infarction, or extra-axial fluid collection. Vascular: Atherosclerotic calcifications of internal carotid arteries bilaterally at skull base. Stent identified at LEFT vertebral artery. Skull: Intact Sinuses/Orbits: Clear Other: N/A CT CERVICAL SPINE FINDINGS Alignment: Minimal anterolisthesis at C3-C4 and C7-T1. Minimal retrolisthesis C4-C5. Skull base and vertebrae: Osseous mineralization appears mildly decreased. Skull base intact. Multilevel disc space narrowing and endplate spur formation. Multilevel facet degenerative changes. Vertebral body heights maintained without fracture or bone destruction. Encroachment upon cervical neural foramina bilaterally at multiple levels by uncovertebral spurs. Soft tissues and spinal canal: Prevertebral soft tissues normal thickness. Atherosclerotic calcifications of the carotid bifurcations bilaterally. Tiny nonspecific RIGHT thyroid nodule. Cervical soft tissues otherwise unremarkable. Disc levels:  No gross disc herniation Upper chest: Lung apices clear Other: N/A IMPRESSION: Progressive atrophy and small vessel chronic ischemic changes of deep cerebral white matter. No acute  intracranial abnormalities. Multilevel degenerative disc and facet disease changes of the cervical spine. No acute cervical spine abnormalities. Electronically Signed   By: Lavonia Dana M.D.   On: 03/13/2018 19:46   Ct Cervical Spine Wo Contrast  Result Date: 03/13/2018 CLINICAL DATA:  Increasing falls, posttraumatic headache, fell twice this afternoon, found unconscious after being alone for 10 minutes, abrasion and bruising to forehead, history Alzheimer's, diabetes mellitus, hypertension EXAM: CT HEAD WITHOUT CONTRAST CT CERVICAL SPINE WITHOUT CONTRAST TECHNIQUE: Multidetector CT imaging of the head and cervical spine was performed following the standard protocol without intravenous contrast. Multiplanar CT image reconstructions of the cervical spine were also generated. COMPARISON:  12/01/2007 CT head FINDINGS: CT HEAD FINDINGS Brain: Generalized atrophy increased since previous exam. Normal ventricular morphology. No midline shift or mass effect. Small vessel chronic ischemic changes of deep cerebral white matter progressive since 2009. No intracranial hemorrhage, mass lesion, evidence of acute infarction, or extra-axial fluid collection. Vascular: Atherosclerotic calcifications of internal carotid arteries bilaterally at skull base. Stent identified at LEFT vertebral artery. Skull: Intact Sinuses/Orbits: Clear Other: N/A CT CERVICAL SPINE FINDINGS Alignment: Minimal anterolisthesis at C3-C4 and C7-T1. Minimal retrolisthesis C4-C5. Skull base and vertebrae: Osseous mineralization appears mildly decreased. Skull base intact. Multilevel disc space narrowing and endplate spur formation. Multilevel facet degenerative changes. Vertebral body heights maintained without fracture or bone destruction. Encroachment upon cervical neural foramina bilaterally at multiple levels by uncovertebral spurs. Soft tissues and spinal canal: Prevertebral soft tissues normal thickness. Atherosclerotic calcifications of the carotid  bifurcations bilaterally. Tiny nonspecific RIGHT thyroid nodule. Cervical soft tissues otherwise unremarkable. Disc levels:  No gross disc herniation Upper chest: Lung apices clear Other: N/A IMPRESSION: Progressive atrophy and small vessel chronic ischemic changes of deep cerebral white matter. No acute intracranial abnormalities. Multilevel degenerative disc and facet disease changes of the cervical spine. No acute cervical spine abnormalities. Electronically Signed   By: Lavonia Dana M.D.   On: 03/13/2018 19:46    ekg nsr at 90, nl axis, 1st degree avb, RBBB   Assessment & Plan:    Active Problems:   Diabetes mellitus without complication (HCC)   Hyperkalemia   Renal insufficiency   Severe protein-calorie malnutrition ([place])   AMS MRI brain r/o CVA Check b12, folate , esr , rpr, tsh  Elevated BNP, brief Hypoxia Check CT chest Check trop I q6h x3 Check cardiac echo  Repeated Fall secondary to dementia PT/OT to evaluate and tx  Hypertension Cont bisoprolol 2.29m po qday  Dementia Cont Celexa  Cont Depakote Check depakote level Cont Lorazepam, may need to try to reduce of discontinue due to increase fall risk with this medication  Gerd Cont PPI  BPH Cont Flomax    DVT Prophylaxis Lovenox - SCDs  AM Labs Ordered, also please review Full Orders  Family Communication: Admission, patients condition and plan of care including tests being ordered have been discussed with the patient  who indicate understanding and agree with the plan and Code Status.  Code Status  DNR, comes w yellow DNR from SNF  Likely DC to  home  Condition GUARDED    Consults called: none  Admission status: observation   Time spent in minutes : 70   Jani Gravel M.D on 03/13/2018 at 8:25 PM  Between 7am to 7pm - Pager - [phone]  . After 7pm go to [email or web address] - password [facility]  [facility] - Office  [phone]

## 2018-03-13 NOTE — ED Notes (Signed)
Bed: KA76 Expected date: 03/13/18 Expected time: 6:34 PM Means of arrival:  Comments: Don Montoya

## 2018-03-13 NOTE — ED Notes (Signed)
ED TO INPATIENT HANDOFF REPORT  Name/Age/Gender Don Montoya 84 y.o. male  Code Status Advance Directive Documentation     Most Recent Value  Type of Advance Directive  Out of facility DNR (pink MOST or yellow form)  Pre-existing out of facility DNR order (yellow form or pink MOST form)  Yellow form placed in chart (order not valid for inpatient use)  "MOST" Form in Place?  -      Home/SNF/Other Skilled nursing facility  Chief Complaint Fall  Level of Care/Admitting Diagnosis ED Disposition    ED Disposition Condition [place]: [place] [[id number]]  Level of Care: Telemetry [5]  Admit to tele based on following criteria: Monitor for Ischemic changes  Diagnosis: Fever [903833]  Admitting Physician: Jani Gravel [[id number]]  Attending Physician: Jani Gravel [phone]  Estimated length of stay: past midnight tomorrow  Certification:: I certify this patient will need inpatient services for at least 2 midnights  PT Class (Do Not Modify): Inpatient [101]  PT Acc Code (Do Not Modify): Private [1]       Medical History Past Medical History:  Diagnosis Date  . Alzheimer disease    mild with some degree of paranoia  . Anxiety   . Aortic stenosis   . Arthritis    oa  . Bilateral inguinal hernia   . BPH (benign prostatic hyperplasia)   . BPH (benign prostatic hyperplasia)   . Constipation   . Constipation   . Diabetes mellitus   . Difficulty walking   . Diverticulosis   . Dysphagia   . Esophageal stricture    with dilation  . GERD (gastroesophageal reflux disease)   . Gout   . Hyperlipidemia   . Hypertension   . Peripheral neuropathy   . PVD (peripheral vascular disease) ([place])   . Renal insufficiency    stage 3 moderate  . Stroke [facility])     Allergies Allergies  Allergen Reactions  . Prednisone Other (See Comments)    psycosis    IV Location/Drains/Wounds Patient Lines/Drains/Airways Status   Active Line/Drains/Airways     Name:   Placement date:   Placement time:   Site:   Days:   Peripheral IV 03/13/18 Left Forearm   03/13/18    -    Forearm   less than 1   Peripheral IV 03/13/18 Left Forearm   03/13/18    2020    Forearm   less than 1          Labs/Imaging Results for orders placed or performed during the hospital encounter of 03/13/18 (from the past 48 hour(s))  Comprehensive metabolic panel     Status: Abnormal   Collection Time: 03/13/18  6:53 PM  Result Value Ref Range   Sodium 140 135 - 145 mmol/L   Potassium 5.3 (H) 3.5 - 5.1 mmol/L    Comment: SLIGHT HEMOLYSIS   Chloride 104 98 - 111 mmol/L   CO2 22 22 - 32 mmol/L   Glucose, Bld 162 (H) 70 - 99 mg/dL   BUN 29 (H) 8 - 23 mg/dL   Creatinine, Ser 1.63 (H) 0.61 - 1.24 mg/dL   Calcium 8.6 (L) 8.9 - 10.3 mg/dL   Total Protein 5.8 (L) 6.5 - 8.1 g/dL   Albumin 2.8 (L) 3.5 - 5.0 g/dL   AST 31 15 - 41 U/L   ALT 8 0 - 44 U/L   Alkaline Phosphatase 39 38 - 126 U/L   Total Bilirubin  1.4 (H) 0.3 - 1.2 mg/dL   GFR calc non Af Amer 33 (L) >60 mL/min   GFR calc Af Amer 38 (L) >60 mL/min    Comment: (NOTE) The eGFR has been calculated using the CKD EPI equation. This calculation has not been validated in all clinical situations. eGFR's persistently <60 mL/min signify possible Chronic Kidney Disease.    Anion gap 14 5 - 15    Comment: Performed at [facility], [place] [address], [place], [place] [address]  CBC with Differential     Status: Abnormal   Collection Time: 03/13/18  6:53 PM  Result Value Ref Range   WBC 17.6 (H) 4.0 - 10.5 K/uL   RBC 3.29 (L) 4.22 - 5.81 MIL/uL   Hemoglobin 10.3 (L) 13.0 - 17.0 g/dL   HCT 31.8 (L) 39.0 - 52.0 %   MCV 96.7 78.0 - 100.0 fL   MCH 31.3 26.0 - 34.0 pg   MCHC 32.4 30.0 - 36.0 g/dL   RDW 14.0 11.5 - 15.5 %   Platelets 183 150 - 400 K/uL   Neutrophils Relative % 85 %   Neutro Abs 14.8 (H) 1.7 - 7.7 K/uL   Lymphocytes Relative 5 %   Lymphs Abs 0.9 0.7 - 4.0 K/uL   Monocytes Relative 10  %   Monocytes Absolute 1.8 (H) 0.1 - 1.0 K/uL   Eosinophils Relative 0 %   Eosinophils Absolute 0.0 0.0 - 0.7 K/uL   Basophils Relative 0 %   Basophils Absolute 0.0 0.0 - 0.1 K/uL    Comment: Performed at [facility], [place] [address], [place], [place] [address]  I-Stat CG4 Lactic Acid, ED     Status: Abnormal   Collection Time: 03/13/18  7:01 PM  Result Value Ref Range   Lactic Acid, Venous 3.17 (HH) 0.5 - 1.9 mmol/L   Comment NOTIFIED PHYSICIAN   CBG monitoring, ED     Status: None   Collection Time: 03/13/18  8:23 PM  Result Value Ref Range   Glucose-Capillary 81 70 - 99 mg/dL  I-Stat CG4 Lactic Acid, ED  (not at  [facility])     Status: Abnormal   Collection Time: 03/13/18  8:29 PM  Result Value Ref Range   Lactic Acid, Venous <0.30 (L) 0.5 - 1.9 mmol/L   Dg Chest 2 View  Result Date: 03/13/2018 CLINICAL DATA:  Increasing falls.  Found down today. EXAM: CHEST - 2 VIEW COMPARISON:  Chest x-ray dated 09/01/2015. FINDINGS: Heart size and mediastinal contours are stable. Calcified pleural plaques again noted bilaterally. Probable mild bilateral interstitial edema, best seen on the lateral projection. No confluent opacity to suggest a developing pneumonia. No pleural effusion or pneumothorax seen. No acute or suspicious osseous finding. IMPRESSION: 1. Probable mild bibasilar interstitial edema. 2. Calcified pleural plaques again noted bilaterally, compatible with prior asbestos exposure. 3. No osseous fracture or dislocation seen. Electronically Signed   By: Franki Cabot M.D.   On: 03/13/2018 19:21   Ct Head Wo Contrast  Result Date: 03/13/2018 CLINICAL DATA:  Increasing falls, posttraumatic headache, fell twice this afternoon, found unconscious after being alone for 10 minutes, abrasion and bruising to forehead, history Alzheimer's, diabetes mellitus, hypertension EXAM: CT HEAD WITHOUT CONTRAST CT CERVICAL SPINE WITHOUT CONTRAST TECHNIQUE: Multidetector CT imaging of the head  and cervical spine was performed following the standard protocol without intravenous contrast. Multiplanar CT image reconstructions of the cervical spine were also generated. COMPARISON:  12/01/2007 CT head FINDINGS: CT HEAD FINDINGS  Brain: Generalized atrophy increased since previous exam. Normal ventricular morphology. No midline shift or mass effect. Small vessel chronic ischemic changes of deep cerebral white matter progressive since 2009. No intracranial hemorrhage, mass lesion, evidence of acute infarction, or extra-axial fluid collection. Vascular: Atherosclerotic calcifications of internal carotid arteries bilaterally at skull base. Stent identified at LEFT vertebral artery. Skull: Intact Sinuses/Orbits: Clear Other: N/A CT CERVICAL SPINE FINDINGS Alignment: Minimal anterolisthesis at C3-C4 and C7-T1. Minimal retrolisthesis C4-C5. Skull base and vertebrae: Osseous mineralization appears mildly decreased. Skull base intact. Multilevel disc space narrowing and endplate spur formation. Multilevel facet degenerative changes. Vertebral body heights maintained without fracture or bone destruction. Encroachment upon cervical neural foramina bilaterally at multiple levels by uncovertebral spurs. Soft tissues and spinal canal: Prevertebral soft tissues normal thickness. Atherosclerotic calcifications of the carotid bifurcations bilaterally. Tiny nonspecific RIGHT thyroid nodule. Cervical soft tissues otherwise unremarkable. Disc levels:  No gross disc herniation Upper chest: Lung apices clear Other: N/A IMPRESSION: Progressive atrophy and small vessel chronic ischemic changes of deep cerebral white matter. No acute intracranial abnormalities. Multilevel degenerative disc and facet disease changes of the cervical spine. No acute cervical spine abnormalities. Electronically Signed   By: Lavonia Dana M.D.   On: 03/13/2018 19:46   Ct Cervical Spine Wo Contrast  Result Date: 03/13/2018 CLINICAL DATA:  Increasing falls,  posttraumatic headache, fell twice this afternoon, found unconscious after being alone for 10 minutes, abrasion and bruising to forehead, history Alzheimer's, diabetes mellitus, hypertension EXAM: CT HEAD WITHOUT CONTRAST CT CERVICAL SPINE WITHOUT CONTRAST TECHNIQUE: Multidetector CT imaging of the head and cervical spine was performed following the standard protocol without intravenous contrast. Multiplanar CT image reconstructions of the cervical spine were also generated. COMPARISON:  12/01/2007 CT head FINDINGS: CT HEAD FINDINGS Brain: Generalized atrophy increased since previous exam. Normal ventricular morphology. No midline shift or mass effect. Small vessel chronic ischemic changes of deep cerebral white matter progressive since 2009. No intracranial hemorrhage, mass lesion, evidence of acute infarction, or extra-axial fluid collection. Vascular: Atherosclerotic calcifications of internal carotid arteries bilaterally at skull base. Stent identified at LEFT vertebral artery. Skull: Intact Sinuses/Orbits: Clear Other: N/A CT CERVICAL SPINE FINDINGS Alignment: Minimal anterolisthesis at C3-C4 and C7-T1. Minimal retrolisthesis C4-C5. Skull base and vertebrae: Osseous mineralization appears mildly decreased. Skull base intact. Multilevel disc space narrowing and endplate spur formation. Multilevel facet degenerative changes. Vertebral body heights maintained without fracture or bone destruction. Encroachment upon cervical neural foramina bilaterally at multiple levels by uncovertebral spurs. Soft tissues and spinal canal: Prevertebral soft tissues normal thickness. Atherosclerotic calcifications of the carotid bifurcations bilaterally. Tiny nonspecific RIGHT thyroid nodule. Cervical soft tissues otherwise unremarkable. Disc levels:  No gross disc herniation Upper chest: Lung apices clear Other: N/A IMPRESSION: Progressive atrophy and small vessel chronic ischemic changes of deep cerebral white matter. No acute  intracranial abnormalities. Multilevel degenerative disc and facet disease changes of the cervical spine. No acute cervical spine abnormalities. Electronically Signed   By: Lavonia Dana M.D.   On: 03/13/2018 19:46    Pending Labs Unresulted Labs (From admission, onward)    Start     Ordered   03/13/18 2002  Brain natriuretic peptide  Once,   R     03/13/18 2001   03/13/18 2002  Troponin I  STAT,   STAT     03/13/18 2001   03/13/18 1837  Culture, blood (Routine x 2)  BLOOD CULTURE X 2,   STAT     03/13/18 1837   03/13/18  1837  Urinalysis, Routine w reflex microscopic  STAT,   STAT     03/13/18 1837          Vitals/Pain Today's Vitals   03/13/18 1930 03/13/18 1936 03/13/18 2003 03/13/18 2023  BP: (!) 93/44 (!) 94/44 98/80 (!) 99/50  Pulse: 76 76 79 81  Resp: (!) 27 (!) 9 (!) 23 (!) 24  Temp:      TempSrc:      SpO2: 91% (!) 89% 99% 100%  Weight:      Height:        Isolation Precautions No active isolations  Medications Medications  cefTRIAXone (ROCEPHIN) 2 g in sodium chloride 0.9 % 100 mL IVPB (2 g Intravenous New Bag/Given 03/13/18 2050)  azithromycin (ZITHROMAX) 500 mg in sodium chloride 0.9 % 250 mL IVPB (500 mg Intravenous New Bag/Given 03/13/18 2056)  acetaminophen (TYLENOL) suppository 650 mg (650 mg Rectal Given 03/13/18 1940)  sodium chloride 0.9 % bolus 1,000 mL (0 mLs Intravenous Stopped 03/13/18 2018)    And  sodium chloride 0.9 % bolus 1,000 mL (0 mLs Intravenous Stopped 03/13/18 2108)    Mobility non-ambulatory

## 2018-03-13 NOTE — ED Triage Notes (Signed)
Patient BIB EMS from [facility]. Per EMS report pt has been complaining of increase in falls. Per EMS report, nursing home stated patient had x2 falls this shift. This afternoon, patient was found unconscious at nursing home on the floor- patient had been alone for approx 10 min. Abrasion and bruising noted to forehead. Hx of alzheimer's - unsure of neuro baseline. Pt combative in triage. Pt has golden ticket DNR at bedside. Pt CBG was 136, HR 80, oxygen sat 93% on room air, BP 105/51 per EMS.

## 2018-03-13 NOTE — Progress Notes (Signed)
CRITICAL VALUE ALERT  Critical Value: troponin 0.03  Date & Time Notied:  03/13/18 2231  Provider Notified: [place]  Orders Received/Actions taken:

## 2018-03-14 ENCOUNTER — Inpatient Hospital Stay (HOSPITAL_COMMUNITY): Payer: Medicare Other

## 2018-03-14 DIAGNOSIS — I361 Nonrheumatic tricuspid (valve) insufficiency: Secondary | ICD-10-CM

## 2018-03-14 DIAGNOSIS — Z7189 Other specified counseling: Secondary | ICD-10-CM

## 2018-03-14 DIAGNOSIS — E43 Unspecified severe protein-calorie malnutrition: Secondary | ICD-10-CM

## 2018-03-14 DIAGNOSIS — J189 Pneumonia, unspecified organism: Secondary | ICD-10-CM

## 2018-03-14 DIAGNOSIS — Z515 Encounter for palliative care: Secondary | ICD-10-CM

## 2018-03-14 DIAGNOSIS — I371 Nonrheumatic pulmonary valve insufficiency: Secondary | ICD-10-CM

## 2018-03-14 LAB — ECHOCARDIOGRAM COMPLETE
HEIGHTINCHES: 66 in
WEIGHTICAEL: 2067.03 [oz_av]

## 2018-03-14 LAB — COMPREHENSIVE METABOLIC PANEL
ALK PHOS: 34 U/L — AB (ref 38–126)
ALT: 9 U/L (ref 0–44)
AST: 17 U/L (ref 15–41)
Albumin: 2.4 g/dL — ABNORMAL LOW (ref 3.5–5.0)
Anion gap: 9 (ref 5–15)
BILIRUBIN TOTAL: 0.4 mg/dL (ref 0.3–1.2)
BUN: 31 mg/dL — ABNORMAL HIGH (ref 8–23)
CALCIUM: 7.9 mg/dL — AB (ref 8.9–10.3)
CO2: 24 mmol/L (ref 22–32)
CREATININE: 1.4 mg/dL — AB (ref 0.61–1.24)
Chloride: 114 mmol/L — ABNORMAL HIGH (ref 98–111)
GFR calc non Af Amer: 40 mL/min — ABNORMAL LOW (ref >60)
GFR, EST AFRICAN AMERICAN: 46 mL/min — AB (ref >60)
GLUCOSE: 157 mg/dL — AB (ref 70–99)
Potassium: 5.1 mmol/L (ref 3.5–5.1)
SODIUM: 147 mmol/L — AB (ref 135–145)
TOTAL PROTEIN: 5.4 g/dL — AB (ref 6.5–8.1)

## 2018-03-14 LAB — CBC
HCT: 29 % — ABNORMAL LOW (ref 39.0–52.0)
Hemoglobin: 9.3 g/dL — ABNORMAL LOW (ref 13.0–17.0)
MCH: 31.5 pg (ref 26.0–34.0)
MCHC: 32.1 g/dL (ref 30.0–36.0)
MCV: 98.3 fL (ref 78.0–100.0)
PLATELETS: 149 10*3/uL — AB (ref 150–400)
RBC: 2.95 MIL/uL — ABNORMAL LOW (ref 4.22–5.81)
RDW: 14.1 % (ref 11.5–15.5)
WBC: 16.1 10*3/uL — ABNORMAL HIGH (ref 4.0–10.5)

## 2018-03-14 LAB — TROPONIN I
TROPONIN I: 0.03 ng/mL — AB (ref <0.03)
Troponin I: 0.06 ng/mL (ref <0.03)

## 2018-03-14 LAB — MRSA PCR SCREENING: MRSA by PCR: NEGATIVE

## 2018-03-14 LAB — RPR: RPR Ser Ql: NONREACTIVE

## 2018-03-14 MED ORDER — OLANZAPINE 5 MG PO TBDP
2.5000 mg | ORAL_TABLET | Freq: Once | ORAL | Status: AC
  Administered 2018-03-14: 2.5 mg via ORAL
  Filled 2018-03-14: qty 0.5

## 2018-03-14 MED ORDER — ORAL CARE MOUTH RINSE: 15.0000 mL | Freq: Two times a day (BID) | OROMUCOSAL | Status: DC

## 2018-03-14 MED ORDER — LORAZEPAM 2 MG/ML IJ SOLN
0.5000 mg | Freq: Once | INTRAMUSCULAR | Status: AC
  Administered 2018-03-14: 0.5 mg via INTRAVENOUS
  Filled 2018-03-14: qty 1

## 2018-03-14 NOTE — Progress Notes (Signed)
  Echocardiogram 2D Echocardiogram has been performed.  Don Montoya 03/14/2018, 3:14 PM

## 2018-03-14 NOTE — Plan of Care (Signed)
  Problem: Elimination: Goal: Will not experience complications related to urinary retention Outcome: Progressing   Problem: Pain Managment: Goal: General experience of comfort will improve Outcome: Progressing   Problem: Safety: Goal: Ability to remain free from injury will improve Outcome: Progressing   

## 2018-03-14 NOTE — Evaluation (Signed)
Clinical/Bedside Swallow Evaluation Patient Details  Name: Don Montoya MRN: [id number] Date of Birth: [birth year]  Today's Date: 03/14/2018 Time: SLP Start Time (ACUTE ONLY): 1045 SLP Stop Time (ACUTE ONLY): 1125 SLP Time Calculation (min) (ACUTE ONLY): 40 min  Past Medical History:  Past Medical History:  Diagnosis Date  . Alzheimer disease    mild with some degree of paranoia  . Anxiety   . Aortic stenosis   . Arthritis    oa  . Bilateral inguinal hernia   . BPH (benign prostatic hyperplasia)   . BPH (benign prostatic hyperplasia)   . Constipation   . Constipation   . Diabetes mellitus   . Difficulty walking   . Diverticulosis   . Dysphagia   . Esophageal stricture    with dilation  . GERD (gastroesophageal reflux disease)   . Gout   . Hyperlipidemia   . Hypertension   . Peripheral neuropathy   . PVD (peripheral vascular disease) ([place])   . Renal insufficiency    stage 3 moderate  . Stroke [facility])    Past Surgical History:  Past Surgical History:  Procedure Laterality Date  . ESOPHAGOGASTRODUODENOSCOPY N/A 01/08/2013   Procedure: ESOPHAGOGASTRODUODENOSCOPY (EGD);  Surgeon: Ladene Artist, MD;  Location: [facility];  Service: Endoscopy;  Laterality: N/A;  . ESOPHAGOGASTRODUODENOSCOPY (EGD) WITH PROPOFOL N/A 10/16/2015   Procedure: ESOPHAGOGASTRODUODENOSCOPY (EGD) WITH PROPOFOL;  Surgeon: [name], MD;  Location: [facility] ENDOSCOPY;  Service: Endoscopy;  Laterality: N/A;  DIL  . GROIN EXPLORATION Left   . INGUINAL HERNIA REPAIR Right    right inguinal  . KIDNEY STONE SURGERY    . vertebral artery stent     HPI:  84 year old male admitted 03/13/18 with AMS. PMH: dementia, HTN, HLD, GERD, PVD, CVA, falls x2. Head CT = progressive atrophy. CXR = BLL interstitial edema. Pt leaving for MRI at this time. Congested nonproductive cough noted.   Assessment / Plan / Recommendation Clinical Impression  Pt was seen at bedside for assessment of swallow function and diet  recommendations. Oral cavity was noted to be edentulous, with a few red spots on hard palate. Oral care completed with suction. Pt accepted trials of thin liquid and puree, and appeared to tolerate each without oral difficulty or delay, and no overt s/s aspiration. Given advanced age, dementia, history of dysphagia and GERD, pt is at significantly increased risk for aspiration. Will begin conservative diet of puree and thin liquids with crushed meds, and follow for diet tolerance and education. Recommend consideration of palliative care consult to facilitate establishment of goals of care.   SLP Visit Diagnosis: Dysphagia, unspecified (R13.10)    Aspiration Risk  Moderate aspiration risk    Diet Recommendation Dysphagia 1 (Puree);Thin liquid   Liquid Administration via: Straw Medication Administration: Crushed with puree Supervision: Full supervision/cueing for compensatory strategies Compensations: Minimize environmental distractions;Slow rate;Small sips/bites Postural Changes: Remain upright for at least 30 minutes after po intake;Seated upright at 90 degrees    Other  Recommendations Oral Care Recommendations: Oral care QID Other Recommendations: Have oral suction available   Follow up Recommendations 24 hour supervision/assistance      Frequency and Duration min 1 x/week  1 week       Prognosis Prognosis for Safe Diet Advancement: Guarded Barriers to Reach Goals: Cognitive deficits      Swallow Study   General Date of Onset: 03/13/18 HPI: 84 year old male admitted 03/13/18 with AMS. PMH: dementia, HTN, HLD, GERD, PVD, CVA, falls x2.  Head CT = progressive atrophy. CXR = BLL interstitial edema. Pt leaving for MRI at this time. Congested nonproductive cough noted. Type of Study: Bedside Swallow Evaluation Previous Swallow Assessment: no prior ST intervention found Diet Prior to this Study: NPO Temperature Spikes Noted: No Respiratory Status: Room air History of Recent  Intubation: No Behavior/Cognition: Alert;Cooperative;Pleasant mood;Doesn't follow directions;Distractible;Requires cueing Oral Cavity Assessment: Dried secretions;Dry Oral Care Completed by SLP: Yes Oral Cavity - Dentition: Edentulous Self-Feeding Abilities: Total assist Patient Positioning: Upright in bed Baseline Vocal Quality: Normal Volitional Cough: Cognitively unable to elicit Volitional Swallow: Unable to elicit    Oral/Motor/Sensory Function Overall Oral Motor/Sensory Function: Moderate impairment   Ice Chips Ice chips: Not tested   Thin Liquid Thin Liquid: Within functional limits Presentation: Straw    Nectar Thick Nectar Thick Liquid: Not tested   Honey Thick Honey Thick Liquid: Not tested   Puree Puree: Within functional limits Presentation: Spoon   Solid     Solid: Not tested     Natalie Mceuen B. Quentin Ore, [facility], [place] Speech Language Pathologist [phone]  Shonna Chock 03/14/2018,11:26 AM

## 2018-03-14 NOTE — Clinical Social Work Note (Signed)
Clinical Social Work Assessment  Patient Details  Name: Don Montoya MRN: [id number] Date of Birth: [birth year]  Date of referral:  03/14/18               Reason for consult:  (pt admitted from facility)                Permission sought to share information with:  Facility Art therapist granted to share information::     Name::        Agency::  [facility]  Relationship::     Contact Information:     Housing/Transportation Living arrangements for the past 2 months:  [place] of Information:  Facility Patient Interpreter Needed:  None Criminal Activity/Legal Involvement Pertinent to Current Situation/Hospitalization:  No - Comment as needed Significant Relationships:  Spouse, Adult Children, Warehouse manager Lives with:  Facility Resident Do you feel safe going back to the place where you live?  (UTA) Need for family participation in patient care:  Yes (Comment)(pt with advanced dementia)  Care giving concerns:  Pt admitted from [facility] where he is a long term care resident. Has Alzheimer's and at baseline is not oriented. Uses walker. Has hx of a stroke.  Wife has memory/cognitive impairment and is long term care resident as well. Had a fall when he slipped out of a chair, admitted for work up.  Social Worker assessment / plan:  CSW consulted to assist as pt is admitted from facility- [facility] SN[facility].  Pt in procedure (MRI) at time of assessment. CSW left voicemail for pt's daughter [phone]) requesting call back. Spoke with [facility] where pt is long term care resident to gather brief hx above. Will follow during admission for disposition needs.  Employment status:  Retired Forensic scientist:  Medicare PT Recommendations:    Information / Referral to community resources:     Patient/Family's Response to care:  UTA  Patient/Family's Understanding of and Emotional Response to Diagnosis, Current Treatment, and  Prognosis:  UTA  Emotional Assessment Appearance:  Appears stated age Attitude/Demeanor/Rapport:  (UTA) Affect (typically observed):  (UTA) Orientation:  (typically not oriented per facility) Alcohol / Substance use:  Not Applicable Psych involvement (Current and /or in the community):  No (Comment)  Discharge Needs  Concerns to be addressed:  (assessing) Readmission within the last 30 days:  No Current discharge risk:  (assessing) Barriers to Discharge:  Continued Medical Work up   [employer], LCSW 03/14/2018, 9:43 AM  [phone]

## 2018-03-14 NOTE — Care Management Note (Signed)
Case Management Note  Patient Details  Name: Don Montoya MRN: [id number] Date of Birth: 1915-01-26  Subjective/Objective:Pt admitted with AMS                  Action/Plan: Pt from SNF, CSW to follow.    Expected Discharge Date:  (unknown)               Expected Discharge Plan:  Skilled Nursing Facility  In-House Referral:  Clinical Social Work  Discharge planning Services  CM Consult  Post Acute Care Choice:    Choice offered to:     DME Arranged:    DME Agency:     HH Arranged:    [place] Agency:     Status of Service:  Completed, signed off  If discussed at [employer], dates discussed:    Additional CommentsPurcell Mouton, RN 03/14/2018, 12:40 PM

## 2018-03-14 NOTE — Consult Note (Signed)
Consultation Note Date: 03/14/2018   Patient Name: Don Montoya  DOB: 06/18/1915  MRN: [id number]  Age / Sex: 84 y.o., male  PCP: Daylene Posey, MD Referring Physician: Dessa Phi, DO  Reason for Consultation: Establishing goals of care  HPI/Patient Profile: 84 y.o. male    admitted on 03/13/2018   LELON IKARD is a 84 yo male with past medical history of dementia, hypertension, hyperlipidemia, PVD, CVA who presents after 2 falls and hit his head at his skilled nursing facility.  In the emergency department, CT brain, cervical spine did not reveal any acute intracranial or cervical spine abnormalities.  Patient was admitted for altered mental status.  Clinical Assessment and Goals of Care:  Patient remains admitted to hospital medicine service for altered mental status, confusion, MRI brain with severe atrophy, also diagnosed with sepsis, bibasilar PNA.   A PMT consult has been placed for [place] discussions.   The patient is an elderly gentleman resting in bed, he is awake, but confused, there is no family at bedside. Patient is attempting to take his mittens off. He somewhat settles down when re directed, he is able to state his last name.   He tells me that he is buying a house, he also feels hungry.   Discussed with bedside RN, medications reviewed. See below, thank you for the consult.    NEXT OF KIN  wife reportedly also lives in same nursing facility, has a daughter, call placed, unable to reach daughter Farrel Gordon at [phone].   SUMMARY OF RECOMMENDATIONS    1. Add low dose Zyprexa 2. Continue with Depakote, patient sub therapeutic on Depakote. Also has Celexa, Ativan, trazodone. Continue scope of current hospitalization.  3. Recommend SNF with hospice on discharge.  Thank you for the consult.   Code Status/Advance Care Planning:  DNR    Symptom Management:    as above     Palliative Prophylaxis:   Delirium Protocol    Psycho-social/Spiritual:   Desire for further Chaplaincy support:yes  Additional Recommendations: Education on Hospice  Prognosis:   < 6 months  Discharge Planning: [place] with Hospice      Primary Diagnoses: Present on Admission: . Hyperkalemia . Severe protein-calorie malnutrition ([place]) . Altered mental status   I have reviewed the medical record, interviewed the patient and family, and examined the patient. The following aspects are pertinent.  Past Medical History:  Diagnosis Date  . Alzheimer disease    mild with some degree of paranoia  . Anxiety   . Aortic stenosis   . Arthritis    oa  . Bilateral inguinal hernia   . BPH (benign prostatic hyperplasia)   . BPH (benign prostatic hyperplasia)   . Constipation   . Constipation   . Diabetes mellitus   . Difficulty walking   . Diverticulosis   . Dysphagia   . Esophageal stricture    with dilation  . GERD (gastroesophageal reflux disease)   . Gout   . Hyperlipidemia   .  Hypertension   . Peripheral neuropathy   . PVD (peripheral vascular disease) ([place])   . Renal insufficiency    stage 3 moderate  . Stroke [facility])    Social History   Socioeconomic History  . Marital status: Married    Spouse name: Not on file  . Number of children: 3  . Years of education: Not on file  . Highest education level: Not on file  Occupational History  . Occupation: Retired  Scientific laboratory technician  . Financial resource strain: Not on file  . Food insecurity:    Worry: Not on file    Inability: Not on file  . Transportation needs:    Medical: Not on file    Non-medical: Not on file  Tobacco Use  . Smoking status: Never Smoker  . Smokeless tobacco: Never Used  Substance and Sexual Activity  . Alcohol use: No  . Drug use: No  . Sexual activity: Not Currently  Lifestyle  . Physical activity:    Days per week: Not on file    Minutes per session: Not on  file  . Stress: Not on file  Relationships  . Social connections:    Talks on phone: Not on file    Gets together: Not on file    Attends religious service: Not on file    Active member of club or organization: Not on file    Attends meetings of clubs or organizations: Not on file    Relationship status: Not on file  Other Topics Concern  . Not on file  Social History Narrative   Married    Independent         Family History  Problem Relation Age of Onset  . Diabetes Mother   . Heart disease Mother   . Colon cancer Neg Hx    Scheduled Meds: . bisoprolol  2.5 mg Oral Daily  . citalopram  10 mg Oral Daily  . divalproex  250 mg Oral BID AC  . divalproex  500 mg Oral QHS  . doxazosin  1 mg Oral Daily  . enoxaparin (LOVENOX) injection  30 mg Subcutaneous QHS  . famotidine  10 mg Oral QPM  . feeding supplement (PRO-STAT SUGAR FREE 64)  30 mL Oral BID  . LORazepam  0.5 mg Oral TID  . multivitamin  1 tablet Oral BID  . OLANZapine zydis  2.5 mg Oral Once  . polyethylene glycol  17 g Oral QODAY  . polyvinyl alcohol  2 drop Both Eyes TID  . senna-docusate  2 tablet Oral QHS  . sodium chloride flush  3 mL Intravenous Q12H  . traZODone  25 mg Oral QHS   Continuous Infusions: . sodium chloride    . azithromycin Stopped (03/13/18 2159)  . cefTRIAXone (ROCEPHIN)  IV Stopped (03/13/18 2157)   PRN Meds:.sodium chloride, acetaminophen **OR** acetaminophen, sodium chloride flush Medications Prior to Admission:  Prior to Admission medications   Medication Sig Start Date End Date Taking? Authorizing Provider  acetaminophen (TYLENOL) 500 MG tablet Take 500 mg by mouth 2 (two) times daily.   Yes [provider]  albuterol (PROVENTIL) (2.5 MG/3ML) 0.083% nebulizer solution Take 2.5 mg by nebulization 4 (four) times daily.   Yes [provider]  bisoprolol (ZEBETA) 5 MG tablet Take 2.5 mg by mouth daily.   Yes [provider]  carboxymethylcellulose (REFRESH  TEARS) 0.5 % SOLN 2 drops 3 (three) times daily.   Yes [provider]  citalopram (CELEXA) 10 MG/5ML suspension  Take 10 mg by mouth daily.   Yes [provider]  divalproex (DEPAKOTE SPRINKLE) 125 MG capsule Take 250 mg by mouth 2 (two) times daily.    Yes [provider]  divalproex (DEPAKOTE SPRINKLE) 125 MG capsule Take 500 mg by mouth at bedtime.   Yes [provider]  doxazosin (CARDURA) 1 MG tablet Take 1 mg by mouth daily.   Yes [provider]  famotidine (PEPCID) 10 MG tablet Take 10 mg by mouth every evening.   Yes [provider]  guaiFENesin (ROBITUSSIN) 100 MG/5ML liquid Take 200 mg by mouth every 6 (six) hours as needed for cough.   Yes [provider]  LORazepam (ATIVAN) 0.5 MG tablet Take 0.5 mg by mouth 3 (three) times daily.    Yes [provider]  Multiple Vitamins-Minerals (ICAPS MV PO) Take 1 tablet by mouth 2 (two) times daily. Reported on 07/14/2015   Yes [provider]  NON FORMULARY Take 4 oz by mouth 3 (three) times daily. Med Pass 2.0 (4 oz)   Yes [provider]  polyethylene glycol powder (GLYCOLAX/MIRALAX) powder Take 255 g by mouth daily. Patient taking differently: Take 17 g by mouth See admin instructions. Take 17 g by mouth every two days. Hold Miralax for loose stool diarrhea 09/15/15  Yes Hvozdovic, Lori P, PA-C  sennosides-docusate sodium (SENOKOT-S) 8.6-50 MG tablet Take 2 tablets by mouth at bedtime.   Yes [provider]  traZODone (DESYREL) 50 MG tablet Take 25 mg by mouth at bedtime.   Yes [provider]  esomeprazole (NEXIUM) 40 MG packet Take 40 mg by mouth daily before breakfast. Patient not taking: Reported on 03/13/2018 09/15/15   Hvozdovic, Lori P, PA-C  famotidine (PEPCID AC) 10 MG chewable tablet Chew 1 tablet (10 mg total) by mouth 2 (two) times daily. Patient not taking: Reported on 03/13/2018 07/06/13   Ricard Dillon, MD  Tamsulosin HCl  (FLOMAX) 0.4 MG CAPS Take 2 capsules (0.8 mg total) by mouth daily. Patient not taking: Reported on 03/13/2018 07/25/12   Ricard Dillon, MD   Allergies  Allergen Reactions  . Prednisone Other (See Comments)    psycosis   Review of Systems +confusion  Physical Exam General exam: Appears agitated, pulling off mittens, blankets  Respiratory system: Coarse breath sounds  Cardiovascular system: S1 & S2 heard, RRR.   No pedal edema. Gastrointestinal system: Abdomen is nondistended, soft and nontender. No organomegaly or masses felt. Normal bowel sounds heard. Central nervous system: Alert, does not follow commands  Extremities: Symmetric 5 x 5 power. Skin: No rashes, lesions or ulcers Psychiatry: +Dementia is able to state his last name  Vital Signs: BP 114/69 (BP Location: Right Arm)   Pulse 84   Temp 97.6 F (36.4 C) (Oral)   Resp 20   Ht 5\' 6"  (1.676 m)   Wt 58.6 kg   SpO2 99%   BMI 20.85 kg/m  Pain Scale: PAINAD       SpO2: SpO2: 99 % O2 Device:SpO2: 99 % O2 Flow Rate: .O2 Flow Rate (L/min): 2 L/min  IO: Intake/output summary:   Intake/Output Summary (Last 24 hours) at 03/14/2018 1318 Last data filed at 03/14/2018 0600 Gross per 24 hour  Intake 2247.44 ml  Output 120 ml  Net 2127.44 ml    LBM:   Baseline Weight: Weight: 61.7 kg Most recent weight: Weight: 58.6 kg     Palliative Assessment/Data:   PPS 30%  Time In:  1200 Time  Out:  1300 Time Total:  60 Greater than 50%  of this time was spent counseling and coordinating care related to the above assessment and plan.  Signed by: Loistine Chance, MD  [phone]  Please contact Palliative Medicine Team phone at [phone] for questions and concerns.  For individual provider: See Shea Evans

## 2018-03-14 NOTE — Progress Notes (Signed)
PROGRESS NOTE    Don Montoya  XBD:[id number] DOB: 12-05-1914 DOA: 03/13/2018 PCP: Daylene Posey, MD     Brief Narrative:  Don Montoya is a 84 yo male with past medical history of dementia, hypertension, hyperlipidemia, PVD, CVA who presents after 2 falls and hit his head at his skilled nursing facility.  In the emergency department, CT brain, cervical spine did not reveal any acute intracranial or cervical spine abnormalities.  Patient was admitted for altered mental status.  New events last 24 hours / Subjective: No acute events overnight.  Patient remains altered, unclear baseline status.  He is actively removing his safety mittens, pulling at his blankets, does not answer questions appropriately.  Mumbles incoherently.  Assessment & Plan:   Principal Problem:   Altered mental status Active Problems:   Diabetes mellitus without complication (HCC)   Hyperkalemia   Renal insufficiency   Severe protein-calorie malnutrition (HCC)   Acute metabolic encephalopathy on dementia -CT head: Progressive atrophy and small vessel chronic ischemic changes of deep cerebral white matter. No acute intracranial abnormalities. -MRI brain: Motion degraded study without acute finding. Severe brain atrophy and advanced chronic small vessel disease -Could be worsened in setting of infection as below   Sepsis secondary to bibasilar pneumonia -Blood cultures pending -Rocephin, zithromax   HTN -Continue bisoprolol   Pulmonary nodular opacities, adenopathy -Revealed on CT chest -Could evaluate further with PET, but with patient's dementia and advanced age, would likely not change management   CKD stage 3 -Baseline Cr 1.3-1.6 -Stable  Elevated troponin -0.03 --> 0.06 -Echocardiogram pending   Dysphagia -SLP evaluated patient, currently on dysphagia 1 diet  Mood disorder -Continue celexa, depakote, ativan, trazodone    DVT prophylaxis: Lovenox  Code Status: DNR Family  Communication: No family at bedside Disposition Plan: Pending improvement   Consultants:   None  Procedures:   None   Antimicrobials:  Anti-infectives (From admission, onward)   Start     Dose/Rate Route Frequency Ordered Stop   03/13/18 2015  cefTRIAXone (ROCEPHIN) 2 g in sodium chloride 0.9 % 100 mL IVPB     2 g 200 mL/hr over 30 Minutes Intravenous Every 24 hours 03/13/18 2000     03/13/18 2015  azithromycin (ZITHROMAX) 500 mg in sodium chloride 0.9 % 250 mL IVPB     500 mg 250 mL/hr over 60 Minutes Intravenous Every 24 hours 03/13/18 2000          Objective: Vitals:   03/13/18 2157 03/13/18 2159 03/14/18 0500 03/14/18 0650  BP:  (!) 95/56  114/69  Pulse:  76  84  Resp:  (!) 22  20  Temp:  (!) 97.5 F (36.4 C)  97.6 F (36.4 C)  TempSrc:  Oral  Oral  SpO2:  99%  99%  Weight: 59.7 kg  58.6 kg   Height: 5\' 6"  (1.676 m)       Intake/Output Summary (Last 24 hours) at 03/14/2018 1208 Last data filed at 03/14/2018 0600 Gross per 24 hour  Intake 2247.44 ml  Output 120 ml  Net 2127.44 ml   Filed Weights   03/13/18 1905 03/13/18 2157 03/14/18 0500  Weight: 61.7 kg 59.7 kg 58.6 kg    Examination:  General exam: Appears agitated, pulling off mittens, blankets  Respiratory system: Coarse breath sounds  Cardiovascular system: S1 & S2 heard, RRR. No JVD, murmurs, rubs, gallops or clicks. No pedal edema. Gastrointestinal system: Abdomen is nondistended, soft and nontender. No organomegaly or masses felt. Normal bowel  sounds heard. Central nervous system: Alert, does not follow commands  Extremities: Symmetric 5 x 5 power. Skin: No rashes, lesions or ulcers Psychiatry: +Dementia   Data Reviewed: I have personally reviewed following labs and imaging studies  CBC: Recent Labs  Lab 03/13/18 1853 03/14/18 0417  WBC 17.6* 16.1*  NEUTROABS 14.8*  --   HGB 10.3* 9.3*  HCT 31.8* 29.0*  MCV 96.7 98.3  PLT 183 270*   Basic Metabolic Panel: Recent Labs  Lab  03/13/18 1853 03/14/18 0417  NA 140 147*  K 5.3* 5.1  CL 104 114*  CO2 22 24  GLUCOSE 162* 157*  BUN 29* 31*  CREATININE 1.63* 1.40*  CALCIUM 8.6* 7.9*   GFR: Estimated Creatinine Clearance: 23.3 mL/min (A) (by C-G formula based on SCr of 1.4 mg/dL (H)). Liver Function Tests: Recent Labs  Lab 03/13/18 1853 03/14/18 0417  AST 31 17  ALT 8 9  ALKPHOS 39 34*  BILITOT 1.4* 0.4  PROT 5.8* 5.4*  ALBUMIN 2.8* 2.4*   No results for input(s): LIPASE, AMYLASE in the last 168 hours. No results for input(s): AMMONIA in the last 168 hours. Coagulation Profile: No results for input(s): INR, PROTIME in the last 168 hours. Cardiac Enzymes: Recent Labs  Lab 03/13/18 1853 03/13/18 2236 03/14/18 0417 03/14/18 1051  TROPONINI 0.03* 0.03* 0.03* 0.06*   BNP (last 3 results) No results for input(s): PROBNP in the last 8760 hours. HbA1C: No results for input(s): HGBA1C in the last 72 hours. CBG: Recent Labs  Lab 03/13/18 2023  GLUCAP 81   Lipid Profile: No results for input(s): CHOL, HDL, LDLCALC, TRIG, CHOLHDL, LDLDIRECT in the last 72 hours. Thyroid Function Tests: Recent Labs    03/13/18 2236  TSH 1.546   Anemia Panel: Recent Labs    03/13/18 2236  VITAMINB12 700   Sepsis Labs: Recent Labs  Lab 03/13/18 1901 03/13/18 2029  LATICACIDVEN 3.17* <0.30*    Recent Results (from the past 240 hour(s))  MRSA PCR Screening     Status: None   Collection Time: 03/13/18 12:32 AM  Result Value Ref Range Status   MRSA by PCR NEGATIVE NEGATIVE Final    Comment:        The GeneXpert MRSA Assay (FDA approved for NASAL specimens only), is one component of a comprehensive MRSA colonization surveillance program. It is not intended to diagnose MRSA infection nor to guide or monitor treatment for MRSA infections. Performed at [facility], [place] [address] [place], [place] [address]        Radiology Studies: Dg Chest 2 View  Result Date:  03/13/2018 CLINICAL DATA:  Increasing falls.  Found down today. EXAM: CHEST - 2 VIEW COMPARISON:  Chest x-ray dated 09/01/2015. FINDINGS: Heart size and mediastinal contours are stable. Calcified pleural plaques again noted bilaterally. Probable mild bilateral interstitial edema, best seen on the lateral projection. No confluent opacity to suggest a developing pneumonia. No pleural effusion or pneumothorax seen. No acute or suspicious osseous finding. IMPRESSION: 1. Probable mild bibasilar interstitial edema. 2. Calcified pleural plaques again noted bilaterally, compatible with prior asbestos exposure. 3. No osseous fracture or dislocation seen. Electronically Signed   By: Franki Cabot M.D.   On: 03/13/2018 19:21   Ct Head Wo Contrast  Result Date: 03/13/2018 CLINICAL DATA:  Increasing falls, posttraumatic headache, fell twice this afternoon, found unconscious after being alone for 10 minutes, abrasion and bruising to forehead, history Alzheimer's, diabetes mellitus, hypertension EXAM: CT HEAD WITHOUT CONTRAST CT CERVICAL  SPINE WITHOUT CONTRAST TECHNIQUE: Multidetector CT imaging of the head and cervical spine was performed following the standard protocol without intravenous contrast. Multiplanar CT image reconstructions of the cervical spine were also generated. COMPARISON:  12/01/2007 CT head FINDINGS: CT HEAD FINDINGS Brain: Generalized atrophy increased since previous exam. Normal ventricular morphology. No midline shift or mass effect. Small vessel chronic ischemic changes of deep cerebral white matter progressive since 2009. No intracranial hemorrhage, mass lesion, evidence of acute infarction, or extra-axial fluid collection. Vascular: Atherosclerotic calcifications of internal carotid arteries bilaterally at skull base. Stent identified at LEFT vertebral artery. Skull: Intact Sinuses/Orbits: Clear Other: N/A CT CERVICAL SPINE FINDINGS Alignment: Minimal anterolisthesis at C3-C4 and C7-T1. Minimal  retrolisthesis C4-C5. Skull base and vertebrae: Osseous mineralization appears mildly decreased. Skull base intact. Multilevel disc space narrowing and endplate spur formation. Multilevel facet degenerative changes. Vertebral body heights maintained without fracture or bone destruction. Encroachment upon cervical neural foramina bilaterally at multiple levels by uncovertebral spurs. Soft tissues and spinal canal: Prevertebral soft tissues normal thickness. Atherosclerotic calcifications of the carotid bifurcations bilaterally. Tiny nonspecific RIGHT thyroid nodule. Cervical soft tissues otherwise unremarkable. Disc levels:  No gross disc herniation Upper chest: Lung apices clear Other: N/A IMPRESSION: Progressive atrophy and small vessel chronic ischemic changes of deep cerebral white matter. No acute intracranial abnormalities. Multilevel degenerative disc and facet disease changes of the cervical spine. No acute cervical spine abnormalities. Electronically Signed   By: Lavonia Dana M.D.   On: 03/13/2018 19:46   Ct Chest Wo Contrast  Result Date: 03/14/2018 CLINICAL DATA:  Shortness of breath and cough EXAM: CT CHEST WITHOUT CONTRAST TECHNIQUE: Multidetector CT imaging of the chest was performed following the standard protocol without IV contrast. COMPARISON:  Chest radiograph March 13, 2018 FINDINGS: Cardiovascular: There is no thoracic aortic aneurysm. Visualized great vessels appear unremarkable except for calcification at the origin of the left common carotid artery. There are foci of aortic atherosclerosis. There are foci of coronary artery calcification. No pericardial effusion or pericardial thickening evident. Mediastinum/Nodes: Thyroid appears unremarkable. There are multiple subcentimeter mediastinal lymph nodes. There is an aortopulmonary window lymph node measuring 1.9 x 1.2 cm. There is a lymph node to the left of the distal trachea measuring 1.9 x 1.3 cm. There is a lymph node anterior to the  carina toward the right measuring 1.2 x 1.2 cm. There are confluent subcarinal lymph nodes with the confluence measuring 3.2 x 1.6 cm. No esophageal lesions are appreciable. Lungs/Pleura: There are small pleural effusions bilaterally. There is extensive parietal pleural calcification consistent with previous asbestos exposure. There is patchy consolidation in both lower lobes, likely representing a combination of pneumonia and atelectasis. There is an irregular opacity in the anterior segment of the left upper lobe measuring 2.5 x 2.0 cm, noncalcified. There are scattered subcentimeter nodular type opacities throughout the left upper lobe and lingula ranging in size from as small as 5 mm to as large as 1 cm. There is a nodular opacity near the pleura in the left upper lobe measuring 1.0 x 0.8 cm, seen on axial slice 83 series 8. Note that there is underlying interstitial thickening, likely due to the chronic asbestosis/asbestos exposure. Upper Abdomen: In the visualized upper abdomen, there is aortic atherosclerosis. No evident adrenal lesions. There are renal cysts on the left, largest measuring 5.9 x 5.5 cm. There is a right renal cyst measuring 2.2 x 2.2 cm. Musculoskeletal: There is degenerative change throughout the thoracic spine. There are no evident blastic or  lytic bone lesions. IMPRESSION: 1. Evidence of previous asbestos exposure with multiple areas of pleural calcification. Areas of interstitial prominence likely are due to asbestosis. 2. Patchy nodular opacities are noted primarily in the left upper lobe. There is an area of consolidation in the anterior segment left upper lobe measuring 2.5 x 2.0 cm. This area may represent localized rounded atelectasis due to the underlying asbestos related changes. A neoplasm with surrounding pneumonitis in this area is a differential consideration. The nodular opacities in the left upper lobe potentially could represent small neoplastic lesions as well. Given the  concern for possible neoplasm and the risk of developing neoplasm in a patient with asbestos related changes, correlation with nuclear medicine PET study may be advised for further evaluation. 3. Fairly small pleural effusions bilaterally with bibasilar consolidation, likely due to a combination of pneumonia and compressive atelectasis. 4. Adenopathy at several areas, heightening potential for underlying neoplasm. These lymph nodes also could have reactive etiology given the degree of changes in the lung parenchyma. PET-CT to further evaluate these lymph nodes is felt to be advisable. 5. Aortic atherosclerosis. Foci of great vessel and coronary artery calcification. Aortic Atherosclerosis (ICD10-I70.0). Electronically Signed   By: Lowella Grip III M.D.   On: 03/14/2018 09:04   Ct Cervical Spine Wo Contrast  Result Date: 03/13/2018 CLINICAL DATA:  Increasing falls, posttraumatic headache, fell twice this afternoon, found unconscious after being alone for 10 minutes, abrasion and bruising to forehead, history Alzheimer's, diabetes mellitus, hypertension EXAM: CT HEAD WITHOUT CONTRAST CT CERVICAL SPINE WITHOUT CONTRAST TECHNIQUE: Multidetector CT imaging of the head and cervical spine was performed following the standard protocol without intravenous contrast. Multiplanar CT image reconstructions of the cervical spine were also generated. COMPARISON:  12/01/2007 CT head FINDINGS: CT HEAD FINDINGS Brain: Generalized atrophy increased since previous exam. Normal ventricular morphology. No midline shift or mass effect. Small vessel chronic ischemic changes of deep cerebral white matter progressive since 2009. No intracranial hemorrhage, mass lesion, evidence of acute infarction, or extra-axial fluid collection. Vascular: Atherosclerotic calcifications of internal carotid arteries bilaterally at skull base. Stent identified at LEFT vertebral artery. Skull: Intact Sinuses/Orbits: Clear Other: N/A CT CERVICAL SPINE  FINDINGS Alignment: Minimal anterolisthesis at C3-C4 and C7-T1. Minimal retrolisthesis C4-C5. Skull base and vertebrae: Osseous mineralization appears mildly decreased. Skull base intact. Multilevel disc space narrowing and endplate spur formation. Multilevel facet degenerative changes. Vertebral body heights maintained without fracture or bone destruction. Encroachment upon cervical neural foramina bilaterally at multiple levels by uncovertebral spurs. Soft tissues and spinal canal: Prevertebral soft tissues normal thickness. Atherosclerotic calcifications of the carotid bifurcations bilaterally. Tiny nonspecific RIGHT thyroid nodule. Cervical soft tissues otherwise unremarkable. Disc levels:  No gross disc herniation Upper chest: Lung apices clear Other: N/A IMPRESSION: Progressive atrophy and small vessel chronic ischemic changes of deep cerebral white matter. No acute intracranial abnormalities. Multilevel degenerative disc and facet disease changes of the cervical spine. No acute cervical spine abnormalities. Electronically Signed   By: Lavonia Dana M.D.   On: 03/13/2018 19:46   Mr Brain Wo Contrast  Result Date: 03/14/2018 CLINICAL DATA:  Unexplained altered level of consciousness EXAM: MRI HEAD WITHOUT CONTRAST TECHNIQUE: Multiplanar, multiecho pulse sequences of the brain and surrounding structures were obtained without intravenous contrast. COMPARISON:  03/13/2018 FINDINGS: Brain: No acute infarction, hemorrhage, hydrocephalus, extra-axial collection or mass lesion. Atrophy that is severe and diffuse. Marked medial temporal involvement in this patient with history of Alzheimer's disease. Confluent chronic small vessel ischemic gliosis in the periventricular  white matter. Small remote cerebellar infarcts. Vascular: Major flow voids are preserved Skull and upper cervical spine: No evidence of marrow lesion Sinuses/Orbits: Bilateral cataract resection.  No acute finding IMPRESSION: 1. Motion degraded study  without acute finding. 2. Severe brain atrophy and advanced chronic small vessel disease. Electronically Signed   By: Monte Fantasia M.D.   On: 03/14/2018 10:27      Scheduled Meds: . bisoprolol  2.5 mg Oral Daily  . citalopram  10 mg Oral Daily  . divalproex  250 mg Oral BID AC  . divalproex  500 mg Oral QHS  . doxazosin  1 mg Oral Daily  . enoxaparin (LOVENOX) injection  30 mg Subcutaneous QHS  . famotidine  10 mg Oral QPM  . feeding supplement (PRO-STAT SUGAR FREE 64)  30 mL Oral BID  . LORazepam  0.5 mg Oral TID  . multivitamin  1 tablet Oral BID  . polyethylene glycol  17 g Oral QODAY  . polyvinyl alcohol  2 drop Both Eyes TID  . senna-docusate  2 tablet Oral QHS  . sodium chloride flush  3 mL Intravenous Q12H  . traZODone  25 mg Oral QHS   Continuous Infusions: . sodium chloride    . azithromycin Stopped (03/13/18 2159)  . cefTRIAXone (ROCEPHIN)  IV Stopped (03/13/18 2157)     LOS: 1 day    Time spent: 40 minutes   Dessa Phi, DO [facility] [email or web address] Password [email or web address] 03/14/2018, 12:08 PM

## 2018-03-14 NOTE — Progress Notes (Signed)
SLP Cancellation Note  Patient Details Name: Don Montoya MRN: [id number] DOB: 1914/07/28   Cancelled treatment:       Reason Eval/Treat Not Completed: Patient at procedure or test/unavailablePt leaving for MRI at this time. Pt was noted to have a congested nonproductive cough, and has a history of GERD. HOB raised to 35 degrees to mitigate reflux aspiration risk. RN informed. SLP will continue efforts.  Miguelangel Korn B. Quentin Ore [place], CCC-SLP Speech Language Pathologist [phone]  Shonna Chock 03/14/2018, 9:17 AM

## 2018-03-15 DIAGNOSIS — J189 Pneumonia, unspecified organism: Secondary | ICD-10-CM

## 2018-03-15 LAB — BASIC METABOLIC PANEL
Anion gap: 7 (ref 5–15)
BUN: 30 mg/dL — AB (ref 8–23)
CALCIUM: 8.3 mg/dL — AB (ref 8.9–10.3)
CO2: 25 mmol/L (ref 22–32)
CREATININE: 1.12 mg/dL (ref 0.61–1.24)
Chloride: 115 mmol/L — ABNORMAL HIGH (ref 98–111)
GFR calc Af Amer: >60 mL/min (ref >60)
GFR, EST NON AFRICAN AMERICAN: 52 mL/min — AB (ref >60)
Glucose, Bld: 136 mg/dL — ABNORMAL HIGH (ref 70–99)
Potassium: 3.8 mmol/L (ref 3.5–5.1)
SODIUM: 147 mmol/L — AB (ref 135–145)

## 2018-03-15 LAB — CBC
HCT: 29.2 % — ABNORMAL LOW (ref 39.0–52.0)
Hemoglobin: 9.3 g/dL — ABNORMAL LOW (ref 13.0–17.0)
MCH: 31 pg (ref 26.0–34.0)
MCHC: 31.8 g/dL (ref 30.0–36.0)
MCV: 97.3 fL (ref 78.0–100.0)
PLATELETS: 151 10*3/uL (ref 150–400)
RBC: 3 MIL/uL — AB (ref 4.22–5.81)
RDW: 14.1 % (ref 11.5–15.5)
WBC: 15 10*3/uL — ABNORMAL HIGH (ref 4.0–10.5)

## 2018-03-15 MED ORDER — DIVALPROEX SODIUM 125 MG PO CSDR: 250.0000 mg | DELAYED_RELEASE_CAPSULE | Freq: Two times a day (BID) | ORAL | Status: AC

## 2018-03-15 MED ORDER — AMOXICILLIN-POT CLAVULANATE 875-125 MG PO TABS: 1.0000 | ORAL_TABLET | Freq: Two times a day (BID) | ORAL | 0 refills | Status: AC

## 2018-03-15 MED ORDER — AZITHROMYCIN 500 MG PO TABS: 500.0000 mg | ORAL_TABLET | Freq: Every day | ORAL | 0 refills | Status: AC

## 2018-03-15 NOTE — Progress Notes (Addendum)
Pt returning to [facility] - report [phone]. Will arrange PTAR transportation. DC information provided via the [place]. Left voicemail for DSS guardian contact [phone].  Sharren Bridge, MSW, LCSW Clinical Social Work 03/15/2018 [phone]

## 2018-03-15 NOTE — NC FL2 (Signed)
[place] LEVEL OF CARE SCREENING TOOL     IDENTIFICATION  Patient Name: Don Montoya Birthdate: [birth year] Sex: male Admission Date (Current Location): 03/13/2018  [facility] and [place] Number:  Herbalist and Address:  [facility],  [place], [place]      Provider Number: [id number]  Attending Physician Name and Address:  Patrecia Pour, Christean Grief, MD  Relative Name and Phone Number:       Current Level of Care: Hospital Recommended Level of Care: [place] Prior Approval Number:    Date Approved/Denied:   PASRR Number: [id number]  Discharge Plan: SNF    Current Diagnoses: Patient Active Problem List   Diagnosis Date Noted  . Community acquired pneumonia   . Encounter for palliative care   . Goals of care, counseling/discussion   . Hyperkalemia 03/13/2018  . Renal insufficiency 03/13/2018  . Severe protein-calorie malnutrition ([place]) 03/13/2018  . Fever 03/13/2018  . Altered mental status 03/13/2018  . Stricture and stenosis of esophagus 01/08/2013  . Impacted esophageal foreign body 01/08/2013  . Bilateral inguinal hernia-right recurrent 11/30/2011  . Constipation, acute 05/08/2011  . Hip pain 03/30/2011  . BPH (benign prostatic hypertrophy) with urinary retention 01/29/2011  . GOUT, UNSPECIFIED 07/28/2010  . OCCLUSION&STENOSIS BASILAR ARTERY W/INFARCT 06/08/2010  . GERD 11/21/2008  . PERSONAL HX COLONIC POLYPS 11/21/2008  . MACULAR DEGENERATION OF RETINA UNSPECIFIED 06/07/2008  . ELEVATED PROSTATE SPECIFIC ANTIGEN 06/07/2008  . HYPERLIPIDEMIA 06/09/2007  . Diabetes mellitus without complication ([place]) 96/29/5284  . HYPERTENSION 02/07/2007  . PERIPHERAL VASCULAR DISEASE 02/07/2007  . Diverticulosis of colon (without mention of hemorrhage) 02/07/2007  . RENAL INSUFFICIENCY 02/07/2007    Orientation RESPIRATION BLADDER Height & Weight     Self  O2(2L) Incontinent Weight: 129 lb 3 oz (58.6  kg) Height:  5\' 6"  (167.6 cm)  BEHAVIORAL SYMPTOMS/MOOD NEUROLOGICAL BOWEL NUTRITION STATUS      Continent Diet(Low sodium heart healthy diet)  AMBULATORY STATUS COMMUNICATION OF NEEDS Skin   Limited Assist Verbally Normal                       Personal Care Assistance Level of Assistance  Bathing, Feeding, Dressing Bathing Assistance: Limited assistance Feeding assistance: Independent Dressing Assistance: Limited assistance     Functional Limitations Info  Sight, Hearing, Speech Sight Info: Adequate Hearing Info: Adequate Speech Info: Adequate    SPECIAL CARE FACTORS FREQUENCY                       Contractures Contractures Info: Not present    Additional Factors Info  Code Status, Allergies Code Status Info: DNR Allergies Info: prednisone           Current Medications (03/15/2018):  This is the current hospital active medication list Current Facility-Administered Medications  Medication Dose Route Frequency Provider Last Rate Last Dose  . 0.9 %  sodium chloride infusion  250 mL Intravenous PRN Jani Gravel, MD 10 mL/hr at 03/15/18 0600    . acetaminophen (TYLENOL) tablet 650 mg  650 mg Oral Q6H PRN Jani Gravel, MD       Or  . acetaminophen (TYLENOL) suppository 650 mg  650 mg Rectal Q6H PRN Jani Gravel, MD      . azithromycin (ZITHROMAX) 500 mg in sodium chloride 0.9 % 250 mL IVPB  500 mg Intravenous Q24H Lacretia Leigh, MD   Stopped at 03/15/18 0115  . bisoprolol (ZEBETA) tablet 2.5  mg  2.5 mg Oral Daily Jani Gravel, MD   2.5 mg at 03/15/18 0947  . cefTRIAXone (ROCEPHIN) 2 g in sodium chloride 0.9 % 100 mL IVPB  2 g Intravenous Q24H Lacretia Leigh, MD   Stopped at 03/14/18 2352  . citalopram (CELEXA) tablet 10 mg  10 mg Oral Daily Jani Gravel, MD   10 mg at 03/15/18 0946  . divalproex (DEPAKOTE SPRINKLE) capsule 250 mg  250 mg Oral BID Carmelia Roller, MD   250 mg at 03/15/18 0946  . divalproex (DEPAKOTE SPRINKLE) capsule 500 mg  500 mg Oral QHS Jani Gravel, MD    500 mg at 03/14/18 2252  . doxazosin (CARDURA) tablet 1 mg  1 mg Oral Daily Jani Gravel, MD   1 mg at 03/15/18 0947  . enoxaparin (LOVENOX) injection 30 mg  30 mg Subcutaneous QHS Jani Gravel, MD   30 mg at 03/14/18 2247  . famotidine (PEPCID) tablet 10 mg  10 mg Oral QPM Jani Gravel, MD   10 mg at 03/14/18 1729  . feeding supplement (PRO-STAT SUGAR FREE 64) liquid 30 mL  30 mL Oral BID Jani Gravel, MD   30 mL at 03/15/18 0946  . LORazepam (ATIVAN) tablet 0.5 mg  0.5 mg Oral TID Jani Gravel, MD   0.5 mg at 03/15/18 0946  . MEDLINE mouth rinse  15 mL Mouth Rinse BID Dessa Phi, DO      . multivitamin (PROSIGHT) tablet 1 tablet  1 tablet Oral BID Jani Gravel, MD   1 tablet at 03/15/18 0946  . polyethylene glycol (MIRALAX / GLYCOLAX) packet 17 g  17 g Oral Oretha Milch, MD      . polyvinyl alcohol (LIQUIFILM TEARS) 1.4 % ophthalmic solution 2 drop  2 drop Both Eyes TID Jani Gravel, MD   2 drop at 03/15/18 0947  . senna-docusate (Senokot-S) tablet 2 tablet  2 tablet Oral Loma Sousa, MD   2 tablet at 03/14/18 2248  . sodium chloride flush (NS) 0.9 % injection 3 mL  3 mL Intravenous Q12H Jani Gravel, MD   3 mL at 03/14/18 2325  . sodium chloride flush (NS) 0.9 % injection 3 mL  3 mL Intravenous PRN Jani Gravel, MD      . traZODone (DESYREL) tablet 25 mg  25 mg Oral Loma Sousa, MD   25 mg at 03/14/18 2249     Discharge Medications: Please see discharge summary for a list of discharge medications.  Relevant Imaging Results:  Relevant Lab Results:   Additional Information SS# [id number]  Nila Nephew, LCSW

## 2018-03-15 NOTE — Discharge Summary (Signed)
Physician Discharge Summary  [name]  NOM:[id number]  DOB: [birth year]  DOA: 03/13/2018 PCP: Daylene Posey, MD  Admit date: 03/13/2018 Discharge date: 03/15/2018  Admitted From: SNF  Disposition: SNF   Recommendations for Outpatient Follow-up:  1. Follow up with SNF provider at earliest convenience 2. Please obtain BMP/CBC in one week to monitor renal function and hemoglobin 3. Please follow up on the following pending results: Final blood cultures no growth so far 4. Hospice consult at SNF 5. Complete antibiotic therapy as indicated   Discharge Condition: Stable CODE STATUS: DNR Diet recommendation: Dysphagia 1    Brief/Interim Summary: For full details see H&P/Progress note, but in brief, Don Montoya is a 84 year old male with past medical history of dementia, hypertension, PVD, CVA presented to the emergency department after [place] was x2 and hitting his head a skilled nursing facility.  Upon ED evaluation CT brain, cervical spine did not reveal any acute intracranial or cervical spine abnormality.  MRI of the brain was not remarkable.  Patient was admitted for altered mental status.  CT of the chest reveal bibasilar pneumonia patient was treated for sepsis secondary to pulmonary infection.  Patient was treated with empiric antibiotic Rocephin and azithromycin, with clinical improvement.  Patient back to mental baseline and was deemed stable for discharge to follow-up with primary care physician as an outpatient.  Subjective: Patient seen and examined, he is pleasantly confused, has no complaints this morning. Remains afebrile. No acute events overnight. Per nursing staff still coughing.   Discharge Diagnoses/Hospital Course:  Principal Problem:   Altered mental status Active Problems:   Diabetes mellitus without complication (HCC)   Hyperkalemia   Renal insufficiency   Severe protein-calorie malnutrition ([place])   Community acquired pneumonia   Encounter for palliative  care   Goals of care, counseling/discussion  Acute metabolic encephalopathy on dementia -CT head: Progressive atrophy and small vessel chronic ischemic changes of deep cerebral white matter. No acute intracranial abnormalities. -MRI brain: Motion degraded study without acute finding. Severe brain atrophy and advanced chronic small vessel disease -Felt that this was worsening in setting of infectious process.  Acute respiratory failure due to Sepsis secondary to bibasilar pneumonia -Blood cultures negative so far, requires O2 2 L [place], unable to wean off -He was treated with IV Rocephin and azithromycin. Will discharge on Augmentin and 1 more day of azithromycin. Continue supportive treatment, sepsis physiology resolved.  Aspiration may be contributed to infectious process as well.  HTN -Continue bisoprolol   Pulmonary nodular opacities, adenopathy -Revealed on CT chest -Could evaluate further with PET, but with patient's dementia and advanced age, would likely not change management   CKD stage 3 -Baseline Cr 1.3-1.6 -Stable  Elevated troponin -0.03 --> 0.06 -Echo shows LVEF of 55 to 60%, no wall motion abnormalities. -Elevated troponin felt to be secondary to demand ischemia from sepsis process.  Dysphagia -SLP evaluated patient, recommended dysphagia 1 diet  Mood disorder -Continue celexa, ativan, trazodone  -Depakote levels were low, dose increased to 250 mg BID and continue 500 mg qhs  -Palliative care was consulted and recommended to add low dose of Zyprexa, however this was not started as depakote was increased, will defer to PCP if needed as OP. Hospice consult recommended as SNF.  All other chronic medical condition were stable during the hospitalization.  On the day of the discharge the patient's vitals were stable, and no other acute medical condition were reported by patient. the patient was felt safe to be discharge  to SNF   Discharge Instructions  You were  cared for by a hospitalist during your hospital stay. If you have any questions about your discharge medications or the care you received while you were in the hospital after you are discharged, you can call the unit and asked to speak with the hospitalist on call if the hospitalist that took care of you is not available. Once you are discharged, your primary care physician will handle any further medical issues. Please note that NO REFILLS for any discharge medications will be authorized once you are discharged, as it is imperative that you return to your primary care physician (or establish a relationship with a primary care physician if you do not have one) for your aftercare needs so that they can reassess your need for medications and monitor your lab values.  Discharge Instructions    Call MD for:  difficulty breathing, headache or visual disturbances   Complete by:  As directed    Call MD for:  extreme fatigue   Complete by:  As directed    Call MD for:  hives   Complete by:  As directed    Call MD for:  persistant dizziness or light-headedness   Complete by:  As directed    Call MD for:  persistant nausea and vomiting   Complete by:  As directed    Call MD for:  redness, tenderness, or signs of infection (pain, swelling, redness, odor or green/yellow discharge around incision site)   Complete by:  As directed    Call MD for:  severe uncontrolled pain   Complete by:  As directed    Call MD for:  temperature >100.4   Complete by:  As directed    Diet - low sodium heart healthy   Complete by:  As directed    Increase activity slowly   Complete by:  As directed      Allergies as of 03/15/2018      Reactions   Prednisone Other (See Comments)   psycosis      Medication List    STOP taking these medications   esomeprazole 40 MG packet Commonly known as:  NEXIUM   tamsulosin 0.4 MG Caps capsule Commonly known as:  FLOMAX     TAKE these medications   acetaminophen 500 MG  tablet Commonly known as:  TYLENOL Take 500 mg by mouth 2 (two) times daily.   albuterol (2.5 MG/3ML) 0.083% nebulizer solution Commonly known as:  PROVENTIL Take 2.5 mg by nebulization 4 (four) times daily.   amoxicillin-clavulanate 875-125 MG tablet Commonly known as:  AUGMENTIN Take 1 tablet by mouth every 12 (twelve) hours for 7 days.   azithromycin 500 MG tablet Commonly known as:  ZITHROMAX Take 1 tablet (500 mg total) by mouth daily for 1 day. Take 1 tablet daily for 3 days. Start taking on:  03/16/2018   bisoprolol 5 MG tablet Commonly known as:  ZEBETA Take 2.5 mg by mouth daily.   citalopram 10 MG/5ML suspension Commonly known as:  CELEXA Take 10 mg by mouth daily.   divalproex 125 MG capsule Commonly known as:  DEPAKOTE SPRINKLE Take 500 mg by mouth at bedtime.   divalproex 125 MG capsule Commonly known as:  DEPAKOTE SPRINKLE Take 2 capsules (250 mg total) by mouth 2 (two) times daily.   doxazosin 1 MG tablet Commonly known as:  CARDURA Take 1 mg by mouth daily.   famotidine 10 MG tablet Commonly known as:  PEPCID Take  10 mg by mouth every evening. What changed:  Another medication with the same name was removed. Continue taking this medication, and follow the directions you see here.   guaiFENesin 100 MG/5ML liquid Commonly known as:  ROBITUSSIN Take 200 mg by mouth every 6 (six) hours as needed for cough.   ICAPS MV PO Take 1 tablet by mouth 2 (two) times daily. Reported on 07/14/2015   LORazepam 0.5 MG tablet Commonly known as:  ATIVAN Take 0.5 mg by mouth 3 (three) times daily.   NON FORMULARY Take 4 oz by mouth 3 (three) times daily. Med Pass 2.0 (4 oz)   polyethylene glycol powder powder Commonly known as:  GLYCOLAX/MIRALAX Take 255 g by mouth daily. What changed:    how much to take  when to take this  additional instructions   REFRESH TEARS 0.5 % Soln Generic drug:  carboxymethylcellulose 2 drops 3 (three) times daily.    sennosides-docusate sodium 8.6-50 MG tablet Commonly known as:  SENOKOT-S Take 2 tablets by mouth at bedtime.   traZODone 50 MG tablet Commonly known as:  DESYREL Take 25 mg by mouth at bedtime.            Durable Medical Equipment  (From admission, onward)         Start     Ordered   Unscheduled  DME Oxygen  Once    Question Answer Comment  Mode or (Route) Nasal cannula   Liters per Minute 2   Frequency Continuous (stationary and portable oxygen unit needed)   Oxygen delivery system Gas      03/15/18 1443          Allergies  Allergen Reactions  . Prednisone Other (See Comments)    psycosis    Consultations:  Palliative Care    Procedures/Studies: Dg Chest 2 View  Result Date: 03/13/2018 CLINICAL DATA:  Increasing falls.  Found down today. EXAM: CHEST - 2 VIEW COMPARISON:  Chest x-ray dated 09/01/2015. FINDINGS: Heart size and mediastinal contours are stable. Calcified pleural plaques again noted bilaterally. Probable mild bilateral interstitial edema, best seen on the lateral projection. No confluent opacity to suggest a developing pneumonia. No pleural effusion or pneumothorax seen. No acute or suspicious osseous finding. IMPRESSION: 1. Probable mild bibasilar interstitial edema. 2. Calcified pleural plaques again noted bilaterally, compatible with prior asbestos exposure. 3. No osseous fracture or dislocation seen. Electronically Signed   By: Franki Cabot M.D.   On: 03/13/2018 19:21   Ct Head Wo Contrast  Result Date: 03/13/2018 CLINICAL DATA:  Increasing falls, posttraumatic headache, fell twice this afternoon, found unconscious after being alone for 10 minutes, abrasion and bruising to forehead, history Alzheimer's, diabetes mellitus, hypertension EXAM: CT HEAD WITHOUT CONTRAST CT CERVICAL SPINE WITHOUT CONTRAST TECHNIQUE: Multidetector CT imaging of the head and cervical spine was performed following the standard protocol without intravenous contrast.  Multiplanar CT image reconstructions of the cervical spine were also generated. COMPARISON:  12/01/2007 CT head FINDINGS: CT HEAD FINDINGS Brain: Generalized atrophy increased since previous exam. Normal ventricular morphology. No midline shift or mass effect. Small vessel chronic ischemic changes of deep cerebral white matter progressive since 2009. No intracranial hemorrhage, mass lesion, evidence of acute infarction, or extra-axial fluid collection. Vascular: Atherosclerotic calcifications of internal carotid arteries bilaterally at skull base. Stent identified at LEFT vertebral artery. Skull: Intact Sinuses/Orbits: Clear Other: N/A CT CERVICAL SPINE FINDINGS Alignment: Minimal anterolisthesis at C3-C4 and C7-T1. Minimal retrolisthesis C4-C5. Skull base and vertebrae: Osseous mineralization appears mildly  decreased. Skull base intact. Multilevel disc space narrowing and endplate spur formation. Multilevel facet degenerative changes. Vertebral body heights maintained without fracture or bone destruction. Encroachment upon cervical neural foramina bilaterally at multiple levels by uncovertebral spurs. Soft tissues and spinal canal: Prevertebral soft tissues normal thickness. Atherosclerotic calcifications of the carotid bifurcations bilaterally. Tiny nonspecific RIGHT thyroid nodule. Cervical soft tissues otherwise unremarkable. Disc levels:  No gross disc herniation Upper chest: Lung apices clear Other: N/A IMPRESSION: Progressive atrophy and small vessel chronic ischemic changes of deep cerebral white matter. No acute intracranial abnormalities. Multilevel degenerative disc and facet disease changes of the cervical spine. No acute cervical spine abnormalities. Electronically Signed   By: Lavonia Dana M.D.   On: 03/13/2018 19:46   Ct Chest Wo Contrast  Result Date: 03/14/2018 CLINICAL DATA:  Shortness of breath and cough EXAM: CT CHEST WITHOUT CONTRAST TECHNIQUE: Multidetector CT imaging of the chest was  performed following the standard protocol without IV contrast. COMPARISON:  Chest radiograph March 13, 2018 FINDINGS: Cardiovascular: There is no thoracic aortic aneurysm. Visualized great vessels appear unremarkable except for calcification at the origin of the left common carotid artery. There are foci of aortic atherosclerosis. There are foci of coronary artery calcification. No pericardial effusion or pericardial thickening evident. Mediastinum/Nodes: Thyroid appears unremarkable. There are multiple subcentimeter mediastinal lymph nodes. There is an aortopulmonary window lymph node measuring 1.9 x 1.2 cm. There is a lymph node to the left of the distal trachea measuring 1.9 x 1.3 cm. There is a lymph node anterior to the carina toward the right measuring 1.2 x 1.2 cm. There are confluent subcarinal lymph nodes with the confluence measuring 3.2 x 1.6 cm. No esophageal lesions are appreciable. Lungs/Pleura: There are small pleural effusions bilaterally. There is extensive parietal pleural calcification consistent with previous asbestos exposure. There is patchy consolidation in both lower lobes, likely representing a combination of pneumonia and atelectasis. There is an irregular opacity in the anterior segment of the left upper lobe measuring 2.5 x 2.0 cm, noncalcified. There are scattered subcentimeter nodular type opacities throughout the left upper lobe and lingula ranging in size from as small as 5 mm to as large as 1 cm. There is a nodular opacity near the pleura in the left upper lobe measuring 1.0 x 0.8 cm, seen on axial slice 83 series 8. Note that there is underlying interstitial thickening, likely due to the chronic asbestosis/asbestos exposure. Upper Abdomen: In the visualized upper abdomen, there is aortic atherosclerosis. No evident adrenal lesions. There are renal cysts on the left, largest measuring 5.9 x 5.5 cm. There is a right renal cyst measuring 2.2 x 2.2 cm. Musculoskeletal: There is  degenerative change throughout the thoracic spine. There are no evident blastic or lytic bone lesions. IMPRESSION: 1. Evidence of previous asbestos exposure with multiple areas of pleural calcification. Areas of interstitial prominence likely are due to asbestosis. 2. Patchy nodular opacities are noted primarily in the left upper lobe. There is an area of consolidation in the anterior segment left upper lobe measuring 2.5 x 2.0 cm. This area may represent localized rounded atelectasis due to the underlying asbestos related changes. A neoplasm with surrounding pneumonitis in this area is a differential consideration. The nodular opacities in the left upper lobe potentially could represent small neoplastic lesions as well. Given the concern for possible neoplasm and the risk of developing neoplasm in a patient with asbestos related changes, correlation with nuclear medicine PET study may be advised for further evaluation. 3.  Fairly small pleural effusions bilaterally with bibasilar consolidation, likely due to a combination of pneumonia and compressive atelectasis. 4. Adenopathy at several areas, heightening potential for underlying neoplasm. These lymph nodes also could have reactive etiology given the degree of changes in the lung parenchyma. PET-CT to further evaluate these lymph nodes is felt to be advisable. 5. Aortic atherosclerosis. Foci of great vessel and coronary artery calcification. Aortic Atherosclerosis (ICD10-I70.0). Electronically Signed   By: Lowella Grip III M.D.   On: 03/14/2018 09:04   Ct Cervical Spine Wo Contrast  Result Date: 03/13/2018 CLINICAL DATA:  Increasing falls, posttraumatic headache, fell twice this afternoon, found unconscious after being alone for 10 minutes, abrasion and bruising to forehead, history Alzheimer's, diabetes mellitus, hypertension EXAM: CT HEAD WITHOUT CONTRAST CT CERVICAL SPINE WITHOUT CONTRAST TECHNIQUE: Multidetector CT imaging of the head and cervical  spine was performed following the standard protocol without intravenous contrast. Multiplanar CT image reconstructions of the cervical spine were also generated. COMPARISON:  12/01/2007 CT head FINDINGS: CT HEAD FINDINGS Brain: Generalized atrophy increased since previous exam. Normal ventricular morphology. No midline shift or mass effect. Small vessel chronic ischemic changes of deep cerebral white matter progressive since 2009. No intracranial hemorrhage, mass lesion, evidence of acute infarction, or extra-axial fluid collection. Vascular: Atherosclerotic calcifications of internal carotid arteries bilaterally at skull base. Stent identified at LEFT vertebral artery. Skull: Intact Sinuses/Orbits: Clear Other: N/A CT CERVICAL SPINE FINDINGS Alignment: Minimal anterolisthesis at C3-C4 and C7-T1. Minimal retrolisthesis C4-C5. Skull base and vertebrae: Osseous mineralization appears mildly decreased. Skull base intact. Multilevel disc space narrowing and endplate spur formation. Multilevel facet degenerative changes. Vertebral body heights maintained without fracture or bone destruction. Encroachment upon cervical neural foramina bilaterally at multiple levels by uncovertebral spurs. Soft tissues and spinal canal: Prevertebral soft tissues normal thickness. Atherosclerotic calcifications of the carotid bifurcations bilaterally. Tiny nonspecific RIGHT thyroid nodule. Cervical soft tissues otherwise unremarkable. Disc levels:  No gross disc herniation Upper chest: Lung apices clear Other: N/A IMPRESSION: Progressive atrophy and small vessel chronic ischemic changes of deep cerebral white matter. No acute intracranial abnormalities. Multilevel degenerative disc and facet disease changes of the cervical spine. No acute cervical spine abnormalities. Electronically Signed   By: Lavonia Dana M.D.   On: 03/13/2018 19:46   Mr Brain Wo Contrast  Result Date: 03/14/2018 CLINICAL DATA:  Unexplained altered level of  consciousness EXAM: MRI HEAD WITHOUT CONTRAST TECHNIQUE: Multiplanar, multiecho pulse sequences of the brain and surrounding structures were obtained without intravenous contrast. COMPARISON:  03/13/2018 FINDINGS: Brain: No acute infarction, hemorrhage, hydrocephalus, extra-axial collection or mass lesion. Atrophy that is severe and diffuse. Marked medial temporal involvement in this patient with history of Alzheimer's disease. Confluent chronic small vessel ischemic gliosis in the periventricular white matter. Small remote cerebellar infarcts. Vascular: Major flow voids are preserved Skull and upper cervical spine: No evidence of marrow lesion Sinuses/Orbits: Bilateral cataract resection.  No acute finding IMPRESSION: 1. Motion degraded study without acute finding. 2. Severe brain atrophy and advanced chronic small vessel disease. Electronically Signed   By: Monte Fantasia M.D.   On: 03/14/2018 10:27   ECHO  ------------------------------------------------------------------- Study Conclusions  - Left ventricle: The cavity size was normal. Wall thickness was   normal. Systolic function was normal. The estimated ejection   fraction was in the range of 55% to 60%. The study is not   technically sufficient to allow evaluation of LV diastolic   function. - Aortic valve: Poorly visualized can&'t tell if tri leaflet.  There   was mild stenosis. There was mild regurgitation. Valve area   (VTI): 2.01 cm^2. Valve area (Vmax): 1.73 cm^2. Valve area   (Vmean): 1.68 cm^2. - Mitral valve: Calcified annulus. There was mild regurgitation.  Discharge Exam: Vitals:   03/15/18 0500 03/15/18 1300  BP: 133/67   Pulse: 90   Resp: 18   Temp: 98 F (36.7 C)   SpO2: 100% 98%   Vitals:   03/14/18 1535 03/14/18 2156 03/15/18 0500 03/15/18 1300  BP: (!) 141/57 140/65 133/67   Pulse: 91 92 90   Resp: 18 18 18    Temp: 98 F (36.7 C) 98 F (36.7 C) 98 F (36.7 C)   TempSrc:  Axillary Axillary   SpO2: 100%  100% 100% 98%  Weight:      Height:        General: Pleasantly confused  Cardiovascular: RRR, S1/S2 +, no rubs, no gallops Respiratory: Mild decrease BS b/l, diffuse mild rhonchi  Abdominal: Soft, NT, ND, bowel sounds + Extremities: no edema  The results of significant diagnostics from this hospitalization (including imaging, microbiology, ancillary and laboratory) are listed below for reference.     Microbiology: Recent Results (from the past 240 hour(s))  MRSA PCR Screening     Status: None   Collection Time: 03/13/18 12:32 AM  Result Value Ref Range Status   MRSA by PCR NEGATIVE NEGATIVE Final    Comment:        The GeneXpert MRSA Assay (FDA approved for NASAL specimens only), is one component of a comprehensive MRSA colonization surveillance program. It is not intended to diagnose MRSA infection nor to guide or monitor treatment for MRSA infections. Performed at [facility], [place] [address] [place], [place] [address]   Culture, blood (Routine x 2)     Status: None (Preliminary result)   Collection Time: 03/13/18  8:20 PM  Result Value Ref Range Status   Specimen Description   Final    BLOOD LEFT ANTECUBITAL Performed at [facility] [address], [place], [place] [address]    Special Requests   Final    BOTTLES DRAWN AEROBIC AND ANAEROBIC Blood Culture adequate volume Performed at [facility] [address], [place], [place] [address]    Culture   Final    NO GROWTH 2 DAYS Performed at [facility] [address], [place], [place] [address]    Report Status PENDING  Incomplete  Culture, blood (Routine x 2)     Status: None (Preliminary result)   Collection Time: 03/13/18  8:20 PM  Result Value Ref Range Status   Specimen Description   Final    BLOOD BLOOD LEFT FOREARM Performed at [facility] [address] [place], [place] [address]    Special Requests   Final     BOTTLES DRAWN AEROBIC AND ANAEROBIC Blood Culture adequate volume Performed at [facility] [address], [place], [place] [address]    Culture   Final    NO GROWTH 2 DAYS Performed at [facility] [address], [place], [place] [address]    Report Status PENDING  Incomplete     Labs: BNP (last 3 results) Recent Labs    03/13/18 1853  BNP 759.1*   Basic Metabolic Panel: Recent Labs  Lab 03/13/18 1853 03/14/18 0417 03/15/18 0423  NA 140 147* 147*  K 5.3* 5.1 3.8  CL 104 114* 115*  CO2 22  24 25  GLUCOSE 162* 157* 136*  BUN 29* 31* 30*  CREATININE 1.63* 1.40* 1.12  CALCIUM 8.6* 7.9* 8.3*   Liver Function Tests: Recent Labs  Lab 03/13/18 1853 03/14/18 0417  AST 31 17  ALT 8 9  ALKPHOS 39 34*  BILITOT 1.4* 0.4  PROT 5.8* 5.4*  ALBUMIN 2.8* 2.4*   No results for input(s): LIPASE, AMYLASE in the last 168 hours. No results for input(s): AMMONIA in the last 168 hours. CBC: Recent Labs  Lab 03/13/18 1853 03/14/18 0417 03/15/18 0423  WBC 17.6* 16.1* 15.0*  NEUTROABS 14.8*  --   --   HGB 10.3* 9.3* 9.3*  HCT 31.8* 29.0* 29.2*  MCV 96.7 98.3 97.3  PLT 183 149* 151   Cardiac Enzymes: Recent Labs  Lab 03/13/18 1853 03/13/18 2236 03/14/18 0417 03/14/18 1051  TROPONINI 0.03* 0.03* 0.03* 0.06*   BNP: Invalid input(s): POCBNP CBG: Recent Labs  Lab 03/13/18 2023  GLUCAP 81   D-Dimer No results for input(s): DDIMER in the last 72 hours. Hgb A1c No results for input(s): HGBA1C in the last 72 hours. Lipid Profile No results for input(s): CHOL, HDL, LDLCALC, TRIG, CHOLHDL, LDLDIRECT in the last 72 hours. Thyroid function studies Recent Labs    03/13/18 2236  TSH 1.546   Anemia work up Recent Labs    03/13/18 2236  VITAMINB12 700   Urinalysis    Component Value Date/Time   COLORURINE AMBER (A) 03/13/2018 2020   APPEARANCEUR CLEAR 03/13/2018 2020   LABSPEC 1.020 03/13/2018 2020   PHURINE 5.0 03/13/2018 2020    GLUCOSEU NEGATIVE 03/13/2018 2020   HGBUR MODERATE (A) 03/13/2018 2020   BILIRUBINUR NEGATIVE 03/13/2018 2020   BILIRUBINUR negative 07/14/2015 1312   BILIRUBINUR neg 01/15/2012 1000   KETONESUR 5 (A) 03/13/2018 2020   PROTEINUR 30 (A) 03/13/2018 2020   UROBILINOGEN 0.2 07/14/2015 1312   UROBILINOGEN 0.2 11/05/2011 1528   NITRITE NEGATIVE 03/13/2018 2020   LEUKOCYTESUR NEGATIVE 03/13/2018 2020   Sepsis Labs Invalid input(s): PROCALCITONIN,  WBC,  LACTICIDVEN Microbiology Recent Results (from the past 240 hour(s))  MRSA PCR Screening     Status: None   Collection Time: 03/13/18 12:32 AM  Result Value Ref Range Status   MRSA by PCR NEGATIVE NEGATIVE Final    Comment:        The GeneXpert MRSA Assay (FDA approved for NASAL specimens only), is one component of a comprehensive MRSA colonization surveillance program. It is not intended to diagnose MRSA infection nor to guide or monitor treatment for MRSA infections. Performed at [facility], [place] [address], [place], [place] [address]   Culture, blood (Routine x 2)     Status: None (Preliminary result)   Collection Time: 03/13/18  8:20 PM  Result Value Ref Range Status   Specimen Description   Final    BLOOD LEFT ANTECUBITAL Performed at [facility] [address], [place], [place] [address]    Special Requests   Final    BOTTLES DRAWN AEROBIC AND ANAEROBIC Blood Culture adequate volume Performed at [facility] [address], [place], [place] [address]    Culture   Final    NO GROWTH 2 DAYS Performed at [facility] [address], [place], [place] [address]    Report Status PENDING  Incomplete  Culture, blood (Routine x 2)     Status: None (Preliminary result)   Collection Time: 03/13/18  8:20 PM  Result Value Ref Range  Status   Specimen Description   Final    BLOOD BLOOD LEFT FOREARM Performed at [facility] [address]  [place], [place] [address]    Special Requests   Final    BOTTLES DRAWN AEROBIC AND ANAEROBIC Blood Culture adequate volume Performed at [facility] [address] [place], [place] [address]    Culture   Final    NO GROWTH 2 DAYS Performed at [facility] [address], [place],  [address]    Report Status PENDING  Incomplete    Time coordinating discharge: 32 minutes  SIGNED:  Chipper Oman, MD  [facility] 03/15/2018, 2:43 PM  Pager please text page via  [email or web address]  Note - This record has been created using [employer]. Chart creation errors have been sought, but may not always have been located. Such creation errors do not reflect on the standard of medical care.

## 2018-03-15 NOTE — Progress Notes (Signed)
Pt being discharged via [facility] to [facility]. Report called to Lelon Frohlich, RN at the facility. Currently awaiting [facility] transport to arrive. D/c papers are in packet.

## 2018-03-15 NOTE — Progress Notes (Signed)
Pt picked up by [facility] at 1932 and transported to [facility]. Pt at baseline at time of transport.

## 2018-03-17 DIAGNOSIS — N183 Chronic kidney disease, stage 3 (moderate): Secondary | ICD-10-CM | POA: Diagnosis not present

## 2018-03-17 DIAGNOSIS — J9601 Acute respiratory failure with hypoxia: Secondary | ICD-10-CM | POA: Diagnosis not present

## 2018-03-17 DIAGNOSIS — R748 Abnormal levels of other serum enzymes: Secondary | ICD-10-CM | POA: Diagnosis not present

## 2018-03-17 DIAGNOSIS — N400 Enlarged prostate without lower urinary tract symptoms: Secondary | ICD-10-CM | POA: Diagnosis not present

## 2018-03-17 DIAGNOSIS — R52 Pain, unspecified: Secondary | ICD-10-CM | POA: Diagnosis not present

## 2018-03-17 DIAGNOSIS — J159 Unspecified bacterial pneumonia: Secondary | ICD-10-CM | POA: Diagnosis not present

## 2018-03-17 DIAGNOSIS — I10 Essential (primary) hypertension: Secondary | ICD-10-CM | POA: Diagnosis not present

## 2018-03-17 DIAGNOSIS — K219 Gastro-esophageal reflux disease without esophagitis: Secondary | ICD-10-CM | POA: Diagnosis not present

## 2018-03-17 DIAGNOSIS — G9341 Metabolic encephalopathy: Secondary | ICD-10-CM | POA: Diagnosis not present

## 2018-03-17 DIAGNOSIS — M6281 Muscle weakness (generalized): Secondary | ICD-10-CM | POA: Diagnosis not present

## 2018-03-17 DIAGNOSIS — R911 Solitary pulmonary nodule: Secondary | ICD-10-CM | POA: Diagnosis not present

## 2018-03-17 DIAGNOSIS — R1310 Dysphagia, unspecified: Secondary | ICD-10-CM | POA: Diagnosis not present

## 2018-03-18 LAB — CULTURE, BLOOD (ROUTINE X 2)
CULTURE: NO GROWTH
Culture: NO GROWTH
Special Requests: ADEQUATE
Special Requests: ADEQUATE

## 2018-03-20 DIAGNOSIS — R52 Pain, unspecified: Secondary | ICD-10-CM | POA: Diagnosis not present

## 2018-03-20 DIAGNOSIS — K219 Gastro-esophageal reflux disease without esophagitis: Secondary | ICD-10-CM | POA: Diagnosis not present

## 2018-03-20 DIAGNOSIS — R748 Abnormal levels of other serum enzymes: Secondary | ICD-10-CM | POA: Diagnosis not present

## 2018-03-20 DIAGNOSIS — R1310 Dysphagia, unspecified: Secondary | ICD-10-CM | POA: Diagnosis not present

## 2018-03-20 DIAGNOSIS — R911 Solitary pulmonary nodule: Secondary | ICD-10-CM | POA: Diagnosis not present

## 2018-03-20 DIAGNOSIS — M6281 Muscle weakness (generalized): Secondary | ICD-10-CM | POA: Diagnosis not present

## 2018-03-20 DIAGNOSIS — N183 Chronic kidney disease, stage 3 (moderate): Secondary | ICD-10-CM | POA: Diagnosis not present

## 2018-03-20 DIAGNOSIS — N400 Enlarged prostate without lower urinary tract symptoms: Secondary | ICD-10-CM | POA: Diagnosis not present

## 2018-03-20 DIAGNOSIS — J159 Unspecified bacterial pneumonia: Secondary | ICD-10-CM | POA: Diagnosis not present

## 2018-03-20 DIAGNOSIS — J9601 Acute respiratory failure with hypoxia: Secondary | ICD-10-CM | POA: Diagnosis not present

## 2018-03-20 DIAGNOSIS — G9341 Metabolic encephalopathy: Secondary | ICD-10-CM | POA: Diagnosis not present

## 2018-03-20 DIAGNOSIS — I10 Essential (primary) hypertension: Secondary | ICD-10-CM | POA: Diagnosis not present

## 2018-03-22 DIAGNOSIS — R52 Pain, unspecified: Secondary | ICD-10-CM | POA: Diagnosis not present

## 2018-03-22 DIAGNOSIS — K219 Gastro-esophageal reflux disease without esophagitis: Secondary | ICD-10-CM | POA: Diagnosis not present

## 2018-03-22 DIAGNOSIS — N400 Enlarged prostate without lower urinary tract symptoms: Secondary | ICD-10-CM | POA: Diagnosis not present

## 2018-03-22 DIAGNOSIS — R569 Unspecified convulsions: Secondary | ICD-10-CM | POA: Diagnosis not present

## 2018-03-22 DIAGNOSIS — N183 Chronic kidney disease, stage 3 (moderate): Secondary | ICD-10-CM | POA: Diagnosis not present

## 2018-03-22 DIAGNOSIS — D649 Anemia, unspecified: Secondary | ICD-10-CM | POA: Diagnosis not present

## 2018-03-22 DIAGNOSIS — H04129 Dry eye syndrome of unspecified lacrimal gland: Secondary | ICD-10-CM | POA: Diagnosis not present

## 2018-03-22 DIAGNOSIS — J9601 Acute respiratory failure with hypoxia: Secondary | ICD-10-CM | POA: Diagnosis not present

## 2018-03-22 DIAGNOSIS — R1310 Dysphagia, unspecified: Secondary | ICD-10-CM | POA: Diagnosis not present

## 2018-03-22 DIAGNOSIS — Z79899 Other long term (current) drug therapy: Secondary | ICD-10-CM | POA: Diagnosis not present

## 2018-03-22 DIAGNOSIS — R911 Solitary pulmonary nodule: Secondary | ICD-10-CM | POA: Diagnosis not present

## 2018-03-22 DIAGNOSIS — J159 Unspecified bacterial pneumonia: Secondary | ICD-10-CM | POA: Diagnosis not present

## 2018-03-22 DIAGNOSIS — M6281 Muscle weakness (generalized): Secondary | ICD-10-CM | POA: Diagnosis not present

## 2018-03-22 DIAGNOSIS — G9341 Metabolic encephalopathy: Secondary | ICD-10-CM | POA: Diagnosis not present

## 2018-03-22 DIAGNOSIS — I10 Essential (primary) hypertension: Secondary | ICD-10-CM | POA: Diagnosis not present

## 2018-03-23 DIAGNOSIS — N189 Chronic kidney disease, unspecified: Secondary | ICD-10-CM | POA: Diagnosis not present

## 2018-03-23 DIAGNOSIS — R41841 Cognitive communication deficit: Secondary | ICD-10-CM | POA: Diagnosis not present

## 2018-03-23 DIAGNOSIS — M6281 Muscle weakness (generalized): Secondary | ICD-10-CM | POA: Diagnosis not present

## 2018-03-23 DIAGNOSIS — E119 Type 2 diabetes mellitus without complications: Secondary | ICD-10-CM | POA: Diagnosis not present

## 2018-03-23 DIAGNOSIS — R52 Pain, unspecified: Secondary | ICD-10-CM | POA: Diagnosis not present

## 2018-03-23 DIAGNOSIS — K5900 Constipation, unspecified: Secondary | ICD-10-CM | POA: Diagnosis not present

## 2018-03-23 DIAGNOSIS — E43 Unspecified severe protein-calorie malnutrition: Secondary | ICD-10-CM | POA: Diagnosis not present

## 2018-03-23 DIAGNOSIS — J189 Pneumonia, unspecified organism: Secondary | ICD-10-CM | POA: Diagnosis not present

## 2018-03-23 DIAGNOSIS — Z111 Encounter for screening for respiratory tuberculosis: Secondary | ICD-10-CM | POA: Diagnosis not present

## 2018-03-23 DIAGNOSIS — E875 Hyperkalemia: Secondary | ICD-10-CM | POA: Diagnosis not present

## 2018-03-23 DIAGNOSIS — G9341 Metabolic encephalopathy: Secondary | ICD-10-CM | POA: Diagnosis not present

## 2018-03-23 DIAGNOSIS — F0390 Unspecified dementia without behavioral disturbance: Secondary | ICD-10-CM | POA: Diagnosis not present

## 2018-03-23 DIAGNOSIS — I10 Essential (primary) hypertension: Secondary | ICD-10-CM | POA: Diagnosis not present

## 2018-03-24 DIAGNOSIS — E119 Type 2 diabetes mellitus without complications: Secondary | ICD-10-CM | POA: Diagnosis not present

## 2018-03-24 DIAGNOSIS — E43 Unspecified severe protein-calorie malnutrition: Secondary | ICD-10-CM | POA: Diagnosis not present

## 2018-03-24 DIAGNOSIS — I10 Essential (primary) hypertension: Secondary | ICD-10-CM | POA: Diagnosis not present

## 2018-03-24 DIAGNOSIS — M6281 Muscle weakness (generalized): Secondary | ICD-10-CM | POA: Diagnosis not present

## 2018-03-24 DIAGNOSIS — N189 Chronic kidney disease, unspecified: Secondary | ICD-10-CM | POA: Diagnosis not present

## 2018-03-24 DIAGNOSIS — E875 Hyperkalemia: Secondary | ICD-10-CM | POA: Diagnosis not present

## 2018-03-25 DIAGNOSIS — R1310 Dysphagia, unspecified: Secondary | ICD-10-CM | POA: Diagnosis not present

## 2018-03-25 DIAGNOSIS — N183 Chronic kidney disease, stage 3 (moderate): Secondary | ICD-10-CM | POA: Diagnosis not present

## 2018-03-27 DIAGNOSIS — J9601 Acute respiratory failure with hypoxia: Secondary | ICD-10-CM | POA: Diagnosis not present

## 2018-03-27 DIAGNOSIS — K219 Gastro-esophageal reflux disease without esophagitis: Secondary | ICD-10-CM | POA: Diagnosis not present

## 2018-03-27 DIAGNOSIS — N189 Chronic kidney disease, unspecified: Secondary | ICD-10-CM | POA: Diagnosis not present

## 2018-03-27 DIAGNOSIS — I10 Essential (primary) hypertension: Secondary | ICD-10-CM | POA: Diagnosis not present

## 2018-03-27 DIAGNOSIS — R52 Pain, unspecified: Secondary | ICD-10-CM | POA: Diagnosis not present

## 2018-03-27 DIAGNOSIS — F0390 Unspecified dementia without behavioral disturbance: Secondary | ICD-10-CM | POA: Diagnosis not present

## 2018-03-27 DIAGNOSIS — K5900 Constipation, unspecified: Secondary | ICD-10-CM | POA: Diagnosis not present

## 2018-03-27 DIAGNOSIS — J189 Pneumonia, unspecified organism: Secondary | ICD-10-CM | POA: Diagnosis not present

## 2018-03-27 DIAGNOSIS — N183 Chronic kidney disease, stage 3 (moderate): Secondary | ICD-10-CM | POA: Diagnosis not present

## 2018-03-27 DIAGNOSIS — H04129 Dry eye syndrome of unspecified lacrimal gland: Secondary | ICD-10-CM | POA: Diagnosis not present

## 2018-03-27 DIAGNOSIS — R41841 Cognitive communication deficit: Secondary | ICD-10-CM | POA: Diagnosis not present

## 2018-03-27 DIAGNOSIS — E875 Hyperkalemia: Secondary | ICD-10-CM | POA: Diagnosis not present

## 2018-03-27 DIAGNOSIS — R911 Solitary pulmonary nodule: Secondary | ICD-10-CM | POA: Diagnosis not present

## 2018-03-27 DIAGNOSIS — Z111 Encounter for screening for respiratory tuberculosis: Secondary | ICD-10-CM | POA: Diagnosis not present

## 2018-03-27 DIAGNOSIS — M6281 Muscle weakness (generalized): Secondary | ICD-10-CM | POA: Diagnosis not present

## 2018-03-27 DIAGNOSIS — N400 Enlarged prostate without lower urinary tract symptoms: Secondary | ICD-10-CM | POA: Diagnosis not present

## 2018-03-27 DIAGNOSIS — E119 Type 2 diabetes mellitus without complications: Secondary | ICD-10-CM | POA: Diagnosis not present

## 2018-03-27 DIAGNOSIS — J159 Unspecified bacterial pneumonia: Secondary | ICD-10-CM | POA: Diagnosis not present

## 2018-03-27 DIAGNOSIS — E43 Unspecified severe protein-calorie malnutrition: Secondary | ICD-10-CM | POA: Diagnosis not present

## 2018-03-27 DIAGNOSIS — R1310 Dysphagia, unspecified: Secondary | ICD-10-CM | POA: Diagnosis not present

## 2018-03-27 DIAGNOSIS — G9341 Metabolic encephalopathy: Secondary | ICD-10-CM | POA: Diagnosis not present

## 2018-03-28 DIAGNOSIS — I10 Essential (primary) hypertension: Secondary | ICD-10-CM | POA: Diagnosis not present

## 2018-03-28 DIAGNOSIS — M6281 Muscle weakness (generalized): Secondary | ICD-10-CM | POA: Diagnosis not present

## 2018-03-28 DIAGNOSIS — E43 Unspecified severe protein-calorie malnutrition: Secondary | ICD-10-CM | POA: Diagnosis not present

## 2018-03-28 DIAGNOSIS — N189 Chronic kidney disease, unspecified: Secondary | ICD-10-CM | POA: Diagnosis not present

## 2018-03-28 DIAGNOSIS — E119 Type 2 diabetes mellitus without complications: Secondary | ICD-10-CM | POA: Diagnosis not present

## 2018-03-28 DIAGNOSIS — E875 Hyperkalemia: Secondary | ICD-10-CM | POA: Diagnosis not present

## 2018-03-29 DIAGNOSIS — R296 Repeated falls: Secondary | ICD-10-CM | POA: Diagnosis not present

## 2018-03-29 DIAGNOSIS — D72829 Elevated white blood cell count, unspecified: Secondary | ICD-10-CM | POA: Diagnosis not present

## 2018-03-30 DIAGNOSIS — I739 Peripheral vascular disease, unspecified: Secondary | ICD-10-CM | POA: Diagnosis not present

## 2018-03-30 DIAGNOSIS — E1159 Type 2 diabetes mellitus with other circulatory complications: Secondary | ICD-10-CM | POA: Diagnosis not present

## 2018-03-30 DIAGNOSIS — K219 Gastro-esophageal reflux disease without esophagitis: Secondary | ICD-10-CM | POA: Diagnosis not present

## 2018-03-30 DIAGNOSIS — F339 Major depressive disorder, recurrent, unspecified: Secondary | ICD-10-CM | POA: Diagnosis not present

## 2018-03-30 DIAGNOSIS — I672 Cerebral atherosclerosis: Secondary | ICD-10-CM | POA: Diagnosis not present

## 2018-03-30 DIAGNOSIS — E875 Hyperkalemia: Secondary | ICD-10-CM | POA: Diagnosis not present

## 2018-03-30 DIAGNOSIS — J189 Pneumonia, unspecified organism: Secondary | ICD-10-CM | POA: Diagnosis not present

## 2018-03-30 DIAGNOSIS — I350 Nonrheumatic aortic (valve) stenosis: Secondary | ICD-10-CM | POA: Diagnosis not present

## 2018-03-30 DIAGNOSIS — K222 Esophageal obstruction: Secondary | ICD-10-CM | POA: Diagnosis not present

## 2018-03-30 DIAGNOSIS — F0150 Vascular dementia without behavioral disturbance: Secondary | ICD-10-CM | POA: Diagnosis not present

## 2018-03-30 DIAGNOSIS — I679 Cerebrovascular disease, unspecified: Secondary | ICD-10-CM | POA: Diagnosis not present

## 2018-03-30 DIAGNOSIS — R1310 Dysphagia, unspecified: Secondary | ICD-10-CM | POA: Diagnosis not present

## 2018-03-30 DIAGNOSIS — R319 Hematuria, unspecified: Secondary | ICD-10-CM | POA: Diagnosis not present

## 2018-03-30 DIAGNOSIS — G619 Inflammatory polyneuropathy, unspecified: Secondary | ICD-10-CM | POA: Diagnosis not present

## 2018-03-30 DIAGNOSIS — N189 Chronic kidney disease, unspecified: Secondary | ICD-10-CM | POA: Diagnosis not present

## 2018-03-30 DIAGNOSIS — D649 Anemia, unspecified: Secondary | ICD-10-CM | POA: Diagnosis not present

## 2018-03-30 DIAGNOSIS — D72829 Elevated white blood cell count, unspecified: Secondary | ICD-10-CM | POA: Diagnosis not present

## 2018-03-30 DIAGNOSIS — E119 Type 2 diabetes mellitus without complications: Secondary | ICD-10-CM | POA: Diagnosis not present

## 2018-03-30 DIAGNOSIS — E785 Hyperlipidemia, unspecified: Secondary | ICD-10-CM | POA: Diagnosis not present

## 2018-03-30 DIAGNOSIS — R630 Anorexia: Secondary | ICD-10-CM | POA: Diagnosis not present

## 2018-03-30 DIAGNOSIS — M109 Gout, unspecified: Secondary | ICD-10-CM | POA: Diagnosis not present

## 2018-03-30 DIAGNOSIS — M6281 Muscle weakness (generalized): Secondary | ICD-10-CM | POA: Diagnosis not present

## 2018-03-30 DIAGNOSIS — I10 Essential (primary) hypertension: Secondary | ICD-10-CM | POA: Diagnosis not present

## 2018-03-30 DIAGNOSIS — N390 Urinary tract infection, site not specified: Secondary | ICD-10-CM | POA: Diagnosis not present

## 2018-03-30 DIAGNOSIS — E43 Unspecified severe protein-calorie malnutrition: Secondary | ICD-10-CM | POA: Diagnosis not present

## 2018-03-31 DIAGNOSIS — F0150 Vascular dementia without behavioral disturbance: Secondary | ICD-10-CM | POA: Diagnosis not present

## 2018-03-31 DIAGNOSIS — J189 Pneumonia, unspecified organism: Secondary | ICD-10-CM | POA: Diagnosis not present

## 2018-03-31 DIAGNOSIS — R1310 Dysphagia, unspecified: Secondary | ICD-10-CM | POA: Diagnosis not present

## 2018-03-31 DIAGNOSIS — R630 Anorexia: Secondary | ICD-10-CM | POA: Diagnosis not present

## 2018-03-31 DIAGNOSIS — K222 Esophageal obstruction: Secondary | ICD-10-CM | POA: Diagnosis not present

## 2018-03-31 DIAGNOSIS — I672 Cerebral atherosclerosis: Secondary | ICD-10-CM | POA: Diagnosis not present

## 2018-04-01 DIAGNOSIS — I672 Cerebral atherosclerosis: Secondary | ICD-10-CM | POA: Diagnosis not present

## 2018-04-01 DIAGNOSIS — F0150 Vascular dementia without behavioral disturbance: Secondary | ICD-10-CM | POA: Diagnosis not present

## 2018-04-01 DIAGNOSIS — R630 Anorexia: Secondary | ICD-10-CM | POA: Diagnosis not present

## 2018-04-01 DIAGNOSIS — J189 Pneumonia, unspecified organism: Secondary | ICD-10-CM | POA: Diagnosis not present

## 2018-04-01 DIAGNOSIS — R1310 Dysphagia, unspecified: Secondary | ICD-10-CM | POA: Diagnosis not present

## 2018-04-01 DIAGNOSIS — K222 Esophageal obstruction: Secondary | ICD-10-CM | POA: Diagnosis not present

## 2018-04-03 DIAGNOSIS — R911 Solitary pulmonary nodule: Secondary | ICD-10-CM | POA: Diagnosis not present

## 2018-04-03 DIAGNOSIS — J9601 Acute respiratory failure with hypoxia: Secondary | ICD-10-CM | POA: Diagnosis not present

## 2018-04-03 DIAGNOSIS — K219 Gastro-esophageal reflux disease without esophagitis: Secondary | ICD-10-CM | POA: Diagnosis not present

## 2018-04-03 DIAGNOSIS — G9341 Metabolic encephalopathy: Secondary | ICD-10-CM | POA: Diagnosis not present

## 2018-04-03 DIAGNOSIS — N400 Enlarged prostate without lower urinary tract symptoms: Secondary | ICD-10-CM | POA: Diagnosis not present

## 2018-04-03 DIAGNOSIS — R1310 Dysphagia, unspecified: Secondary | ICD-10-CM | POA: Diagnosis not present

## 2018-04-03 DIAGNOSIS — R630 Anorexia: Secondary | ICD-10-CM | POA: Diagnosis not present

## 2018-04-03 DIAGNOSIS — N183 Chronic kidney disease, stage 3 (moderate): Secondary | ICD-10-CM | POA: Diagnosis not present

## 2018-04-03 DIAGNOSIS — R296 Repeated falls: Secondary | ICD-10-CM | POA: Diagnosis not present

## 2018-04-03 DIAGNOSIS — M6281 Muscle weakness (generalized): Secondary | ICD-10-CM | POA: Diagnosis not present

## 2018-04-03 DIAGNOSIS — R52 Pain, unspecified: Secondary | ICD-10-CM | POA: Diagnosis not present

## 2018-04-03 DIAGNOSIS — J159 Unspecified bacterial pneumonia: Secondary | ICD-10-CM | POA: Diagnosis not present

## 2018-04-03 DIAGNOSIS — I672 Cerebral atherosclerosis: Secondary | ICD-10-CM | POA: Diagnosis not present

## 2018-04-03 DIAGNOSIS — F0150 Vascular dementia without behavioral disturbance: Secondary | ICD-10-CM | POA: Diagnosis not present

## 2018-04-03 DIAGNOSIS — K222 Esophageal obstruction: Secondary | ICD-10-CM | POA: Diagnosis not present

## 2018-04-03 DIAGNOSIS — J189 Pneumonia, unspecified organism: Secondary | ICD-10-CM | POA: Diagnosis not present

## 2018-04-03 DIAGNOSIS — I10 Essential (primary) hypertension: Secondary | ICD-10-CM | POA: Diagnosis not present

## 2018-04-04 DIAGNOSIS — R630 Anorexia: Secondary | ICD-10-CM | POA: Diagnosis not present

## 2018-04-04 DIAGNOSIS — I672 Cerebral atherosclerosis: Secondary | ICD-10-CM | POA: Diagnosis not present

## 2018-04-04 DIAGNOSIS — K222 Esophageal obstruction: Secondary | ICD-10-CM | POA: Diagnosis not present

## 2018-04-04 DIAGNOSIS — R1310 Dysphagia, unspecified: Secondary | ICD-10-CM | POA: Diagnosis not present

## 2018-04-04 DIAGNOSIS — F0150 Vascular dementia without behavioral disturbance: Secondary | ICD-10-CM | POA: Diagnosis not present

## 2018-04-04 DIAGNOSIS — J189 Pneumonia, unspecified organism: Secondary | ICD-10-CM | POA: Diagnosis not present

## 2018-04-06 DIAGNOSIS — K222 Esophageal obstruction: Secondary | ICD-10-CM | POA: Diagnosis not present

## 2018-04-06 DIAGNOSIS — R630 Anorexia: Secondary | ICD-10-CM | POA: Diagnosis not present

## 2018-04-06 DIAGNOSIS — I672 Cerebral atherosclerosis: Secondary | ICD-10-CM | POA: Diagnosis not present

## 2018-04-06 DIAGNOSIS — F0150 Vascular dementia without behavioral disturbance: Secondary | ICD-10-CM | POA: Diagnosis not present

## 2018-04-06 DIAGNOSIS — J189 Pneumonia, unspecified organism: Secondary | ICD-10-CM | POA: Diagnosis not present

## 2018-04-06 DIAGNOSIS — R1310 Dysphagia, unspecified: Secondary | ICD-10-CM | POA: Diagnosis not present

## 2018-04-11 DIAGNOSIS — I672 Cerebral atherosclerosis: Secondary | ICD-10-CM | POA: Diagnosis not present

## 2018-04-11 DIAGNOSIS — K222 Esophageal obstruction: Secondary | ICD-10-CM | POA: Diagnosis not present

## 2018-04-11 DIAGNOSIS — F0150 Vascular dementia without behavioral disturbance: Secondary | ICD-10-CM | POA: Diagnosis not present

## 2018-04-11 DIAGNOSIS — R630 Anorexia: Secondary | ICD-10-CM | POA: Diagnosis not present

## 2018-04-11 DIAGNOSIS — R1310 Dysphagia, unspecified: Secondary | ICD-10-CM | POA: Diagnosis not present

## 2018-04-11 DIAGNOSIS — J189 Pneumonia, unspecified organism: Secondary | ICD-10-CM | POA: Diagnosis not present

## 2018-04-12 DIAGNOSIS — F331 Major depressive disorder, recurrent, moderate: Secondary | ICD-10-CM | POA: Diagnosis not present

## 2018-04-12 DIAGNOSIS — F5105 Insomnia due to other mental disorder: Secondary | ICD-10-CM | POA: Diagnosis not present

## 2018-04-12 DIAGNOSIS — F0391 Unspecified dementia with behavioral disturbance: Secondary | ICD-10-CM | POA: Diagnosis not present

## 2018-04-13 DIAGNOSIS — J189 Pneumonia, unspecified organism: Secondary | ICD-10-CM | POA: Diagnosis not present

## 2018-04-13 DIAGNOSIS — R1310 Dysphagia, unspecified: Secondary | ICD-10-CM | POA: Diagnosis not present

## 2018-04-13 DIAGNOSIS — I672 Cerebral atherosclerosis: Secondary | ICD-10-CM | POA: Diagnosis not present

## 2018-04-13 DIAGNOSIS — R630 Anorexia: Secondary | ICD-10-CM | POA: Diagnosis not present

## 2018-04-13 DIAGNOSIS — K222 Esophageal obstruction: Secondary | ICD-10-CM | POA: Diagnosis not present

## 2018-04-13 DIAGNOSIS — F0150 Vascular dementia without behavioral disturbance: Secondary | ICD-10-CM | POA: Diagnosis not present

## 2018-04-18 DIAGNOSIS — I672 Cerebral atherosclerosis: Secondary | ICD-10-CM | POA: Diagnosis not present

## 2018-04-18 DIAGNOSIS — F0150 Vascular dementia without behavioral disturbance: Secondary | ICD-10-CM | POA: Diagnosis not present

## 2018-04-18 DIAGNOSIS — J189 Pneumonia, unspecified organism: Secondary | ICD-10-CM | POA: Diagnosis not present

## 2018-04-18 DIAGNOSIS — K222 Esophageal obstruction: Secondary | ICD-10-CM | POA: Diagnosis not present

## 2018-04-18 DIAGNOSIS — R630 Anorexia: Secondary | ICD-10-CM | POA: Diagnosis not present

## 2018-04-18 DIAGNOSIS — R1310 Dysphagia, unspecified: Secondary | ICD-10-CM | POA: Diagnosis not present

## 2018-04-19 DIAGNOSIS — J189 Pneumonia, unspecified organism: Secondary | ICD-10-CM | POA: Diagnosis not present

## 2018-04-19 DIAGNOSIS — K222 Esophageal obstruction: Secondary | ICD-10-CM | POA: Diagnosis not present

## 2018-04-19 DIAGNOSIS — I672 Cerebral atherosclerosis: Secondary | ICD-10-CM | POA: Diagnosis not present

## 2018-04-19 DIAGNOSIS — R630 Anorexia: Secondary | ICD-10-CM | POA: Diagnosis not present

## 2018-04-19 DIAGNOSIS — F0150 Vascular dementia without behavioral disturbance: Secondary | ICD-10-CM | POA: Diagnosis not present

## 2018-04-19 DIAGNOSIS — R1310 Dysphagia, unspecified: Secondary | ICD-10-CM | POA: Diagnosis not present

## 2018-04-20 DIAGNOSIS — F0150 Vascular dementia without behavioral disturbance: Secondary | ICD-10-CM | POA: Diagnosis not present

## 2018-04-20 DIAGNOSIS — J189 Pneumonia, unspecified organism: Secondary | ICD-10-CM | POA: Diagnosis not present

## 2018-04-20 DIAGNOSIS — R1310 Dysphagia, unspecified: Secondary | ICD-10-CM | POA: Diagnosis not present

## 2018-04-20 DIAGNOSIS — R630 Anorexia: Secondary | ICD-10-CM | POA: Diagnosis not present

## 2018-04-20 DIAGNOSIS — I672 Cerebral atherosclerosis: Secondary | ICD-10-CM | POA: Diagnosis not present

## 2018-04-20 DIAGNOSIS — K222 Esophageal obstruction: Secondary | ICD-10-CM | POA: Diagnosis not present

## 2018-04-24 DIAGNOSIS — Z9119 Patient's noncompliance with other medical treatment and regimen: Secondary | ICD-10-CM | POA: Diagnosis not present

## 2018-04-24 DIAGNOSIS — R296 Repeated falls: Secondary | ICD-10-CM | POA: Diagnosis not present

## 2018-04-24 DIAGNOSIS — G9341 Metabolic encephalopathy: Secondary | ICD-10-CM | POA: Diagnosis not present

## 2018-04-24 DIAGNOSIS — I10 Essential (primary) hypertension: Secondary | ICD-10-CM | POA: Diagnosis not present

## 2018-04-24 DIAGNOSIS — R911 Solitary pulmonary nodule: Secondary | ICD-10-CM | POA: Diagnosis not present

## 2018-04-24 DIAGNOSIS — J159 Unspecified bacterial pneumonia: Secondary | ICD-10-CM | POA: Diagnosis not present

## 2018-04-24 DIAGNOSIS — M6281 Muscle weakness (generalized): Secondary | ICD-10-CM | POA: Diagnosis not present

## 2018-04-24 DIAGNOSIS — R52 Pain, unspecified: Secondary | ICD-10-CM | POA: Diagnosis not present

## 2018-04-24 DIAGNOSIS — J9601 Acute respiratory failure with hypoxia: Secondary | ICD-10-CM | POA: Diagnosis not present

## 2018-04-24 DIAGNOSIS — R1310 Dysphagia, unspecified: Secondary | ICD-10-CM | POA: Diagnosis not present

## 2018-04-24 DIAGNOSIS — N400 Enlarged prostate without lower urinary tract symptoms: Secondary | ICD-10-CM | POA: Diagnosis not present

## 2018-04-24 DIAGNOSIS — N183 Chronic kidney disease, stage 3 (moderate): Secondary | ICD-10-CM | POA: Diagnosis not present

## 2018-04-25 DIAGNOSIS — G619 Inflammatory polyneuropathy, unspecified: Secondary | ICD-10-CM | POA: Diagnosis not present

## 2018-04-25 DIAGNOSIS — I350 Nonrheumatic aortic (valve) stenosis: Secondary | ICD-10-CM | POA: Diagnosis not present

## 2018-04-25 DIAGNOSIS — I10 Essential (primary) hypertension: Secondary | ICD-10-CM | POA: Diagnosis not present

## 2018-04-25 DIAGNOSIS — I739 Peripheral vascular disease, unspecified: Secondary | ICD-10-CM | POA: Diagnosis not present

## 2018-04-25 DIAGNOSIS — F339 Major depressive disorder, recurrent, unspecified: Secondary | ICD-10-CM | POA: Diagnosis not present

## 2018-04-25 DIAGNOSIS — R1310 Dysphagia, unspecified: Secondary | ICD-10-CM | POA: Diagnosis not present

## 2018-04-25 DIAGNOSIS — E1159 Type 2 diabetes mellitus with other circulatory complications: Secondary | ICD-10-CM | POA: Diagnosis not present

## 2018-04-25 DIAGNOSIS — I672 Cerebral atherosclerosis: Secondary | ICD-10-CM | POA: Diagnosis not present

## 2018-04-25 DIAGNOSIS — R630 Anorexia: Secondary | ICD-10-CM | POA: Diagnosis not present

## 2018-04-25 DIAGNOSIS — E785 Hyperlipidemia, unspecified: Secondary | ICD-10-CM | POA: Diagnosis not present

## 2018-04-25 DIAGNOSIS — M109 Gout, unspecified: Secondary | ICD-10-CM | POA: Diagnosis not present

## 2018-04-25 DIAGNOSIS — J189 Pneumonia, unspecified organism: Secondary | ICD-10-CM | POA: Diagnosis not present

## 2018-04-25 DIAGNOSIS — K222 Esophageal obstruction: Secondary | ICD-10-CM | POA: Diagnosis not present

## 2018-04-25 DIAGNOSIS — K219 Gastro-esophageal reflux disease without esophagitis: Secondary | ICD-10-CM | POA: Diagnosis not present

## 2018-04-25 DIAGNOSIS — I679 Cerebrovascular disease, unspecified: Secondary | ICD-10-CM | POA: Diagnosis not present

## 2018-04-25 DIAGNOSIS — F0150 Vascular dementia without behavioral disturbance: Secondary | ICD-10-CM | POA: Diagnosis not present

## 2018-04-27 DIAGNOSIS — N183 Chronic kidney disease, stage 3 (moderate): Secondary | ICD-10-CM | POA: Diagnosis not present

## 2018-04-27 DIAGNOSIS — F0151 Vascular dementia with behavioral disturbance: Secondary | ICD-10-CM | POA: Diagnosis not present

## 2018-04-27 DIAGNOSIS — F0150 Vascular dementia without behavioral disturbance: Secondary | ICD-10-CM | POA: Diagnosis not present

## 2018-04-27 DIAGNOSIS — I672 Cerebral atherosclerosis: Secondary | ICD-10-CM | POA: Diagnosis not present

## 2018-04-27 DIAGNOSIS — G894 Chronic pain syndrome: Secondary | ICD-10-CM | POA: Diagnosis not present

## 2018-04-27 DIAGNOSIS — I10 Essential (primary) hypertension: Secondary | ICD-10-CM | POA: Diagnosis not present

## 2018-04-27 DIAGNOSIS — R296 Repeated falls: Secondary | ICD-10-CM | POA: Diagnosis not present

## 2018-04-27 DIAGNOSIS — K222 Esophageal obstruction: Secondary | ICD-10-CM | POA: Diagnosis not present

## 2018-04-27 DIAGNOSIS — J189 Pneumonia, unspecified organism: Secondary | ICD-10-CM | POA: Diagnosis not present

## 2018-04-27 DIAGNOSIS — K5900 Constipation, unspecified: Secondary | ICD-10-CM | POA: Diagnosis not present

## 2018-04-27 DIAGNOSIS — K219 Gastro-esophageal reflux disease without esophagitis: Secondary | ICD-10-CM | POA: Diagnosis not present

## 2018-04-27 DIAGNOSIS — I69891 Dysphagia following other cerebrovascular disease: Secondary | ICD-10-CM | POA: Diagnosis not present

## 2018-04-27 DIAGNOSIS — R630 Anorexia: Secondary | ICD-10-CM | POA: Diagnosis not present

## 2018-04-27 DIAGNOSIS — N400 Enlarged prostate without lower urinary tract symptoms: Secondary | ICD-10-CM | POA: Diagnosis not present

## 2018-04-27 DIAGNOSIS — H3530 Unspecified macular degeneration: Secondary | ICD-10-CM | POA: Diagnosis not present

## 2018-04-27 DIAGNOSIS — R1310 Dysphagia, unspecified: Secondary | ICD-10-CM | POA: Diagnosis not present

## 2018-04-27 DIAGNOSIS — M6281 Muscle weakness (generalized): Secondary | ICD-10-CM | POA: Diagnosis not present

## 2018-04-27 DIAGNOSIS — G9341 Metabolic encephalopathy: Secondary | ICD-10-CM | POA: Diagnosis not present

## 2018-05-02 DIAGNOSIS — J189 Pneumonia, unspecified organism: Secondary | ICD-10-CM | POA: Diagnosis not present

## 2018-05-02 DIAGNOSIS — F0150 Vascular dementia without behavioral disturbance: Secondary | ICD-10-CM | POA: Diagnosis not present

## 2018-05-02 DIAGNOSIS — R1310 Dysphagia, unspecified: Secondary | ICD-10-CM | POA: Diagnosis not present

## 2018-05-02 DIAGNOSIS — K222 Esophageal obstruction: Secondary | ICD-10-CM | POA: Diagnosis not present

## 2018-05-02 DIAGNOSIS — I672 Cerebral atherosclerosis: Secondary | ICD-10-CM | POA: Diagnosis not present

## 2018-05-02 DIAGNOSIS — R630 Anorexia: Secondary | ICD-10-CM | POA: Diagnosis not present

## 2018-05-03 DIAGNOSIS — F0281 Dementia in other diseases classified elsewhere with behavioral disturbance: Secondary | ICD-10-CM | POA: Diagnosis not present

## 2018-05-03 DIAGNOSIS — K222 Esophageal obstruction: Secondary | ICD-10-CM | POA: Diagnosis not present

## 2018-05-03 DIAGNOSIS — F0150 Vascular dementia without behavioral disturbance: Secondary | ICD-10-CM | POA: Diagnosis not present

## 2018-05-03 DIAGNOSIS — J189 Pneumonia, unspecified organism: Secondary | ICD-10-CM | POA: Diagnosis not present

## 2018-05-03 DIAGNOSIS — F5105 Insomnia due to other mental disorder: Secondary | ICD-10-CM | POA: Diagnosis not present

## 2018-05-03 DIAGNOSIS — F331 Major depressive disorder, recurrent, moderate: Secondary | ICD-10-CM | POA: Diagnosis not present

## 2018-05-03 DIAGNOSIS — R630 Anorexia: Secondary | ICD-10-CM | POA: Diagnosis not present

## 2018-05-03 DIAGNOSIS — G301 Alzheimer's disease with late onset: Secondary | ICD-10-CM | POA: Diagnosis not present

## 2018-05-03 DIAGNOSIS — R1310 Dysphagia, unspecified: Secondary | ICD-10-CM | POA: Diagnosis not present

## 2018-05-03 DIAGNOSIS — I672 Cerebral atherosclerosis: Secondary | ICD-10-CM | POA: Diagnosis not present

## 2018-05-04 DIAGNOSIS — R630 Anorexia: Secondary | ICD-10-CM | POA: Diagnosis not present

## 2018-05-04 DIAGNOSIS — I672 Cerebral atherosclerosis: Secondary | ICD-10-CM | POA: Diagnosis not present

## 2018-05-04 DIAGNOSIS — J189 Pneumonia, unspecified organism: Secondary | ICD-10-CM | POA: Diagnosis not present

## 2018-05-04 DIAGNOSIS — F0150 Vascular dementia without behavioral disturbance: Secondary | ICD-10-CM | POA: Diagnosis not present

## 2018-05-04 DIAGNOSIS — R1310 Dysphagia, unspecified: Secondary | ICD-10-CM | POA: Diagnosis not present

## 2018-05-04 DIAGNOSIS — K222 Esophageal obstruction: Secondary | ICD-10-CM | POA: Diagnosis not present

## 2018-05-05 DIAGNOSIS — F0150 Vascular dementia without behavioral disturbance: Secondary | ICD-10-CM | POA: Diagnosis not present

## 2018-05-05 DIAGNOSIS — I672 Cerebral atherosclerosis: Secondary | ICD-10-CM | POA: Diagnosis not present

## 2018-05-05 DIAGNOSIS — K222 Esophageal obstruction: Secondary | ICD-10-CM | POA: Diagnosis not present

## 2018-05-05 DIAGNOSIS — R296 Repeated falls: Secondary | ICD-10-CM | POA: Diagnosis not present

## 2018-05-05 DIAGNOSIS — R1310 Dysphagia, unspecified: Secondary | ICD-10-CM | POA: Diagnosis not present

## 2018-05-05 DIAGNOSIS — F0281 Dementia in other diseases classified elsewhere with behavioral disturbance: Secondary | ICD-10-CM | POA: Diagnosis not present

## 2018-05-05 DIAGNOSIS — J189 Pneumonia, unspecified organism: Secondary | ICD-10-CM | POA: Diagnosis not present

## 2018-05-05 DIAGNOSIS — R630 Anorexia: Secondary | ICD-10-CM | POA: Diagnosis not present

## 2018-05-09 DIAGNOSIS — F0150 Vascular dementia without behavioral disturbance: Secondary | ICD-10-CM | POA: Diagnosis not present

## 2018-05-09 DIAGNOSIS — R630 Anorexia: Secondary | ICD-10-CM | POA: Diagnosis not present

## 2018-05-09 DIAGNOSIS — R1310 Dysphagia, unspecified: Secondary | ICD-10-CM | POA: Diagnosis not present

## 2018-05-09 DIAGNOSIS — I672 Cerebral atherosclerosis: Secondary | ICD-10-CM | POA: Diagnosis not present

## 2018-05-09 DIAGNOSIS — R319 Hematuria, unspecified: Secondary | ICD-10-CM | POA: Diagnosis not present

## 2018-05-09 DIAGNOSIS — N390 Urinary tract infection, site not specified: Secondary | ICD-10-CM | POA: Diagnosis not present

## 2018-05-09 DIAGNOSIS — J189 Pneumonia, unspecified organism: Secondary | ICD-10-CM | POA: Diagnosis not present

## 2018-05-09 DIAGNOSIS — K222 Esophageal obstruction: Secondary | ICD-10-CM | POA: Diagnosis not present

## 2018-05-10 DIAGNOSIS — F0281 Dementia in other diseases classified elsewhere with behavioral disturbance: Secondary | ICD-10-CM | POA: Diagnosis not present

## 2018-05-10 DIAGNOSIS — R296 Repeated falls: Secondary | ICD-10-CM | POA: Diagnosis not present

## 2018-05-11 DIAGNOSIS — F0150 Vascular dementia without behavioral disturbance: Secondary | ICD-10-CM | POA: Diagnosis not present

## 2018-05-11 DIAGNOSIS — I672 Cerebral atherosclerosis: Secondary | ICD-10-CM | POA: Diagnosis not present

## 2018-05-11 DIAGNOSIS — K222 Esophageal obstruction: Secondary | ICD-10-CM | POA: Diagnosis not present

## 2018-05-11 DIAGNOSIS — R630 Anorexia: Secondary | ICD-10-CM | POA: Diagnosis not present

## 2018-05-11 DIAGNOSIS — R1310 Dysphagia, unspecified: Secondary | ICD-10-CM | POA: Diagnosis not present

## 2018-05-11 DIAGNOSIS — J189 Pneumonia, unspecified organism: Secondary | ICD-10-CM | POA: Diagnosis not present

## 2018-05-12 DIAGNOSIS — J189 Pneumonia, unspecified organism: Secondary | ICD-10-CM | POA: Diagnosis not present

## 2018-05-12 DIAGNOSIS — F0150 Vascular dementia without behavioral disturbance: Secondary | ICD-10-CM | POA: Diagnosis not present

## 2018-05-12 DIAGNOSIS — R1310 Dysphagia, unspecified: Secondary | ICD-10-CM | POA: Diagnosis not present

## 2018-05-12 DIAGNOSIS — K222 Esophageal obstruction: Secondary | ICD-10-CM | POA: Diagnosis not present

## 2018-05-12 DIAGNOSIS — I672 Cerebral atherosclerosis: Secondary | ICD-10-CM | POA: Diagnosis not present

## 2018-05-12 DIAGNOSIS — R630 Anorexia: Secondary | ICD-10-CM | POA: Diagnosis not present

## 2018-05-16 DIAGNOSIS — I672 Cerebral atherosclerosis: Secondary | ICD-10-CM | POA: Diagnosis not present

## 2018-05-16 DIAGNOSIS — K222 Esophageal obstruction: Secondary | ICD-10-CM | POA: Diagnosis not present

## 2018-05-16 DIAGNOSIS — F0150 Vascular dementia without behavioral disturbance: Secondary | ICD-10-CM | POA: Diagnosis not present

## 2018-05-16 DIAGNOSIS — R1310 Dysphagia, unspecified: Secondary | ICD-10-CM | POA: Diagnosis not present

## 2018-05-16 DIAGNOSIS — J189 Pneumonia, unspecified organism: Secondary | ICD-10-CM | POA: Diagnosis not present

## 2018-05-16 DIAGNOSIS — R630 Anorexia: Secondary | ICD-10-CM | POA: Diagnosis not present

## 2018-05-18 DIAGNOSIS — I672 Cerebral atherosclerosis: Secondary | ICD-10-CM | POA: Diagnosis not present

## 2018-05-18 DIAGNOSIS — R630 Anorexia: Secondary | ICD-10-CM | POA: Diagnosis not present

## 2018-05-18 DIAGNOSIS — K222 Esophageal obstruction: Secondary | ICD-10-CM | POA: Diagnosis not present

## 2018-05-18 DIAGNOSIS — J189 Pneumonia, unspecified organism: Secondary | ICD-10-CM | POA: Diagnosis not present

## 2018-05-18 DIAGNOSIS — R1310 Dysphagia, unspecified: Secondary | ICD-10-CM | POA: Diagnosis not present

## 2018-05-18 DIAGNOSIS — F0150 Vascular dementia without behavioral disturbance: Secondary | ICD-10-CM | POA: Diagnosis not present

## 2018-05-19 DIAGNOSIS — K222 Esophageal obstruction: Secondary | ICD-10-CM | POA: Diagnosis not present

## 2018-05-19 DIAGNOSIS — I672 Cerebral atherosclerosis: Secondary | ICD-10-CM | POA: Diagnosis not present

## 2018-05-19 DIAGNOSIS — J189 Pneumonia, unspecified organism: Secondary | ICD-10-CM | POA: Diagnosis not present

## 2018-05-19 DIAGNOSIS — F0150 Vascular dementia without behavioral disturbance: Secondary | ICD-10-CM | POA: Diagnosis not present

## 2018-05-19 DIAGNOSIS — R630 Anorexia: Secondary | ICD-10-CM | POA: Diagnosis not present

## 2018-05-19 DIAGNOSIS — R1310 Dysphagia, unspecified: Secondary | ICD-10-CM | POA: Diagnosis not present

## 2018-05-23 DIAGNOSIS — J189 Pneumonia, unspecified organism: Secondary | ICD-10-CM | POA: Diagnosis not present

## 2018-05-23 DIAGNOSIS — K222 Esophageal obstruction: Secondary | ICD-10-CM | POA: Diagnosis not present

## 2018-05-23 DIAGNOSIS — R1310 Dysphagia, unspecified: Secondary | ICD-10-CM | POA: Diagnosis not present

## 2018-05-23 DIAGNOSIS — I672 Cerebral atherosclerosis: Secondary | ICD-10-CM | POA: Diagnosis not present

## 2018-05-23 DIAGNOSIS — F0150 Vascular dementia without behavioral disturbance: Secondary | ICD-10-CM | POA: Diagnosis not present

## 2018-05-23 DIAGNOSIS — R630 Anorexia: Secondary | ICD-10-CM | POA: Diagnosis not present

## 2018-05-25 DIAGNOSIS — R630 Anorexia: Secondary | ICD-10-CM | POA: Diagnosis not present

## 2018-05-25 DIAGNOSIS — J189 Pneumonia, unspecified organism: Secondary | ICD-10-CM | POA: Diagnosis not present

## 2018-05-25 DIAGNOSIS — F0150 Vascular dementia without behavioral disturbance: Secondary | ICD-10-CM | POA: Diagnosis not present

## 2018-05-25 DIAGNOSIS — R1310 Dysphagia, unspecified: Secondary | ICD-10-CM | POA: Diagnosis not present

## 2018-05-25 DIAGNOSIS — K222 Esophageal obstruction: Secondary | ICD-10-CM | POA: Diagnosis not present

## 2018-05-25 DIAGNOSIS — I672 Cerebral atherosclerosis: Secondary | ICD-10-CM | POA: Diagnosis not present

## 2018-05-26 DIAGNOSIS — R630 Anorexia: Secondary | ICD-10-CM | POA: Diagnosis not present

## 2018-05-26 DIAGNOSIS — I672 Cerebral atherosclerosis: Secondary | ICD-10-CM | POA: Diagnosis not present

## 2018-05-26 DIAGNOSIS — I739 Peripheral vascular disease, unspecified: Secondary | ICD-10-CM | POA: Diagnosis not present

## 2018-05-26 DIAGNOSIS — E785 Hyperlipidemia, unspecified: Secondary | ICD-10-CM | POA: Diagnosis not present

## 2018-05-26 DIAGNOSIS — R1310 Dysphagia, unspecified: Secondary | ICD-10-CM | POA: Diagnosis not present

## 2018-05-26 DIAGNOSIS — K222 Esophageal obstruction: Secondary | ICD-10-CM | POA: Diagnosis not present

## 2018-05-26 DIAGNOSIS — F339 Major depressive disorder, recurrent, unspecified: Secondary | ICD-10-CM | POA: Diagnosis not present

## 2018-05-26 DIAGNOSIS — G619 Inflammatory polyneuropathy, unspecified: Secondary | ICD-10-CM | POA: Diagnosis not present

## 2018-05-26 DIAGNOSIS — M109 Gout, unspecified: Secondary | ICD-10-CM | POA: Diagnosis not present

## 2018-05-26 DIAGNOSIS — I10 Essential (primary) hypertension: Secondary | ICD-10-CM | POA: Diagnosis not present

## 2018-05-26 DIAGNOSIS — F0150 Vascular dementia without behavioral disturbance: Secondary | ICD-10-CM | POA: Diagnosis not present

## 2018-05-26 DIAGNOSIS — K219 Gastro-esophageal reflux disease without esophagitis: Secondary | ICD-10-CM | POA: Diagnosis not present

## 2018-05-26 DIAGNOSIS — I679 Cerebrovascular disease, unspecified: Secondary | ICD-10-CM | POA: Diagnosis not present

## 2018-05-26 DIAGNOSIS — E1159 Type 2 diabetes mellitus with other circulatory complications: Secondary | ICD-10-CM | POA: Diagnosis not present

## 2018-05-26 DIAGNOSIS — I350 Nonrheumatic aortic (valve) stenosis: Secondary | ICD-10-CM | POA: Diagnosis not present

## 2018-05-26 DIAGNOSIS — J189 Pneumonia, unspecified organism: Secondary | ICD-10-CM | POA: Diagnosis not present

## 2018-05-30 DIAGNOSIS — K222 Esophageal obstruction: Secondary | ICD-10-CM | POA: Diagnosis not present

## 2018-05-30 DIAGNOSIS — F0150 Vascular dementia without behavioral disturbance: Secondary | ICD-10-CM | POA: Diagnosis not present

## 2018-05-30 DIAGNOSIS — R630 Anorexia: Secondary | ICD-10-CM | POA: Diagnosis not present

## 2018-05-30 DIAGNOSIS — J189 Pneumonia, unspecified organism: Secondary | ICD-10-CM | POA: Diagnosis not present

## 2018-05-30 DIAGNOSIS — I672 Cerebral atherosclerosis: Secondary | ICD-10-CM | POA: Diagnosis not present

## 2018-05-30 DIAGNOSIS — R1310 Dysphagia, unspecified: Secondary | ICD-10-CM | POA: Diagnosis not present

## 2018-05-31 DIAGNOSIS — R1310 Dysphagia, unspecified: Secondary | ICD-10-CM | POA: Diagnosis not present

## 2018-05-31 DIAGNOSIS — K222 Esophageal obstruction: Secondary | ICD-10-CM | POA: Diagnosis not present

## 2018-05-31 DIAGNOSIS — I672 Cerebral atherosclerosis: Secondary | ICD-10-CM | POA: Diagnosis not present

## 2018-05-31 DIAGNOSIS — F0150 Vascular dementia without behavioral disturbance: Secondary | ICD-10-CM | POA: Diagnosis not present

## 2018-05-31 DIAGNOSIS — J189 Pneumonia, unspecified organism: Secondary | ICD-10-CM | POA: Diagnosis not present

## 2018-05-31 DIAGNOSIS — R630 Anorexia: Secondary | ICD-10-CM | POA: Diagnosis not present

## 2018-06-01 DIAGNOSIS — K222 Esophageal obstruction: Secondary | ICD-10-CM | POA: Diagnosis not present

## 2018-06-01 DIAGNOSIS — I672 Cerebral atherosclerosis: Secondary | ICD-10-CM | POA: Diagnosis not present

## 2018-06-01 DIAGNOSIS — J189 Pneumonia, unspecified organism: Secondary | ICD-10-CM | POA: Diagnosis not present

## 2018-06-01 DIAGNOSIS — F0150 Vascular dementia without behavioral disturbance: Secondary | ICD-10-CM | POA: Diagnosis not present

## 2018-06-01 DIAGNOSIS — R630 Anorexia: Secondary | ICD-10-CM | POA: Diagnosis not present

## 2018-06-01 DIAGNOSIS — R1310 Dysphagia, unspecified: Secondary | ICD-10-CM | POA: Diagnosis not present

## 2018-06-06 DIAGNOSIS — R1310 Dysphagia, unspecified: Secondary | ICD-10-CM | POA: Diagnosis not present

## 2018-06-06 DIAGNOSIS — R296 Repeated falls: Secondary | ICD-10-CM | POA: Diagnosis not present

## 2018-06-06 DIAGNOSIS — F0150 Vascular dementia without behavioral disturbance: Secondary | ICD-10-CM | POA: Diagnosis not present

## 2018-06-06 DIAGNOSIS — R630 Anorexia: Secondary | ICD-10-CM | POA: Diagnosis not present

## 2018-06-06 DIAGNOSIS — I10 Essential (primary) hypertension: Secondary | ICD-10-CM | POA: Diagnosis not present

## 2018-06-06 DIAGNOSIS — K222 Esophageal obstruction: Secondary | ICD-10-CM | POA: Diagnosis not present

## 2018-06-06 DIAGNOSIS — I672 Cerebral atherosclerosis: Secondary | ICD-10-CM | POA: Diagnosis not present

## 2018-06-06 DIAGNOSIS — J189 Pneumonia, unspecified organism: Secondary | ICD-10-CM | POA: Diagnosis not present

## 2018-06-08 DIAGNOSIS — J189 Pneumonia, unspecified organism: Secondary | ICD-10-CM | POA: Diagnosis not present

## 2018-06-08 DIAGNOSIS — F0150 Vascular dementia without behavioral disturbance: Secondary | ICD-10-CM | POA: Diagnosis not present

## 2018-06-08 DIAGNOSIS — I672 Cerebral atherosclerosis: Secondary | ICD-10-CM | POA: Diagnosis not present

## 2018-06-08 DIAGNOSIS — K222 Esophageal obstruction: Secondary | ICD-10-CM | POA: Diagnosis not present

## 2018-06-08 DIAGNOSIS — R1310 Dysphagia, unspecified: Secondary | ICD-10-CM | POA: Diagnosis not present

## 2018-06-08 DIAGNOSIS — R630 Anorexia: Secondary | ICD-10-CM | POA: Diagnosis not present

## 2018-06-13 DIAGNOSIS — K222 Esophageal obstruction: Secondary | ICD-10-CM | POA: Diagnosis not present

## 2018-06-13 DIAGNOSIS — F0150 Vascular dementia without behavioral disturbance: Secondary | ICD-10-CM | POA: Diagnosis not present

## 2018-06-13 DIAGNOSIS — R1310 Dysphagia, unspecified: Secondary | ICD-10-CM | POA: Diagnosis not present

## 2018-06-13 DIAGNOSIS — J189 Pneumonia, unspecified organism: Secondary | ICD-10-CM | POA: Diagnosis not present

## 2018-06-13 DIAGNOSIS — I672 Cerebral atherosclerosis: Secondary | ICD-10-CM | POA: Diagnosis not present

## 2018-06-13 DIAGNOSIS — R630 Anorexia: Secondary | ICD-10-CM | POA: Diagnosis not present

## 2018-06-14 DIAGNOSIS — K222 Esophageal obstruction: Secondary | ICD-10-CM | POA: Diagnosis not present

## 2018-06-14 DIAGNOSIS — K219 Gastro-esophageal reflux disease without esophagitis: Secondary | ICD-10-CM | POA: Diagnosis not present

## 2018-06-14 DIAGNOSIS — N400 Enlarged prostate without lower urinary tract symptoms: Secondary | ICD-10-CM | POA: Diagnosis not present

## 2018-06-14 DIAGNOSIS — G894 Chronic pain syndrome: Secondary | ICD-10-CM | POA: Diagnosis not present

## 2018-06-14 DIAGNOSIS — R1310 Dysphagia, unspecified: Secondary | ICD-10-CM | POA: Diagnosis not present

## 2018-06-14 DIAGNOSIS — I10 Essential (primary) hypertension: Secondary | ICD-10-CM | POA: Diagnosis not present

## 2018-06-14 DIAGNOSIS — I69891 Dysphagia following other cerebrovascular disease: Secondary | ICD-10-CM | POA: Diagnosis not present

## 2018-06-14 DIAGNOSIS — C8100 Nodular lymphocyte predominant Hodgkin lymphoma, unspecified site: Secondary | ICD-10-CM | POA: Diagnosis not present

## 2018-06-14 DIAGNOSIS — F0150 Vascular dementia without behavioral disturbance: Secondary | ICD-10-CM | POA: Diagnosis not present

## 2018-06-14 DIAGNOSIS — N183 Chronic kidney disease, stage 3 (moderate): Secondary | ICD-10-CM | POA: Diagnosis not present

## 2018-06-14 DIAGNOSIS — H3530 Unspecified macular degeneration: Secondary | ICD-10-CM | POA: Diagnosis not present

## 2018-06-14 DIAGNOSIS — R630 Anorexia: Secondary | ICD-10-CM | POA: Diagnosis not present

## 2018-06-14 DIAGNOSIS — J189 Pneumonia, unspecified organism: Secondary | ICD-10-CM | POA: Diagnosis not present

## 2018-06-14 DIAGNOSIS — R296 Repeated falls: Secondary | ICD-10-CM | POA: Diagnosis not present

## 2018-06-14 DIAGNOSIS — I672 Cerebral atherosclerosis: Secondary | ICD-10-CM | POA: Diagnosis not present

## 2018-06-14 DIAGNOSIS — M6281 Muscle weakness (generalized): Secondary | ICD-10-CM | POA: Diagnosis not present

## 2018-06-14 DIAGNOSIS — J9600 Acute respiratory failure, unspecified whether with hypoxia or hypercapnia: Secondary | ICD-10-CM | POA: Diagnosis not present

## 2018-06-14 DIAGNOSIS — G9341 Metabolic encephalopathy: Secondary | ICD-10-CM | POA: Diagnosis not present

## 2018-06-15 DIAGNOSIS — R1310 Dysphagia, unspecified: Secondary | ICD-10-CM | POA: Diagnosis not present

## 2018-06-15 DIAGNOSIS — K222 Esophageal obstruction: Secondary | ICD-10-CM | POA: Diagnosis not present

## 2018-06-15 DIAGNOSIS — R630 Anorexia: Secondary | ICD-10-CM | POA: Diagnosis not present

## 2018-06-15 DIAGNOSIS — J189 Pneumonia, unspecified organism: Secondary | ICD-10-CM | POA: Diagnosis not present

## 2018-06-15 DIAGNOSIS — F0150 Vascular dementia without behavioral disturbance: Secondary | ICD-10-CM | POA: Diagnosis not present

## 2018-06-15 DIAGNOSIS — I672 Cerebral atherosclerosis: Secondary | ICD-10-CM | POA: Diagnosis not present

## 2018-06-16 DIAGNOSIS — R1310 Dysphagia, unspecified: Secondary | ICD-10-CM | POA: Diagnosis not present

## 2018-06-16 DIAGNOSIS — J189 Pneumonia, unspecified organism: Secondary | ICD-10-CM | POA: Diagnosis not present

## 2018-06-16 DIAGNOSIS — I672 Cerebral atherosclerosis: Secondary | ICD-10-CM | POA: Diagnosis not present

## 2018-06-16 DIAGNOSIS — K222 Esophageal obstruction: Secondary | ICD-10-CM | POA: Diagnosis not present

## 2018-06-16 DIAGNOSIS — R630 Anorexia: Secondary | ICD-10-CM | POA: Diagnosis not present

## 2018-06-16 DIAGNOSIS — F0150 Vascular dementia without behavioral disturbance: Secondary | ICD-10-CM | POA: Diagnosis not present

## 2018-06-20 DIAGNOSIS — R1310 Dysphagia, unspecified: Secondary | ICD-10-CM | POA: Diagnosis not present

## 2018-06-20 DIAGNOSIS — F0150 Vascular dementia without behavioral disturbance: Secondary | ICD-10-CM | POA: Diagnosis not present

## 2018-06-20 DIAGNOSIS — R630 Anorexia: Secondary | ICD-10-CM | POA: Diagnosis not present

## 2018-06-20 DIAGNOSIS — K222 Esophageal obstruction: Secondary | ICD-10-CM | POA: Diagnosis not present

## 2018-06-20 DIAGNOSIS — I672 Cerebral atherosclerosis: Secondary | ICD-10-CM | POA: Diagnosis not present

## 2018-06-20 DIAGNOSIS — J189 Pneumonia, unspecified organism: Secondary | ICD-10-CM | POA: Diagnosis not present

## 2018-06-21 DIAGNOSIS — L89152 Pressure ulcer of sacral region, stage 2: Secondary | ICD-10-CM | POA: Diagnosis not present

## 2018-06-21 DIAGNOSIS — F0150 Vascular dementia without behavioral disturbance: Secondary | ICD-10-CM | POA: Diagnosis not present

## 2018-06-21 DIAGNOSIS — J189 Pneumonia, unspecified organism: Secondary | ICD-10-CM | POA: Diagnosis not present

## 2018-06-21 DIAGNOSIS — K222 Esophageal obstruction: Secondary | ICD-10-CM | POA: Diagnosis not present

## 2018-06-21 DIAGNOSIS — R1310 Dysphagia, unspecified: Secondary | ICD-10-CM | POA: Diagnosis not present

## 2018-06-21 DIAGNOSIS — R627 Adult failure to thrive: Secondary | ICD-10-CM | POA: Diagnosis not present

## 2018-06-21 DIAGNOSIS — F0151 Vascular dementia with behavioral disturbance: Secondary | ICD-10-CM | POA: Diagnosis not present

## 2018-06-21 DIAGNOSIS — R630 Anorexia: Secondary | ICD-10-CM | POA: Diagnosis not present

## 2018-06-21 DIAGNOSIS — F064 Anxiety disorder due to known physiological condition: Secondary | ICD-10-CM | POA: Diagnosis not present

## 2018-06-21 DIAGNOSIS — I672 Cerebral atherosclerosis: Secondary | ICD-10-CM | POA: Diagnosis not present

## 2018-06-21 DIAGNOSIS — R451 Restlessness and agitation: Secondary | ICD-10-CM | POA: Diagnosis not present

## 2018-06-21 DIAGNOSIS — K5900 Constipation, unspecified: Secondary | ICD-10-CM | POA: Diagnosis not present

## 2018-06-23 DIAGNOSIS — R1310 Dysphagia, unspecified: Secondary | ICD-10-CM | POA: Diagnosis not present

## 2018-06-23 DIAGNOSIS — J189 Pneumonia, unspecified organism: Secondary | ICD-10-CM | POA: Diagnosis not present

## 2018-06-23 DIAGNOSIS — F0150 Vascular dementia without behavioral disturbance: Secondary | ICD-10-CM | POA: Diagnosis not present

## 2018-06-23 DIAGNOSIS — R630 Anorexia: Secondary | ICD-10-CM | POA: Diagnosis not present

## 2018-06-23 DIAGNOSIS — K222 Esophageal obstruction: Secondary | ICD-10-CM | POA: Diagnosis not present

## 2018-06-23 DIAGNOSIS — I672 Cerebral atherosclerosis: Secondary | ICD-10-CM | POA: Diagnosis not present

## 2018-06-24 DIAGNOSIS — N183 Chronic kidney disease, stage 3 (moderate): Secondary | ICD-10-CM | POA: Diagnosis not present

## 2018-06-24 DIAGNOSIS — F0150 Vascular dementia without behavioral disturbance: Secondary | ICD-10-CM | POA: Diagnosis not present

## 2018-06-24 DIAGNOSIS — K222 Esophageal obstruction: Secondary | ICD-10-CM | POA: Diagnosis not present

## 2018-06-24 DIAGNOSIS — I10 Essential (primary) hypertension: Secondary | ICD-10-CM | POA: Diagnosis not present

## 2018-06-24 DIAGNOSIS — R1310 Dysphagia, unspecified: Secondary | ICD-10-CM | POA: Diagnosis not present

## 2018-06-24 DIAGNOSIS — R630 Anorexia: Secondary | ICD-10-CM | POA: Diagnosis not present

## 2018-06-24 DIAGNOSIS — J189 Pneumonia, unspecified organism: Secondary | ICD-10-CM | POA: Diagnosis not present

## 2018-06-24 DIAGNOSIS — I672 Cerebral atherosclerosis: Secondary | ICD-10-CM | POA: Diagnosis not present

## 2018-06-25 DIAGNOSIS — R1310 Dysphagia, unspecified: Secondary | ICD-10-CM | POA: Diagnosis not present

## 2018-06-25 DIAGNOSIS — M109 Gout, unspecified: Secondary | ICD-10-CM | POA: Diagnosis not present

## 2018-06-25 DIAGNOSIS — J690 Pneumonitis due to inhalation of food and vomit: Secondary | ICD-10-CM | POA: Diagnosis not present

## 2018-06-25 DIAGNOSIS — F339 Major depressive disorder, recurrent, unspecified: Secondary | ICD-10-CM | POA: Diagnosis not present

## 2018-06-25 DIAGNOSIS — I350 Nonrheumatic aortic (valve) stenosis: Secondary | ICD-10-CM | POA: Diagnosis not present

## 2018-06-25 DIAGNOSIS — K222 Esophageal obstruction: Secondary | ICD-10-CM | POA: Diagnosis not present

## 2018-06-25 DIAGNOSIS — E43 Unspecified severe protein-calorie malnutrition: Secondary | ICD-10-CM | POA: Diagnosis not present

## 2018-06-25 DIAGNOSIS — E1159 Type 2 diabetes mellitus with other circulatory complications: Secondary | ICD-10-CM | POA: Diagnosis not present

## 2018-06-25 DIAGNOSIS — I739 Peripheral vascular disease, unspecified: Secondary | ICD-10-CM | POA: Diagnosis not present

## 2018-06-25 DIAGNOSIS — I672 Cerebral atherosclerosis: Secondary | ICD-10-CM | POA: Diagnosis not present

## 2018-06-25 DIAGNOSIS — F0150 Vascular dementia without behavioral disturbance: Secondary | ICD-10-CM | POA: Diagnosis not present

## 2018-06-25 DIAGNOSIS — G619 Inflammatory polyneuropathy, unspecified: Secondary | ICD-10-CM | POA: Diagnosis not present

## 2018-06-25 DIAGNOSIS — K219 Gastro-esophageal reflux disease without esophagitis: Secondary | ICD-10-CM | POA: Diagnosis not present

## 2018-06-25 DIAGNOSIS — I10 Essential (primary) hypertension: Secondary | ICD-10-CM | POA: Diagnosis not present

## 2018-06-25 DIAGNOSIS — E785 Hyperlipidemia, unspecified: Secondary | ICD-10-CM | POA: Diagnosis not present

## 2018-06-25 DIAGNOSIS — I679 Cerebrovascular disease, unspecified: Secondary | ICD-10-CM | POA: Diagnosis not present

## 2018-06-25 DIAGNOSIS — R630 Anorexia: Secondary | ICD-10-CM | POA: Diagnosis not present

## 2018-07-26 DEATH — deceased
